# Patient Record
Sex: Male | Born: 1960 | Race: White | Hispanic: No | Marital: Single | Smoking: Never smoker
Health system: Southern US, Community
[De-identification: ages and names within clinical notes are randomized; demographics above are authoritative.]

## PROBLEM LIST (undated history)

## (undated) DIAGNOSIS — R601 Generalized edema: Secondary | ICD-10-CM

## (undated) DIAGNOSIS — N200 Calculus of kidney: Secondary | ICD-10-CM

## (undated) DIAGNOSIS — Z951 Presence of aortocoronary bypass graft: Secondary | ICD-10-CM

## (undated) DIAGNOSIS — M1990 Unspecified osteoarthritis, unspecified site: Secondary | ICD-10-CM

## (undated) DIAGNOSIS — K219 Gastro-esophageal reflux disease without esophagitis: Secondary | ICD-10-CM

## (undated) DIAGNOSIS — I2510 Atherosclerotic heart disease of native coronary artery without angina pectoris: Secondary | ICD-10-CM

## (undated) DIAGNOSIS — I509 Heart failure, unspecified: Secondary | ICD-10-CM

## (undated) DIAGNOSIS — I10 Essential (primary) hypertension: Secondary | ICD-10-CM

## (undated) DIAGNOSIS — I214 Non-ST elevation (NSTEMI) myocardial infarction: Secondary | ICD-10-CM

## (undated) DIAGNOSIS — E669 Obesity, unspecified: Secondary | ICD-10-CM

---

## 2013-04-06 DIAGNOSIS — I214 Non-ST elevation (NSTEMI) myocardial infarction: Secondary | ICD-10-CM

## 2013-04-06 DIAGNOSIS — I10 Essential (primary) hypertension: Secondary | ICD-10-CM | POA: Diagnosis present

## 2013-04-06 HISTORY — DX: Essential (primary) hypertension: I10

## 2013-04-06 HISTORY — DX: Non-ST elevation (NSTEMI) myocardial infarction: I21.4

## 2013-04-07 ENCOUNTER — Other Ambulatory Visit: Payer: Self-pay | Admitting: *Deleted

## 2013-04-07 ENCOUNTER — Inpatient Hospital Stay: Payer: Self-pay | Admitting: Internal Medicine

## 2013-04-07 ENCOUNTER — Encounter (HOSPITAL_COMMUNITY): Payer: Self-pay | Admitting: Physician Assistant

## 2013-04-07 ENCOUNTER — Inpatient Hospital Stay (HOSPITAL_COMMUNITY)
Admission: AD | Admit: 2013-04-07 | Discharge: 2013-04-13 | DRG: 235 | Disposition: A | Discharge status: Home / Self Care | Payer: Self-pay | Source: Other Acute Inpatient Hospital | Attending: Thoracic Surgery (Cardiothoracic Vascular Surgery) | Admitting: Thoracic Surgery (Cardiothoracic Vascular Surgery)

## 2013-04-07 ENCOUNTER — Inpatient Hospital Stay (HOSPITAL_COMMUNITY): Payer: Self-pay

## 2013-04-07 DIAGNOSIS — E785 Hyperlipidemia, unspecified: Secondary | ICD-10-CM | POA: Diagnosis present

## 2013-04-07 DIAGNOSIS — R945 Abnormal results of liver function studies: Secondary | ICD-10-CM

## 2013-04-07 DIAGNOSIS — Z8249 Family history of ischemic heart disease and other diseases of the circulatory system: Secondary | ICD-10-CM

## 2013-04-07 DIAGNOSIS — I2510 Atherosclerotic heart disease of native coronary artery without angina pectoris: Secondary | ICD-10-CM

## 2013-04-07 DIAGNOSIS — I4729 Other ventricular tachycardia: Secondary | ICD-10-CM | POA: Diagnosis not present

## 2013-04-07 DIAGNOSIS — I10 Essential (primary) hypertension: Secondary | ICD-10-CM | POA: Diagnosis present

## 2013-04-07 DIAGNOSIS — I472 Ventricular tachycardia, unspecified: Secondary | ICD-10-CM | POA: Diagnosis not present

## 2013-04-07 DIAGNOSIS — R7989 Other specified abnormal findings of blood chemistry: Secondary | ICD-10-CM

## 2013-04-07 DIAGNOSIS — Z6831 Body mass index (BMI) 31.0-31.9, adult: Secondary | ICD-10-CM

## 2013-04-07 DIAGNOSIS — J9819 Other pulmonary collapse: Secondary | ICD-10-CM | POA: Diagnosis not present

## 2013-04-07 DIAGNOSIS — K219 Gastro-esophageal reflux disease without esophagitis: Secondary | ICD-10-CM | POA: Diagnosis present

## 2013-04-07 DIAGNOSIS — M129 Arthropathy, unspecified: Secondary | ICD-10-CM | POA: Diagnosis present

## 2013-04-07 DIAGNOSIS — N200 Calculus of kidney: Secondary | ICD-10-CM | POA: Insufficient documentation

## 2013-04-07 DIAGNOSIS — D62 Acute posthemorrhagic anemia: Secondary | ICD-10-CM | POA: Diagnosis not present

## 2013-04-07 DIAGNOSIS — I214 Non-ST elevation (NSTEMI) myocardial infarction: Secondary | ICD-10-CM | POA: Diagnosis present

## 2013-04-07 DIAGNOSIS — Z951 Presence of aortocoronary bypass graft: Secondary | ICD-10-CM

## 2013-04-07 DIAGNOSIS — E6609 Other obesity due to excess calories: Secondary | ICD-10-CM | POA: Diagnosis present

## 2013-04-07 DIAGNOSIS — E669 Obesity, unspecified: Secondary | ICD-10-CM | POA: Diagnosis present

## 2013-04-07 HISTORY — DX: Unspecified osteoarthritis, unspecified site: M19.90

## 2013-04-07 HISTORY — DX: Calculus of kidney: N20.0

## 2013-04-07 HISTORY — DX: Obesity, unspecified: E66.9

## 2013-04-07 HISTORY — DX: Non-ST elevation (NSTEMI) myocardial infarction: I21.4

## 2013-04-07 HISTORY — DX: Gastro-esophageal reflux disease without esophagitis: K21.9

## 2013-04-07 HISTORY — DX: Atherosclerotic heart disease of native coronary artery without angina pectoris: I25.10

## 2013-04-07 HISTORY — DX: Presence of aortocoronary bypass graft: Z95.1

## 2013-04-07 HISTORY — DX: Essential (primary) hypertension: I10

## 2013-04-07 LAB — COMPREHENSIVE METABOLIC PANEL
ALBUMIN: 3.8 g/dL (ref 3.5–5.2)
ALT: 67 U/L — ABNORMAL HIGH (ref 0–53)
AST: 36 U/L (ref 15–37)
AST: 368 U/L — AB (ref 0–37)
Albumin: 4.2 g/dL (ref 3.4–5.0)
Alkaline Phosphatase: 75 U/L
Alkaline Phosphatase: 58 U/L (ref 39–117)
Anion Gap: 4 — ABNORMAL LOW (ref 7–16)
BILIRUBIN TOTAL: 0.6 mg/dL (ref 0.2–1.0)
BUN: 18 mg/dL (ref 7–18)
BUN: 10 mg/dL (ref 6–23)
CHLORIDE: 102 meq/L (ref 96–112)
CO2: 24 mEq/L (ref 19–32)
CREATININE: 1.06 mg/dL (ref 0.50–1.35)
Calcium, Total: 9.3 mg/dL (ref 8.5–10.1)
Calcium: 9.2 mg/dL (ref 8.4–10.5)
Chloride: 105 mmol/L (ref 98–107)
Co2: 28 mmol/L (ref 21–32)
Creatinine: 1.17 mg/dL (ref 0.60–1.30)
EGFR (African American): >60
EGFR (Non-African Amer.): >60
GFR calc Af Amer: >90 mL/min (ref >90)
GFR calc non Af Amer: 79 mL/min — ABNORMAL LOW (ref >90)
GLUCOSE: 133 mg/dL — AB (ref 65–99)
Glucose, Bld: 100 mg/dL — ABNORMAL HIGH (ref 70–99)
Osmolality: 278 (ref 275–301)
POTASSIUM: 3.8 mmol/L (ref 3.5–5.1)
POTASSIUM: 4.1 meq/L (ref 3.7–5.3)
SGPT (ALT): 31 U/L (ref 12–78)
Sodium: 137 mmol/L (ref 136–145)
Sodium: 140 mEq/L (ref 137–147)
Total Bilirubin: 0.7 mg/dL (ref 0.3–1.2)
Total Protein: 8 g/dL (ref 6.4–8.2)
Total Protein: 6.7 g/dL (ref 6.0–8.3)

## 2013-04-07 LAB — CK-MB
CK-MB: 12.9 ng/mL — ABNORMAL HIGH (ref 0.5–3.6)
CK-MB: 378 ng/mL — ABNORMAL HIGH (ref 0.5–3.6)
CK-MB: 521.4 ng/mL — ABNORMAL HIGH (ref 0.5–3.6)

## 2013-04-07 LAB — URINALYSIS, ROUTINE W REFLEX MICROSCOPIC
BILIRUBIN URINE: NEGATIVE
GLUCOSE, UA: NEGATIVE mg/dL
HGB URINE DIPSTICK: NEGATIVE
Ketones, ur: NEGATIVE mg/dL
Leukocytes, UA: NEGATIVE
Nitrite: NEGATIVE
PROTEIN: NEGATIVE mg/dL
Specific Gravity, Urine: 1.036 — ABNORMAL HIGH (ref 1.005–1.030)
UROBILINOGEN UA: 0.2 mg/dL (ref 0.0–1.0)
pH: 5.5 (ref 5.0–8.0)

## 2013-04-07 LAB — CBC WITH DIFFERENTIAL/PLATELET
BASOS PCT: 0.6 %
Basophil #: 0.1 10*3/uL (ref 0.0–0.1)
Eosinophil #: 0 10*3/uL (ref 0.0–0.7)
Eosinophil %: 0.4 %
HCT: 47.1 % (ref 40.0–52.0)
HGB: 15.9 g/dL (ref 13.0–18.0)
Lymphocyte #: 1.6 10*3/uL (ref 1.0–3.6)
Lymphocyte %: 14.3 %
MCH: 28.4 pg (ref 26.0–34.0)
MCHC: 33.7 g/dL (ref 32.0–36.0)
MCV: 84 fL (ref 80–100)
MONO ABS: 0.6 x10 3/mm (ref 0.2–1.0)
MONOS PCT: 5.8 %
Neutrophil #: 8.7 10*3/uL — ABNORMAL HIGH (ref 1.4–6.5)
Neutrophil %: 78.9 %
Platelet: 228 10*3/uL (ref 150–440)
RBC: 5.59 10*6/uL (ref 4.40–5.90)
RDW: 13.7 % (ref 11.5–14.5)
WBC: 11 10*3/uL — ABNORMAL HIGH (ref 3.8–10.6)

## 2013-04-07 LAB — CBC
HCT: 50.7 % (ref 40.0–52.0)
HEMATOCRIT: 43.2 % (ref 39.0–52.0)
HGB: 17.2 g/dL (ref 13.0–18.0)
Hemoglobin: 14.9 g/dL (ref 13.0–17.0)
MCH: 28.8 pg (ref 26.0–34.0)
MCH: 29.2 pg (ref 26.0–34.0)
MCHC: 33.9 g/dL (ref 32.0–36.0)
MCHC: 34.5 g/dL (ref 30.0–36.0)
MCV: 85 fL (ref 80–100)
MCV: 84.7 fL (ref 78.0–100.0)
PLATELETS: 190 10*3/uL (ref 150–400)
Platelet: 251 10*3/uL (ref 150–440)
RBC: 5.97 10*6/uL — ABNORMAL HIGH (ref 4.40–5.90)
RBC: 5.1 MIL/uL (ref 4.22–5.81)
RDW: 13.7 % (ref 11.5–14.5)
RDW: 13.5 % (ref 11.5–15.5)
WBC: 9.1 10*3/uL (ref 3.8–10.6)
WBC: 9.4 10*3/uL (ref 4.0–10.5)

## 2013-04-07 LAB — APTT
APTT: 81 s — AB (ref 24–37)
Activated PTT: 75.9 secs — ABNORMAL HIGH (ref 23.6–35.9)

## 2013-04-07 LAB — PROTIME-INR
INR: 1.07 (ref 0.00–1.49)
PROTHROMBIN TIME: 13.7 s (ref 11.6–15.2)

## 2013-04-07 LAB — TROPONIN I: TROPONIN-I: 1.1 ng/mL — AB

## 2013-04-07 LAB — HEPARIN LEVEL (UNFRACTIONATED): Heparin Unfractionated: 0.36 IU/mL (ref 0.30–0.70)

## 2013-04-07 LAB — GLUCOSE, CAPILLARY: Glucose-Capillary: 128 mg/dL — ABNORMAL HIGH (ref 70–99)

## 2013-04-07 MED ORDER — LABETALOL HCL 5 MG/ML IV SOLN
10.0000 mg | INTRAVENOUS | Status: DC | PRN
  Filled 2013-04-07: qty 4

## 2013-04-07 MED ORDER — SODIUM CHLORIDE 0.9 % IV SOLN: 250.0000 mL | INTRAVENOUS | Status: DC | PRN

## 2013-04-07 MED ORDER — ASPIRIN EC 325 MG PO TBEC
325.0000 mg | DELAYED_RELEASE_TABLET | Freq: Every day | ORAL | Status: DC
Start: 2013-04-08 — End: 2013-04-09
  Administered 2013-04-08: 325 mg via ORAL
  Filled 2013-04-07 (×2): qty 1

## 2013-04-07 MED ORDER — ACETAMINOPHEN 325 MG PO TABS
650.0000 mg | ORAL_TABLET | ORAL | Status: DC | PRN
  Administered 2013-04-08: 650 mg via ORAL
  Filled 2013-04-07: qty 2

## 2013-04-07 MED ORDER — ONDANSETRON HCL 4 MG/2ML IJ SOLN: 4.0000 mg | Freq: Four times a day (QID) | INTRAMUSCULAR | Status: DC | PRN

## 2013-04-07 MED ORDER — SODIUM CHLORIDE 0.9 % IJ SOLN: 3.0000 mL | INTRAMUSCULAR | Status: DC | PRN

## 2013-04-07 MED ORDER — NITROGLYCERIN 0.4 MG SL SUBL: 0.4000 mg | SUBLINGUAL_TABLET | SUBLINGUAL | Status: DC | PRN

## 2013-04-07 MED ORDER — ASPIRIN 300 MG RE SUPP
300.0000 mg | RECTAL | Status: DC
  Filled 2013-04-07: qty 1

## 2013-04-07 MED ORDER — ASPIRIN 81 MG PO CHEW: 324.0000 mg | CHEWABLE_TABLET | ORAL | Status: DC

## 2013-04-07 MED ORDER — METOPROLOL TARTRATE 25 MG PO TABS
25.0000 mg | ORAL_TABLET | Freq: Two times a day (BID) | ORAL | Status: DC
  Administered 2013-04-07 – 2013-04-08 (×3): 25 mg via ORAL
  Filled 2013-04-07 (×5): qty 1

## 2013-04-07 MED ORDER — INSULIN ASPART 100 UNIT/ML ~~LOC~~ SOLN: 0.0000 [IU] | Freq: Three times a day (TID) | SUBCUTANEOUS | Status: DC

## 2013-04-07 MED ORDER — ALPRAZOLAM 0.25 MG PO TABS
0.2500 mg | ORAL_TABLET | Freq: Two times a day (BID) | ORAL | Status: DC | PRN
  Administered 2013-04-07: 0.25 mg via ORAL
  Filled 2013-04-07: qty 1

## 2013-04-07 MED ORDER — SODIUM CHLORIDE 0.9 % IJ SOLN
3.0000 mL | Freq: Two times a day (BID) | INTRAMUSCULAR | Status: DC
  Administered 2013-04-07 – 2013-04-08 (×2): 3 mL via INTRAVENOUS

## 2013-04-07 MED ORDER — HEPARIN (PORCINE) IN NACL 100-0.45 UNIT/ML-% IJ SOLN
1550.0000 [IU]/h | INTRAMUSCULAR | Status: DC
  Administered 2013-04-07 – 2013-04-08 (×2): 1550 [IU]/h via INTRAVENOUS
  Filled 2013-04-07 (×2): qty 250

## 2013-04-07 MED ORDER — SIMVASTATIN 20 MG PO TABS
20.0000 mg | ORAL_TABLET | Freq: Every day | ORAL | Status: DC
  Administered 2013-04-07: 20 mg via ORAL
  Filled 2013-04-07 (×2): qty 1

## 2013-04-07 NOTE — Progress Notes (Signed)
ANTICOAGULATION CONSULT NOTE - Initial Consult  Pharmacy Consult for heparin Indication: chest pain/ACS  No Known Allergies  Patient Measurements: Height: 6\' 1"  (185.4 cm) Weight: 235 lb 10.8 oz (106.9 kg) IBW/kg (Calculated) : 79.9 Heparin Dosing Weight: 102 kg  Vital Signs: Temp: 98.7 F (37.1 C) (01/14 1530) Temp src: Oral (01/14 1530) BP: 148/94 mmHg (01/14 1530) Pulse Rate: 83 (01/14 1530)  Labs: No results found for this basename: HGB, HCT, PLT, APTT, LABPROT, INR, HEPARINUNFRC, CREATININE, CKTOTAL, CKMB, TROPONINI,  in the last 72 hours  CrCl is unknown because no creatinine reading has been taken.   Medical History: Past Medical History  Diagnosis Date  . Acid reflux   . Kidney stone   . NSTEMI (non-ST elevated myocardial infarction)   . Arthritis     SHOULDERS  . Coronary artery disease 04/07/2013    cath by Dr. Lady Gary at [facility]  . Hypertension   . Non-STEMI (non-ST elevated myocardial infarction) 04/06/2013  . Essential hypertension, benign 04/06/2013  . Obesity, unspecified 04/07/2013    Medications:  Prescriptions prior to admission  Medication Sig Dispense Refill  . Aspirin-Salicylamide-Caffeine (BC HEADACHE POWDER PO) Take 1 each by mouth as needed.        Assessment: Pt with NSTEMI - transferred from [facility].  Heparin was started there and was continued upon transfer.  Heparin currently running at 15.6 ml/hr.  Pertinent labs from [facility]: Hgb 17.2, plts 251 trop: 1.1. INR pending. No bleeding noted. No anticoagulants prior to admission.  Goal of Therapy:  Heparin level 0.3-0.7 units/ml Monitor platelets by anticoagulation protocol: Yes   Plan:  Adjust heparin to 1550 units/hr  Will draw HL now and adjust as needed Daily HL Daily CBC  Agapito Games, PharmD, BCPS Clinical Pharmacist 04/07/2013 6:25 PM

## 2013-04-07 NOTE — H&P (Addendum)
[address]       [place] [address]             [phone]        Auri Mehrer [facility] Medical Record #[id number] Date of Birth: Aug 03, 1960  Referring: Dr. Mariel Kansky Primary Care: Provider Not In System  Chief Complaint:   Chest Pain    History of Present Illness:     53 year old caucasian male from [place] with a past medical history of hypertension, acid reflux and obesity. He has not been seen by a physician since he was 18. He presented to the ER with chest discomfort for the past 2 weeks associated with shortness of breath. The patient thought that it was just his acid reflux. He noticed the pain after eating initially, but he developed a prolonged episode of chest pain yesterday evening which persisted more than on hour. He denies any associated nausea, diaphoresis or shortness of breath.  The patient took a handful of Rolaids tablets and one BC powder before going to the ER. In the ER the patients BP was 223/130, pulse 64. He was given labetalol 20mg  IV. Initial EKG was fine but subsequent EKGs revealed ST depression in the V leads. At one point the patients EKG showed nonsustained wide complex tachycardia. Troponins were positive. He was diagnosed with a NSTEMI and the patient was given a heparin bolus and started on heparin gtt. After starting a nitro drip and a heparin drip the patients BP began to trend down. He reports no further episodes of chest pain since admission.  He underwent diagnostic cardiac catheterization earlier today by Dr. Lady Gary which revealed severe 3-vessel CAD with preserved left ventricular systolic function.  He was transferred to [facility] for possible elective surgical revascularization.  On arrival at [facility] he denies any chest pain, SOB, headache, blurry vision, and double vision. No family members at bedside.  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 The patient has been out of work recently. Prior to the development of his acute illness the patient reports no physical limitations. He denies any previous history of exertional chest discomfort or shortness of breath. He reports no history of PND, orthopnea, lower extremity edema, palpitations, dizzy spells, nor syncope.  Current Activity/ Functional Status: Patient is independent with mobility/ambulation, transfers, ADL's, IADL's.   Zubrod Score: At the time of surgery this patient's most appropriate activity status/level should be described as: []  Normal activity, no symptoms [x]  Symptoms, fully ambulatory []  Symptoms, in bed less than or equal to 50% of the time []  Symptoms, in bed greater than 50% of the time but less than 100% []  Bedridden []  Moribund  Past Medical History  Diagnosis Date  . Acid reflux   . Kidney stone   . Coronary artery disease   . NSTEMI (non-ST elevated myocardial infarction)     No past surgical history on file.  History  Smoking status  . Never Smoker   Smokeless tobacco  . Not on file    History  Alcohol Use: Not on file    History   Social History  . Marital Status: Unknown    Spouse Name: N/A    Number of Children: N/A  . Years of Education: N/A   Occupational History  . house remodeler     Social History Main Topics  . Smoking status: Never Smoker   .  Smokeless tobacco: Not on file  . Alcohol Use: Not on file  . Drug Use: Not on file  . Sexual Activity: Not on file   Other Topics Concern  . Not on file   Social History Narrative  . No narrative on file    Not on File  No current facility-administered medications for this encounter.    No prescriptions prior to admission    Family History  Problem Relation Age of Onset  . Cancer Mother   . Coronary artery disease Father   . Hypertension Father   . Heart attack Paternal Uncle      Review of Systems:     Cardiac Review of Systems: Y or  N  Chest Pain [  Y  ]  Resting SOB [ N  ] Exertional SOB  [ Y  ]  Orthopnea [  ]   Pedal Edema [   ]    Palpitations [ N ] Syncope  [N  ]   Presyncope [   ]  General Review of Systems: [Y] = yes [  ]=no Constitional: recent weight change [  ]; anorexia [ N ]; fatigue [ N ]; nausea [  ]; night sweats [ N ]; fever [ N ]; or chills [  ]                                                               Dental: poor dentition[  ]; Last Dentist visit:   Eye : blurred vision [ N ]; diplopia [ N  ]; vision changes Klaus.Mock[N  ];  Amaurosis fugax[ N ]; Resp: cough [  ];  wheezing[  ];  hemoptysis[  ]; shortness of breath[ Y ]; paroxysmal nocturnal dyspnea[  ]; dyspnea on exertion[  ]; or orthopnea[  ];  GI:  gallstones[  ], vomiting[  ];  dysphagia[  ]; melena[  ];  hematochezia [  ]; heartburn[ Y ];   Hx of  Colonoscopy[  ]; GU: kidney stones [  ]; hematuria[  ];   dysuria [  ];  nocturia[  ];  history of     obstruction [  ]; urinary frequency [  ]             Skin: rash, swelling[  ];, hair loss[  ];  peripheral edema[  ];  or itching[  ]; Musculosketetal: myalgias[  ];  joint swelling[  ];  joint erythema[  ];  joint pain[  ];  back pain[  ];  Heme/Lymph: bruising[  ];  bleeding[  ];  anemia[  ];  Neuro: TIA[  ];  headaches[  ];  stroke[  ];  vertigo[  ];  seizures[  ];   paresthesias[  ];  difficulty walking[  ];  Psych:depression[  ]; anxiety[  ];  Endocrine: diabetes[ N ];  thyroid dysfunction[ N ];  Immunizations: Flu [  ]; Pneumococcal[  ];  Other:  Physical Exam: BP 148/94  Pulse 83  Temp(Src) 98.7 F (37.1 C) (Oral)  Resp 18  Ht 6\' 1"  (1.854 m)  Wt 106.9 kg (235 lb 10.8 oz)  BMI 31.10 kg/m2  SpO2 98%  General appearance: alert, cooperative and no distress Neurologic: intact Heart: regular rate and rhythm-some skipped beats, S1, S2 normal,  no murmur, click, rub or gallop Lungs: clear to auscultation bilaterally Abdomen: soft, non-tender; bowel sounds normal; no masses,  no  organomegaly Extremities: extremities normal, atraumatic, no cyanosis or edema   Diagnostic Studies & Laboratory data:     Recent Radiology Findings:   No results found.    Recent Lab Findings: No results found for this basename: WBC,  HGB,  HCT,  PLT,  GLUCOSE,  CHOL,  TRIG,  HDL,  LDLDIRECT,  LDLCALC,  ALT,  AST,  NA,  K,  CL,  CREATININE,  BUN,  CO2,  TSH,  INR,  GLUF,  HGBA1C      Assessment / Plan:    1.S/p NSTEMI-put on heparin drip, beta blocker, statin, and ecasa. Has multivessel CAD and will require CABG. Dr. Cornelius Moras to review cardiac catheterization films and further evaluate. 2. Await lab studies, 2D echo, and carotid duplex US  Doree Fudge PA-C  04/07/2013 5:06 PM     I have seen and examined the patient and agree with the assessment and plan as outlined.    Patient is a 52 year old male with history of hypertension, GE reflux disease, obesity and a strong family history of coronary artery disease who presents with recent onset symptoms of angina pectoris culminating in a prolonged episode of chest pain yesterday evening which prompted hospital admission at [facility]. The patient was very hypertensive at the time of admission and has ruled in by cardiac enzymes for non-ST segment elevation myocardial infarction. He has remained free of any chest pain or shortness of breath since admission on intravenous nitroglycerin and heparin. Diagnostic cardiac catheterization performed earlier today demonstrates severe three-vessel coronary artery disease with preserved left ventricular systolic function.  We tentatively plan to proceed with urgent coronary artery bypass grafting on Friday, 04/09/2013. We will continue intravenous heparin, aspirin, beta blocker, and statin therapy. We will check baseline fasting lipid panel and monitor capillary blood glucose values to rule out the possibility of hyperlipidemia and diabetes mellitus.  Will consider adding an ACE inhibitor for further blood  pressure control as needed.  OWEN,CLARENCE H 04/07/2013 5:40 PM

## 2013-04-07 NOTE — Progress Notes (Signed)
Patient's Troponin came back as a critical lab at >20.00.  Notified Dr. Tyrone Sage of the result.  Will continue to monitor.  Arva Chafe

## 2013-04-08 ENCOUNTER — Inpatient Hospital Stay (HOSPITAL_COMMUNITY): Payer: Self-pay

## 2013-04-08 DIAGNOSIS — I10 Essential (primary) hypertension: Secondary | ICD-10-CM

## 2013-04-08 DIAGNOSIS — I517 Cardiomegaly: Secondary | ICD-10-CM

## 2013-04-08 DIAGNOSIS — I2510 Atherosclerotic heart disease of native coronary artery without angina pectoris: Principal | ICD-10-CM

## 2013-04-08 DIAGNOSIS — R7989 Other specified abnormal findings of blood chemistry: Secondary | ICD-10-CM

## 2013-04-08 DIAGNOSIS — E669 Obesity, unspecified: Secondary | ICD-10-CM

## 2013-04-08 DIAGNOSIS — I214 Non-ST elevation (NSTEMI) myocardial infarction: Secondary | ICD-10-CM

## 2013-04-08 LAB — CBC
HCT: 40.5 % (ref 39.0–52.0)
Hemoglobin: 13.8 g/dL (ref 13.0–17.0)
MCH: 29.7 pg (ref 26.0–34.0)
MCHC: 34.1 g/dL (ref 30.0–36.0)
MCV: 87.1 fL (ref 78.0–100.0)
Platelets: 160 10*3/uL (ref 150–400)
RBC: 4.65 MIL/uL (ref 4.22–5.81)
RDW: 13.9 % (ref 11.5–15.5)
WBC: 7.8 10*3/uL (ref 4.0–10.5)

## 2013-04-08 LAB — BLOOD GAS, ARTERIAL
ACID-BASE EXCESS: 0.1 mmol/L (ref 0.0–2.0)
Bicarbonate: 24.2 mEq/L — ABNORMAL HIGH (ref 20.0–24.0)
DRAWN BY: 246101
FIO2: 0.21 %
O2 Saturation: 96.2 %
PO2 ART: 82.2 mmHg (ref 80.0–100.0)
Patient temperature: 98.6
TCO2: 25.4 mmol/L (ref 0–100)
pCO2 arterial: 39.1 mmHg (ref 35.0–45.0)
pH, Arterial: 7.409 (ref 7.350–7.450)

## 2013-04-08 LAB — PULMONARY FUNCTION TEST
DL/VA % pred: 119 %
DL/VA: 5.63 ml/min/mmHg/L
DLCO UNC: 35.69 ml/min/mmHg
DLCO cor % pred: 108 %
DLCO cor: 36.54 ml/min/mmHg
DLCO unc % pred: 105 %
FEF 25-75 Post: 5.32 L/sec
FEF 25-75 Pre: 4.38 L/sec
FEF2575-%Change-Post: 21 %
FEF2575-%Pred-Post: 155 %
FEF2575-%Pred-Pre: 127 %
FEV1-%CHANGE-POST: 5 %
FEV1-%PRED-POST: 97 %
FEV1-%PRED-PRE: 92 %
FEV1-POST: 3.87 L
FEV1-Pre: 3.66 L
FEV1FVC-%Change-Post: -2 %
FEV1FVC-%Pred-Pre: 110 %
FEV6-%Change-Post: 4 %
FEV6-%Pred-Post: 90 %
FEV6-%Pred-Pre: 86 %
FEV6-PRE: 4.28 L
FEV6-Post: 4.46 L
FEV6FVC-%PRED-PRE: 104 %
FEV6FVC-%Pred-Post: 104 %
FVC-%Change-Post: 8 %
FVC-%Pred-Post: 90 %
FVC-%Pred-Pre: 83 %
FVC-Post: 4.66 L
FVC-Pre: 4.31 L
POST FEV1/FVC RATIO: 83 %
POST FEV6/FVC RATIO: 100 %
PRE FEV6/FVC RATIO: 100 %
Pre FEV1/FVC ratio: 85 %
RV % PRED: 54 %
RV: 1.17 L
TLC % PRED: 81 %
TLC: 5.86 L

## 2013-04-08 LAB — GLUCOSE, CAPILLARY
GLUCOSE-CAPILLARY: 93 mg/dL (ref 70–99)
GLUCOSE-CAPILLARY: 99 mg/dL (ref 70–99)
GLUCOSE-CAPILLARY: 81 mg/dL (ref 70–99)
Glucose-Capillary: 102 mg/dL — ABNORMAL HIGH (ref 70–99)
Glucose-Capillary: 91 mg/dL (ref 70–99)

## 2013-04-08 LAB — TYPE AND SCREEN
ABO/RH(D): O NEG
ANTIBODY SCREEN: NEGATIVE

## 2013-04-08 LAB — LIPID PANEL
Cholesterol: 171 mg/dL (ref 0–200)
HDL: 31 mg/dL — ABNORMAL LOW (ref >39)
LDL Cholesterol: 116 mg/dL — ABNORMAL HIGH (ref 0–99)
Total CHOL/HDL Ratio: 5.5 RATIO
Triglycerides: 122 mg/dL (ref <150)
VLDL: 24 mg/dL (ref 0–40)

## 2013-04-08 LAB — HEMOGLOBIN A1C
Hgb A1c MFr Bld: 5.7 % — ABNORMAL HIGH (ref <5.7)
Mean Plasma Glucose: 117 mg/dL — ABNORMAL HIGH (ref <117)

## 2013-04-08 LAB — TROPONIN I: Troponin I: >20 ng/mL (ref <0.30)

## 2013-04-08 LAB — HEPARIN LEVEL (UNFRACTIONATED): Heparin Unfractionated: 0.54 IU/mL (ref 0.30–0.70)

## 2013-04-08 LAB — SURGICAL PCR SCREEN
MRSA, PCR: NEGATIVE
Staphylococcus aureus: POSITIVE — AB

## 2013-04-08 LAB — ABO/RH: ABO/RH(D): O NEG

## 2013-04-08 MED ORDER — ALBUTEROL SULFATE (2.5 MG/3ML) 0.083% IN NEBU
2.5000 mg | INHALATION_SOLUTION | Freq: Once | RESPIRATORY_TRACT | Status: AC
  Administered 2013-04-08: 2.5 mg via RESPIRATORY_TRACT

## 2013-04-08 MED ORDER — CHLORHEXIDINE GLUCONATE 4 % EX LIQD
60.0000 mL | Freq: Once | CUTANEOUS | Status: AC
  Administered 2013-04-08: 4 via TOPICAL
  Filled 2013-04-08: qty 60

## 2013-04-08 MED ORDER — METOPROLOL TARTRATE 12.5 MG HALF TABLET
12.5000 mg | ORAL_TABLET | Freq: Once | ORAL | Status: AC
  Administered 2013-04-09: 12.5 mg via ORAL
  Filled 2013-04-08: qty 1

## 2013-04-08 MED ORDER — CHLORHEXIDINE GLUCONATE 4 % EX LIQD
60.0000 mL | Freq: Once | CUTANEOUS | Status: AC
  Administered 2013-04-09: 4 via TOPICAL
  Filled 2013-04-08 (×2): qty 60

## 2013-04-08 MED ORDER — MAGNESIUM SULFATE 50 % IJ SOLN
40.0000 meq | INTRAMUSCULAR | Status: DC
  Filled 2013-04-08: qty 10

## 2013-04-08 MED ORDER — PHENYLEPHRINE HCL 10 MG/ML IJ SOLN
30.0000 ug/min | INTRAVENOUS | Status: AC
  Administered 2013-04-09: 25 ug/min via INTRAVENOUS
  Filled 2013-04-08: qty 2

## 2013-04-08 MED ORDER — MUPIROCIN 2 % EX OINT
1.0000 "application " | TOPICAL_OINTMENT | Freq: Two times a day (BID) | CUTANEOUS | Status: DC
  Administered 2013-04-08: 1 via NASAL
  Filled 2013-04-08: qty 22

## 2013-04-08 MED ORDER — TEMAZEPAM 15 MG PO CAPS
15.0000 mg | ORAL_CAPSULE | Freq: Once | ORAL | Status: AC | PRN
  Administered 2013-04-08: 15 mg via ORAL
  Filled 2013-04-08: qty 1

## 2013-04-08 MED ORDER — DEXTROSE 5 % IV SOLN
1.5000 g | INTRAVENOUS | Status: AC
  Administered 2013-04-09: .75 g via INTRAVENOUS
  Administered 2013-04-09: 1.5 g via INTRAVENOUS
  Filled 2013-04-08: qty 1.5

## 2013-04-08 MED ORDER — DEXMEDETOMIDINE HCL IN NACL 400 MCG/100ML IV SOLN
0.1000 ug/kg/h | INTRAVENOUS | Status: AC
  Administered 2013-04-09: 0.2 ug/kg/h via INTRAVENOUS
  Filled 2013-04-08: qty 100

## 2013-04-08 MED ORDER — VANCOMYCIN HCL 1000 MG IV SOLR
INTRAVENOUS | Status: AC
  Administered 2013-04-09: 09:00:00
  Filled 2013-04-08: qty 1000

## 2013-04-08 MED ORDER — EPINEPHRINE HCL 1 MG/ML IJ SOLN
0.5000 ug/min | INTRAVENOUS | Status: DC
  Filled 2013-04-08: qty 4

## 2013-04-08 MED ORDER — PLASMA-LYTE 148 IV SOLN
INTRAVENOUS | Status: AC
  Administered 2013-04-09: 09:00:00
  Filled 2013-04-08: qty 2.5

## 2013-04-08 MED ORDER — LISINOPRIL 10 MG PO TABS
10.0000 mg | ORAL_TABLET | Freq: Every day | ORAL | Status: DC
  Administered 2013-04-08: 10 mg via ORAL
  Filled 2013-04-08 (×2): qty 1

## 2013-04-08 MED ORDER — SODIUM CHLORIDE 0.9 % IV SOLN
INTRAVENOUS | Status: AC
  Administered 2013-04-09: .5 [IU]/h via INTRAVENOUS
  Filled 2013-04-08: qty 1

## 2013-04-08 MED ORDER — SODIUM CHLORIDE 0.9 % IV SOLN
INTRAVENOUS | Status: DC
  Filled 2013-04-08: qty 40

## 2013-04-08 MED ORDER — POTASSIUM CHLORIDE 2 MEQ/ML IV SOLN
80.0000 meq | INTRAVENOUS | Status: DC
  Filled 2013-04-08: qty 40

## 2013-04-08 MED ORDER — DEXTROSE 5 % IV SOLN
750.0000 mg | INTRAVENOUS | Status: DC
  Filled 2013-04-08 (×2): qty 750

## 2013-04-08 MED ORDER — ATORVASTATIN CALCIUM 80 MG PO TABS
80.0000 mg | ORAL_TABLET | Freq: Every day | ORAL | Status: DC
  Administered 2013-04-08: 80 mg via ORAL
  Filled 2013-04-08 (×2): qty 1

## 2013-04-08 MED ORDER — VANCOMYCIN HCL 10 G IV SOLR
1250.0000 mg | INTRAVENOUS | Status: AC
  Administered 2013-04-09: 1250 mg via INTRAVENOUS
  Filled 2013-04-08: qty 1250

## 2013-04-08 MED ORDER — NITROGLYCERIN IN D5W 200-5 MCG/ML-% IV SOLN
2.0000 ug/min | INTRAVENOUS | Status: DC
Start: 2013-04-09 — End: 2013-04-09
  Filled 2013-04-08: qty 250

## 2013-04-08 MED ORDER — SODIUM CHLORIDE 0.9 % IV SOLN
INTRAVENOUS | Status: DC
  Filled 2013-04-08: qty 30

## 2013-04-08 MED ORDER — BISACODYL 5 MG PO TBEC
5.0000 mg | DELAYED_RELEASE_TABLET | Freq: Once | ORAL | Status: AC
  Administered 2013-04-08: 5 mg via ORAL
  Filled 2013-04-08: qty 1

## 2013-04-08 MED ORDER — DOPAMINE-DEXTROSE 3.2-5 MG/ML-% IV SOLN
2.0000 ug/kg/min | INTRAVENOUS | Status: DC
  Filled 2013-04-08: qty 250

## 2013-04-08 MED ORDER — CHLORHEXIDINE GLUCONATE CLOTH 2 % EX PADS
6.0000 | MEDICATED_PAD | Freq: Every day | CUTANEOUS | Status: DC
  Administered 2013-04-08: 6 via TOPICAL

## 2013-04-08 MED FILL — Heparin Sodium (Porcine) 100 Unt/ML in Sodium Chloride 0.45%: INTRAMUSCULAR | Qty: 250 | Status: AC

## 2013-04-08 NOTE — Progress Notes (Signed)
UR Completed.  Berlynn Warsame Jane [phone] 04/08/2013  

## 2013-04-08 NOTE — Consult Note (Signed)
Admit date: 04/07/2013 Referring Physician  Dr. Cornelius Moras Primary Physician Provider Not In System Primary Cardiologist  Dr. Mariel Kansky Reason for Consultation  NSTEMI (Troponin >91)   HPI: 53 year old male patient of Dr. Lady Gary transferred here from [facility] for bypass surgery. Has non-ST elevation myocardial infarction with troponin greater than 20.  He has a history of hypertension, GERD, obesity and strong family history of CAD with recent symptoms of new onset angina with chest pain occurring on 04/06/13 prompting hospital admission at [facility]. He was quite hypertensive, troponins elevated, cardiac catheterization revealed severe three-vessel disease with normal EF. Case was discussed with Dr. Cornelius Moras, he was transferred. Tentatively, plan is for bypass surgery on Friday 04/09/13.  Currently, chest pain-free without any significant shortness of breath. The pressure is under much better control. Troponins remained significantly elevated on all 3 readings of greater than 20.  Prior EKGs were reviewed and when EKG demonstrates sinus rhythm with wide complex QRS, delta wave formation. Subsequent EKG shows normal QRS duration. Telemetry here at approximately 5 AM demonstrates 8 beats of nonsustained ventricular tachycardia. Asymptomatic.    PMH:   Past Medical History  Diagnosis Date  . Acid reflux   . Kidney stone   . NSTEMI (non-ST elevated myocardial infarction)   . Arthritis     SHOULDERS  . Coronary artery disease 04/07/2013    cath by Dr. Lady Gary at [facility]  . Hypertension   . Non-STEMI (non-ST elevated myocardial infarction) 04/06/2013  . Essential hypertension, benign 04/06/2013  . Obesity, unspecified 04/07/2013    PSH:  History reviewed. No pertinent past surgical history. Allergies:  Review of patient's allergies indicates no known allergies. Prior to Admit Meds:   Prior to Admission medications   Medication Sig Start Date End Date Taking? Authorizing Provider    Aspirin-Salicylamide-Caffeine (BC HEADACHE POWDER PO) Take 1 each by mouth as needed.   Yes Historical Provider, MD   Current medications scheduled: . aspirin EC  325 mg Oral Daily  . insulin aspart  0-15 Units Subcutaneous TID WC  . lisinopril  10 mg Oral Daily  . metoprolol tartrate  25 mg Oral BID  . simvastatin  20 mg Oral q1800  . sodium chloride  3 mL Intravenous Q12H   Fam HX:    Family History  Problem Relation Age of Onset  . Cancer Mother   . Coronary artery disease Father   . Hypertension Father   . Heart attack Paternal Uncle    Social HX:    History   Social History  . Marital Status: Unknown    Spouse Name: N/A    Number of Children: N/A  . 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     Social History Main Topics  . Smoking status: Never Smoker   . Smokeless tobacco: Former Neurosurgeon     Comment: iI  DIPPED TOBACCO AT ONE TOME"  . Alcohol Use: No  . Drug Use: No  . Sexual Activity: Not on file   Other Topics Concern  . 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 All 11 ROS were addressed and are negative except what is stated in the HPI   Physical Exam: Blood pressure 136/78, pulse 70, temperature 98.3  F (36.8 C), temperature source Oral, resp. rate 20, height 6\' 1"  (1.854 m), weight 235 lb 10.8 oz (106.9 kg), SpO2 96.00%.   General: Well developed, well nourished, in no acute distress Head: Eyes PERRLA, No xanthomas.   Normal cephalic and atramatic Skin slightly ruddy appearance Lungs:   Clear bilaterally to auscultation and percussion. Normal respiratory effort. No wheezes, no rales. Heart:   HRRR S1 S2 Pulses are 2+ & equal. No murmur, rubs, gallops.  No carotid bruit. No JVD.  No abdominal bruits.  Abdomen: Bowel sounds are positive, abdomen  soft and non-tender without masses. No hepatosplenomegaly. Obese Msk:  Back normal. Normal strength and tone for age. Extremities:  No clubbing, cyanosis or edema.  DP +1 Neuro: Alert and oriented X 3, non-focal, MAE x 4 GU: Deferred Rectal: Deferred Psych:  Good affect, responds appropriately, somewhat somber mood      Labs: Lab Results  Component Value Date   WBC 7.8 04/08/2013   HGB 13.8 04/08/2013   HCT 40.5 04/08/2013   MCV 87.1 04/08/2013   PLT 160 04/08/2013     Recent Labs Lab 04/07/13 1852  NA 140  K 4.1  CL 102  CO2 24  BUN 10  CREATININE 1.06  CALCIUM 9.2  PROT 6.7  BILITOT 0.7  ALKPHOS 58  ALT 67*  AST 368*  GLUCOSE 100*    Recent Labs  04/07/13 1852 04/08/13 0053 04/08/13 0558  TROPONINI >20.00* >20.00* >20.00*   Lab Results  Component Value Date   CHOL 171 04/08/2013   HDL 31* 04/08/2013   LDLCALC 116* 04/08/2013   TRIG 122 04/08/2013   No results found for this basename: DDIMER   hemoglobin A1c 5.7.  ALT 67, AST 368  Radiology:  X-ray Chest Pa And Lateral  04/07/2013   CLINICAL DATA:  Coronary artery disease  EXAM: CHEST  2 VIEW  COMPARISON:  April 07, 2013  FINDINGS: The lungs are clear. Heart size and pulmonary vascularity are normal. No adenopathy. No bone lesions.  IMPRESSION: No abnormality noted.   Electronically Signed   By: Bretta Bang M.D.   On: 04/07/2013 21:21   Personally viewed.  EKG:   04/08/13 at 6:30 AM-sinus rhythm, possibly 1 mm of ST segment elevation in 1, aVL with associated T wave inversion in 1, aVL, inferior leads 2, aVF as well as lateral leads V5, V6. There is 2 mm of J-point elevation in lead V2. Personally viewed.   Cardiac catheterization 04/07/13-Dr. Fath - 70% mid to distal RCA stenosis, 50% proximal LAD, 70% first diagonal, 99% obtuse marginal 2 stenosis, large caliber vessel. Normal EF.   ASSESSMENT/PLAN:   53 year old male with severe three-vessel coronary artery disease awaiting bypass surgery with highly  elevated troponin of greater than 20 in the setting of non-ST elevation myocardial infarction, cardiac catheterization on 04/07/13 by Dr. Lady Gary.  1. NSTEMI - highly elevated troponin. Currently chest pain-free. EKG demonstrates ischemic changes. Recent catheterization performed. Continue with IV heparinization, aspirin, metoprolol, statin (I will change to atorvastatin 80, high-dose in the setting of severe CAD, MI). Currently chest pain-free. With highly elevated troponin, there may be a degree of myopericarditis in the setting of NSTEMI (however, no current chest pain, no fevers). Echocardiogram currently pending. Previous EF normal.   Catheterization revealed 70% mid to distal RCA stenosis, 50% proximal LAD, 70% first diagonal, 99% obtuse marginal 2 stenosis, large caliber vessel.  It is possible that 99% obtuse marginal stenosis, categorized a fairly large caliber vessel, may  be transiently occluding resulting in highly elevated troponin.  2. Obesity encourage weight loss  3. Hyperlipidemia-changing statin therapy to atorvastatin 80.  4. Hypertension-currently well controlled, beta blocker, ACE inhibitor.  5. Elevated liver enzymes-this may be secondary to current MI. Consider fatty liver disease with obesity. Continue to monitor especially in the setting of statin. ALT is not 3 times the upper limit of normal.  We will follow along.  Once patient is discharged, he should followup with Dr. Lady GaryFath.  Joshua [name], Joshua Mraz, MD  04/08/2013  10:12 AM

## 2013-04-08 NOTE — Progress Notes (Addendum)
       [address]       [name] [address]             [phone]            Procedure(s) (LRB): CORONARY ARTERY BYPASS GRAFTING (CABG) (N/A) INTRAOPERATIVE TRANSESOPHAGEAL ECHOCARDIOGRAM (N/A)  Subjective: Comfortable, no chest pain or SOB.    Objective: Vital signs in last 24 hours: Patient Vitals for the past 24 hrs:  BP Temp Temp src Pulse Resp SpO2 Height Weight  04/08/13 0614 136/78 mmHg 98.3 F (36.8 C) Oral 70 20 96 % - -  04/07/13 2143 155/95 mmHg 98.9 F (37.2 C) Oral 92 18 96 % - -  04/07/13 1530 148/94 mmHg 98.7 F (37.1 C) Oral 83 18 98 % 6\' 1"  (1.854 m) 235 lb 10.8 oz (106.9 kg)   Current Weight  04/07/13 235 lb 10.8 oz (106.9 kg)     Intake/Output from previous day: 01/14 0701 - 01/15 0700 In: 554.3 [P.O.:240; I.V.:314.3] Out: 500 [Urine:500]    PHYSICAL EXAM:  Heart: RRR Lungs: Clear Abdomen: Soft, NT/ND, +BS Extremities: Warm, no edema    Lab Results: CBC: Recent Labs  04/07/13 1852 04/08/13 0558  WBC 9.4 7.8  HGB 14.9 13.8  HCT 43.2 40.5  PLT 190 160   BMET:  Recent Labs  04/07/13 1852  NA 140  K 4.1  CL 102  CO2 24  GLUCOSE 100*  BUN 10  CREATININE 1.06  CALCIUM 9.2    PT/INR:  Recent Labs  04/07/13 1852  LABPROT 13.7  INR 1.07   CXR: EXAM:  CHEST 2 VIEW  COMPARISON: April 07, 2013  FINDINGS:  The lungs are clear. Heart size and pulmonary vascularity are  normal. No adenopathy. No bone lesions.  IMPRESSION:  No abnormality noted.  A1C=5.7  U/A negative  Troponin I >20  APTT 81  Lipid Panel     Component Value Date/Time   CHOL 171 04/08/2013 0558   TRIG 122 04/08/2013 0558   HDL 31* 04/08/2013 0558   CHOLHDL 5.5 04/08/2013 0558   VLDL 24 04/08/2013 0558   LDLCALC 116* 04/08/2013 0558      Assessment/Plan: S/P Procedure(s) (LRB): CORONARY ARTERY BYPASS GRAFTING (CABG) (N/A) INTRAOPERATIVE TRANSESOPHAGEAL ECHOCARDIOGRAM (N/A) NSTEMI, CAD- stable at present, no CP or SOB.   For CABG in am by Dr. Cornelius Moras. Awaiting Dopplers, echo, etc.   LOS: 1 day    COLLINS,GINA H 04/08/2013  I have seen and examined the patient and agree with the assessment and plan as outlined.  Will start ACE-I and ask Cardiology to see him due to very high Troponin levels.  Check ECHO  OWEN,CLARENCE H 04/08/2013

## 2013-04-08 NOTE — Progress Notes (Signed)
Pre-op Cardiac Surgery  Carotid Findings:  Bilateral:  1-39% ICA stenosis.  Vertebral artery flow is antegrade.      Upper Extremity Right Left  Brachial Pressures 147 146  Radial Waveforms Tri Tri  Ulnar Waveforms Tri Tri  Palmar Arch (Allen's Test) Obliterates with radial compression, Normal with ulnar compression Obliterates with radial compression, Normal with ulnar compression   Palpable pedal pulses.     Farrel Demark, RDMS, RVT 04/08/2013

## 2013-04-08 NOTE — Progress Notes (Signed)
ANTICOAGULATION CONSULT NOTE  Pharmacy Consult for heparin Indication: chest pain/ACS  No Known Allergies  Patient Measurements: Height: 6\' 1"  (185.4 cm) Weight: 235 lb 10.8 oz (106.9 kg) IBW/kg (Calculated) : 79.9 Heparin Dosing Weight: 102 kg  Vital Signs: Temp: 98.3 F (36.8 C) (01/15 0614) Temp src: Oral (01/15 0614) BP: 136/78 mmHg (01/15 0614) Pulse Rate: 70 (01/15 0614)  Labs:  Recent Labs  04/07/13 1852 04/08/13 0053 04/08/13 0558  HGB 14.9  --  13.8  HCT 43.2  --  40.5  PLT 190  --  160  APTT 81*  --   --   LABPROT 13.7  --   --   INR 1.07  --   --   HEPARINUNFRC 0.36  --  0.54  CREATININE 1.06  --   --   TROPONINI >20.00* >20.00* >20.00*    Estimated Creatinine Clearance: 104.6 ml/min (by C-G formula based on Cr of 1.06).   Assessment: 52 YOM withNSTEMI transferred from [facility] yesterday evening. Had undergone cath earlier in the day which revealed severe 3 vessel disease- planning for CABG on 1/16. Heparin was started at [facility] and was continued upon transfer. Heparin level therapeutic x2 with most recent level 0.52units/mL. Hgb and Plts remain wnl, no bleeding noted.  Goal of Therapy:  Heparin level 0.3-0.7 units/ml Monitor platelets by anticoagulation protocol: Yes   Plan:  1. Continue hepatin at 1550 units/hr 2. Daily heparin level and CBC 3. Follow for any changes to CABG plans 4. Follow for need for long term anticoagulation 5. Follow for s/s bleeding  Mckynlie Vanderslice D. Sri Clegg, PharmD, BCPS Clinical Pharmacist Pager: [phone] 04/08/2013 8:40 AM

## 2013-04-08 NOTE — Progress Notes (Signed)
Nursing note  Patient with Staff positive  + surgical swab, Gabrille Kilbride, Randall An RN

## 2013-04-08 NOTE — Progress Notes (Signed)
Echocardiogram completed.

## 2013-04-08 NOTE — Progress Notes (Signed)
CSW received consult that patient is needing assistance with medications. CSW will pass this along to the Case Manager who will help assist patient with medications. CSW signing off. Please re consult if social work needs arise.  Maree Krabbe, MSW, Theresia Majors [phone]

## 2013-04-08 NOTE — Care Management Note (Unsigned)
    Page 1 of 1   04/08/2013     3:31:07 PM   CARE MANAGEMENT NOTE 04/08/2013  Patient:  Joshua [name], Joshua [name]   Account Number:  [id number]  Date Initiated:  04/08/2013  Documentation initiated by:  Samara Stankowski  Subjective/Objective Assessment:   PT ADM WITH NSTEMI/3 VESSEL DISEASE ON 1/14.  PTA, PT INDEPENDENT, LIVES ALONE.     Action/Plan:   WILL FOLLOW FOR DISCHARGE NEEDS AS PT PROGRESSES.  MAY NEED ST-SNF DEPENDING ON HOME SUPPORT.  SCHEDULED FOR CABG ON 04/09/13.   Anticipated DC Date:  04/15/2013   Anticipated DC Plan:        DC Planning Services  CM consult      Choice offered to / List presented to:             Status of service:  In process, will continue to follow Medicare Important Message given?   (If response is "NO", the following Medicare IM given date fields will be blank) Date Medicare IM given:   Date Additional Medicare IM given:    Discharge Disposition:    Per UR Regulation:  Reviewed for med. necessity/level of care/duration of stay  If discussed at Long Length of Stay Meetings, dates discussed:    Comments:

## 2013-04-08 NOTE — Progress Notes (Signed)
[id number] Cardiac Rehab Completed pre-op education with pt. He voices understanding. Pt is very tearful and anxious. Gave pt emotional support and let him voice his fears. We discussed sternal precautions, walking post-op and use of IS. Pt has heart surgery booklet and pt care guide. Pt states that he can stay with his father after surgery for 24/7 care and that will be his plan since he lives alone. We will follow postop as ordered. Beatrix Fetters, RN 04/08/2013 2:30 PM

## 2013-04-08 NOTE — Progress Notes (Signed)
Nursing note Patient expressing anxiety about surgery in the am, education done with patient on OHS patient given booklet and patient care guide and IS. Will continue to monitor patient. Bryann Gentz, Randall An RN

## 2013-04-08 NOTE — Progress Notes (Signed)
TCTS BRIEF PROGRESS NOTE   I have reviewed the indications, risks, and potential benefits of coronary artery bypass grafting with the patient again this afternoon.  Alternative treatment strategies have been discussed.  The patient understands and accepts all potential associated risks of surgery including but not limited to risk of death, stroke or other neurologic complication, myocardial infarction, congestive heart failure, respiratory failure, renal failure, bleeding requiring blood transfusion and/or reexploration, aortic dissection or other major vascular complication, arrhythmia, heart block or bradycardia requiring permanent pacemaker, pneumonia, pleural effusion, wound infection, pulmonary embolus or other thromboembolic complication, chronic pain or other delayed complications related to median sternotomy, or the late recurrence of symptomatic ischemic heart disease and/or congestive heart failure.  The importance of long term risk modification have been emphasized.  All questions answered.  For OR in AM.   [name],Joshua H 04/08/2013 6:07 PM

## 2013-04-08 NOTE — Anesthesia Preprocedure Evaluation (Addendum)
Anesthesia Evaluation  Patient identified by MRN, date of birth, ID band Patient awake    Reviewed: Allergy & Precautions, H&P , NPO status , Patient's Chart, lab work & pertinent test results, reviewed documented beta blocker date and time   History of Anesthesia Complications Negative for: history of anesthetic complications  Airway Mallampati: III TM Distance: >3 FB Neck ROM: Full  Mouth opening: Limited Mouth Opening  Dental  (+) Dental Advisory Given,    Pulmonary neg pulmonary ROS,  breath sounds clear to auscultation  Pulmonary exam normal       Cardiovascular Exercise Tolerance: Good hypertension, Pt. on medications + CAD (severe 3v ASCADz with preserved LVF) and + Past MI Rhythm:Regular Rate:Normal  ECHO 04-08-13  - Left ventricle: The cavity size was normal. There was   moderate concentric hypertrophy. Systolic function was   normal. The estimated ejection fraction was in the range   of 55% to 60%. There is moderate hypokinesis of the   inferoseptal myocardium. Doppler parameters are consistent   with abnormal left ventricular relaxation (grade 1   diastolic dysfunction).    Neuro/Psych Anxiety negative neurological ROS     GI/Hepatic GERD-  Medicated and Controlled,Elevated LFTs with acute MI   Endo/Other  negative endocrine ROS  Renal/GU Renal diseasenegative Renal ROS     Musculoskeletal   Abdominal Normal abdominal exam  (+) + obese,   Peds  Hematology negative hematology ROS (+)   Anesthesia Other Findings Metoprolol 12.5 mgs PO @ 5am 04-09-13  Reproductive/Obstetrics                      Anesthesia Physical Anesthesia Plan  ASA: III  Anesthesia Plan: General   Post-op Pain Management:    Induction: Intravenous  Airway Management Planned: Oral ETT  Additional Equipment: Arterial line, CVP, PA Cath, 3D TEE and Ultrasound Guidance Line Placement  Intra-op Plan:    Post-operative Plan: Post-operative intubation/ventilation  Informed Consent: I have reviewed the patients History and Physical, chart, labs and discussed the procedure including the risks, benefits and alternatives for the proposed anesthesia with the patient or authorized representative who has indicated his/her understanding and acceptance.   Dental advisory given  Plan Discussed with: CRNA and Surgeon  Anesthesia Plan Comments: (Plan routine monitors, A line, PA cath, GETA with post op ventilation )       Anesthesia Quick Evaluation

## 2013-04-09 ENCOUNTER — Inpatient Hospital Stay (HOSPITAL_COMMUNITY): Payer: Self-pay | Admitting: Anesthesiology

## 2013-04-09 ENCOUNTER — Encounter (HOSPITAL_COMMUNITY): Payer: MEDICAID | Admitting: Anesthesiology

## 2013-04-09 ENCOUNTER — Inpatient Hospital Stay: Admit: 2013-04-09 | Payer: Self-pay | Admitting: Thoracic Surgery (Cardiothoracic Vascular Surgery)

## 2013-04-09 ENCOUNTER — Encounter (HOSPITAL_COMMUNITY): Payer: Self-pay | Admitting: Anesthesiology

## 2013-04-09 ENCOUNTER — Inpatient Hospital Stay (HOSPITAL_COMMUNITY): Payer: Self-pay

## 2013-04-09 ENCOUNTER — Inpatient Hospital Stay (HOSPITAL_COMMUNITY): Payer: MEDICAID

## 2013-04-09 ENCOUNTER — Encounter (HOSPITAL_COMMUNITY)
Admission: AD | Disposition: A | Discharge status: Home / Self Care | Payer: Self-pay | Source: Other Acute Inpatient Hospital | Attending: Thoracic Surgery (Cardiothoracic Vascular Surgery)

## 2013-04-09 DIAGNOSIS — I2510 Atherosclerotic heart disease of native coronary artery without angina pectoris: Secondary | ICD-10-CM

## 2013-04-09 DIAGNOSIS — Z951 Presence of aortocoronary bypass graft: Secondary | ICD-10-CM

## 2013-04-09 HISTORY — DX: Presence of aortocoronary bypass graft: Z95.1

## 2013-04-09 HISTORY — PX: INTRAOPERATIVE TRANSESOPHAGEAL ECHOCARDIOGRAM: SHX5062

## 2013-04-09 HISTORY — PX: CORONARY ARTERY BYPASS GRAFT: SHX141

## 2013-04-09 LAB — CBC
HCT: 41.4 % (ref 39.0–52.0)
HCT: 34.2 % — ABNORMAL LOW (ref 39.0–52.0)
HEMATOCRIT: 35.2 % — AB (ref 39.0–52.0)
HEMOGLOBIN: 11.3 g/dL — AB (ref 13.0–17.0)
Hemoglobin: 13.9 g/dL (ref 13.0–17.0)
Hemoglobin: 12 g/dL — ABNORMAL LOW (ref 13.0–17.0)
MCH: 29.3 pg (ref 26.0–34.0)
MCH: 29.1 pg (ref 26.0–34.0)
MCH: 28.8 pg (ref 26.0–34.0)
MCHC: 33.6 g/dL (ref 30.0–36.0)
MCHC: 34.1 g/dL (ref 30.0–36.0)
MCHC: 33 g/dL (ref 30.0–36.0)
MCV: 87.2 fL (ref 78.0–100.0)
MCV: 85.4 fL (ref 78.0–100.0)
MCV: 87 fL (ref 78.0–100.0)
PLATELETS: 132 10*3/uL — AB (ref 150–400)
Platelets: 179 10*3/uL (ref 150–400)
Platelets: 137 10*3/uL — ABNORMAL LOW (ref 150–400)
RBC: 4.75 MIL/uL (ref 4.22–5.81)
RBC: 4.12 MIL/uL — ABNORMAL LOW (ref 4.22–5.81)
RBC: 3.93 MIL/uL — AB (ref 4.22–5.81)
RDW: 13.8 % (ref 11.5–15.5)
RDW: 13.6 % (ref 11.5–15.5)
RDW: 13.7 % (ref 11.5–15.5)
WBC: 7.5 10*3/uL (ref 4.0–10.5)
WBC: 10.5 10*3/uL (ref 4.0–10.5)
WBC: 8.5 10*3/uL (ref 4.0–10.5)

## 2013-04-09 LAB — POCT I-STAT 3, VENOUS BLOOD GAS (G3P V)
ACID-BASE DEFICIT: 2 mmol/L (ref 0.0–2.0)
BICARBONATE: 24.1 meq/L — AB (ref 20.0–24.0)
O2 SAT: 79 %
TCO2: 25 mmol/L (ref 0–100)
pCO2, Ven: 44.4 mmHg — ABNORMAL LOW (ref 45.0–50.0)
pH, Ven: 7.343 — ABNORMAL HIGH (ref 7.250–7.300)
pO2, Ven: 46 mmHg — ABNORMAL HIGH (ref 30.0–45.0)

## 2013-04-09 LAB — POCT I-STAT 4, (NA,K, GLUC, HGB,HCT)
GLUCOSE: 115 mg/dL — AB (ref 70–99)
GLUCOSE: 140 mg/dL — AB (ref 70–99)
Glucose, Bld: 95 mg/dL (ref 70–99)
Glucose, Bld: 102 mg/dL — ABNORMAL HIGH (ref 70–99)
Glucose, Bld: 143 mg/dL — ABNORMAL HIGH (ref 70–99)
Glucose, Bld: 159 mg/dL — ABNORMAL HIGH (ref 70–99)
HCT: 39 % (ref 39.0–52.0)
HCT: 30 % — ABNORMAL LOW (ref 39.0–52.0)
HCT: 36 % — ABNORMAL LOW (ref 39.0–52.0)
HCT: 29 % — ABNORMAL LOW (ref 39.0–52.0)
HCT: 31 % — ABNORMAL LOW (ref 39.0–52.0)
HCT: 30 % — ABNORMAL LOW (ref 39.0–52.0)
HEMOGLOBIN: 10.5 g/dL — AB (ref 13.0–17.0)
Hemoglobin: 13.3 g/dL (ref 13.0–17.0)
Hemoglobin: 10.2 g/dL — ABNORMAL LOW (ref 13.0–17.0)
Hemoglobin: 12.2 g/dL — ABNORMAL LOW (ref 13.0–17.0)
Hemoglobin: 9.9 g/dL — ABNORMAL LOW (ref 13.0–17.0)
Hemoglobin: 10.2 g/dL — ABNORMAL LOW (ref 13.0–17.0)
POTASSIUM: 4.2 meq/L (ref 3.7–5.3)
POTASSIUM: 6.2 meq/L — AB (ref 3.7–5.3)
Potassium: 4 mEq/L (ref 3.7–5.3)
Potassium: 4.2 mEq/L (ref 3.7–5.3)
Potassium: 6 mEq/L — ABNORMAL HIGH (ref 3.7–5.3)
Potassium: 5.4 mEq/L — ABNORMAL HIGH (ref 3.7–5.3)
SODIUM: 142 meq/L (ref 137–147)
SODIUM: 136 meq/L — AB (ref 137–147)
Sodium: 138 mEq/L (ref 137–147)
Sodium: 139 mEq/L (ref 137–147)
Sodium: 134 mEq/L — ABNORMAL LOW (ref 137–147)
Sodium: 135 mEq/L — ABNORMAL LOW (ref 137–147)

## 2013-04-09 LAB — APTT: aPTT: 27 seconds (ref 24–37)

## 2013-04-09 LAB — CREATININE, SERUM
Creatinine, Ser: 1.04 mg/dL (ref 0.50–1.35)
GFR calc Af Amer: >90 mL/min (ref >90)
GFR, EST NON AFRICAN AMERICAN: 81 mL/min — AB (ref >90)

## 2013-04-09 LAB — POCT I-STAT 3, ART BLOOD GAS (G3+)
Acid-Base Excess: 1 mmol/L (ref 0.0–2.0)
Acid-base deficit: 2 mmol/L (ref 0.0–2.0)
BICARBONATE: 25.4 meq/L — AB (ref 20.0–24.0)
BICARBONATE: 24.6 meq/L — AB (ref 20.0–24.0)
Bicarbonate: 27.6 mEq/L — ABNORMAL HIGH (ref 20.0–24.0)
Bicarbonate: 24.1 mEq/L — ABNORMAL HIGH (ref 20.0–24.0)
Bicarbonate: 25 mEq/L — ABNORMAL HIGH (ref 20.0–24.0)
O2 SAT: 100 %
O2 Saturation: 100 %
O2 Saturation: 100 %
O2 Saturation: 92 %
O2 Saturation: 97 %
PCO2 ART: 40.1 mmHg (ref 35.0–45.0)
PCO2 ART: 40.8 mmHg (ref 35.0–45.0)
PH ART: 7.353 (ref 7.350–7.450)
PH ART: 7.396 (ref 7.350–7.450)
PO2 ART: 405 mmHg — AB (ref 80.0–100.0)
PO2 ART: 272 mmHg — AB (ref 80.0–100.0)
PO2 ART: 89 mmHg (ref 80.0–100.0)
Patient temperature: 35
Patient temperature: 37.2
TCO2: 29 mmol/L (ref 0–100)
TCO2: 27 mmol/L (ref 0–100)
TCO2: 26 mmol/L (ref 0–100)
TCO2: 25 mmol/L (ref 0–100)
TCO2: 26 mmol/L (ref 0–100)
pCO2 arterial: 57.7 mmHg (ref 35.0–45.0)
pCO2 arterial: 40.8 mmHg (ref 35.0–45.0)
pCO2 arterial: 42.4 mmHg (ref 35.0–45.0)
pH, Arterial: 7.288 — ABNORMAL LOW (ref 7.350–7.450)
pH, Arterial: 7.41 (ref 7.350–7.450)
pH, Arterial: 7.389 (ref 7.350–7.450)
pO2, Arterial: 326 mmHg — ABNORMAL HIGH (ref 80.0–100.0)
pO2, Arterial: 59 mmHg — ABNORMAL LOW (ref 80.0–100.0)

## 2013-04-09 LAB — BASIC METABOLIC PANEL
BUN: 12 mg/dL (ref 6–23)
CO2: 23 meq/L (ref 19–32)
CREATININE: 1.06 mg/dL (ref 0.50–1.35)
Calcium: 8.9 mg/dL (ref 8.4–10.5)
Chloride: 105 mEq/L (ref 96–112)
GFR calc non Af Amer: 79 mL/min — ABNORMAL LOW (ref >90)
Glucose, Bld: 85 mg/dL (ref 70–99)
Potassium: 3.9 mEq/L (ref 3.7–5.3)
Sodium: 143 mEq/L (ref 137–147)

## 2013-04-09 LAB — PLATELET COUNT: Platelets: 160 10*3/uL (ref 150–400)

## 2013-04-09 LAB — GLUCOSE, CAPILLARY: GLUCOSE-CAPILLARY: 101 mg/dL — AB (ref 70–99)

## 2013-04-09 LAB — POCT I-STAT, CHEM 8
BUN: 13 mg/dL (ref 6–23)
CHLORIDE: 107 meq/L (ref 96–112)
Calcium, Ion: 1.21 mmol/L (ref 1.12–1.23)
Creatinine, Ser: 1.1 mg/dL (ref 0.50–1.35)
Glucose, Bld: 111 mg/dL — ABNORMAL HIGH (ref 70–99)
HCT: 30 % — ABNORMAL LOW (ref 39.0–52.0)
Hemoglobin: 10.2 g/dL — ABNORMAL LOW (ref 13.0–17.0)
POTASSIUM: 4.3 meq/L (ref 3.7–5.3)
SODIUM: 139 meq/L (ref 137–147)
TCO2: 25 mmol/L (ref 0–100)

## 2013-04-09 LAB — MAGNESIUM: MAGNESIUM: 3 mg/dL — AB (ref 1.5–2.5)

## 2013-04-09 LAB — PROTIME-INR
INR: 1.36 (ref 0.00–1.49)
Prothrombin Time: 16.4 seconds — ABNORMAL HIGH (ref 11.6–15.2)

## 2013-04-09 LAB — HEMOGLOBIN AND HEMATOCRIT, BLOOD
HEMATOCRIT: 31 % — AB (ref 39.0–52.0)
HEMOGLOBIN: 10.4 g/dL — AB (ref 13.0–17.0)

## 2013-04-09 SURGERY — CORONARY ARTERY BYPASS GRAFTING (CABG)
Anesthesia: General | Site: Chest

## 2013-04-09 MED ORDER — SODIUM CHLORIDE 0.45 % IV SOLN
INTRAVENOUS | Status: DC
  Administered 2013-04-09: 15:00:00 via INTRAVENOUS

## 2013-04-09 MED ORDER — MUPIROCIN 2 % EX OINT
1.0000 "application " | TOPICAL_OINTMENT | Freq: Two times a day (BID) | CUTANEOUS | Status: DC
  Administered 2013-04-09 – 2013-04-13 (×8): 1 via NASAL
  Filled 2013-04-09: qty 22

## 2013-04-09 MED ORDER — SODIUM CHLORIDE 0.9 % IV SOLN
INTRAVENOUS | Status: DC
  Filled 2013-04-09: qty 1

## 2013-04-09 MED ORDER — MORPHINE SULFATE 2 MG/ML IJ SOLN
1.0000 mg | INTRAMUSCULAR | Status: AC | PRN
  Administered 2013-04-09 – 2013-04-10 (×4): 2 mg via INTRAVENOUS
  Filled 2013-04-09: qty 1
  Filled 2013-04-09 (×2): qty 2

## 2013-04-09 MED ORDER — MAGNESIUM SULFATE 40 MG/ML IJ SOLN
4.0000 g | Freq: Once | INTRAMUSCULAR | Status: AC
  Administered 2013-04-09: 4 g via INTRAVENOUS
  Filled 2013-04-09: qty 100

## 2013-04-09 MED ORDER — ASPIRIN EC 325 MG PO TBEC
325.0000 mg | DELAYED_RELEASE_TABLET | Freq: Every day | ORAL | Status: DC
  Administered 2013-04-10 – 2013-04-13 (×4): 325 mg via ORAL
  Filled 2013-04-09 (×4): qty 1

## 2013-04-09 MED ORDER — MILRINONE IN DEXTROSE 20 MG/100ML IV SOLN
0.1250 ug/kg/min | INTRAVENOUS | Status: DC
  Filled 2013-04-09: qty 100

## 2013-04-09 MED ORDER — DOCUSATE SODIUM 100 MG PO CAPS
200.0000 mg | ORAL_CAPSULE | Freq: Every day | ORAL | Status: DC
  Administered 2013-04-10 – 2013-04-13 (×4): 200 mg via ORAL
  Filled 2013-04-09 (×4): qty 2

## 2013-04-09 MED ORDER — NITROGLYCERIN IN D5W 200-5 MCG/ML-% IV SOLN
INTRAVENOUS | Status: DC | PRN
  Administered 2013-04-09: 15 ug/min via INTRAVENOUS

## 2013-04-09 MED ORDER — PROPOFOL 10 MG/ML IV BOLUS
INTRAVENOUS | Status: DC | PRN
  Administered 2013-04-09: 30 mg via INTRAVENOUS
  Administered 2013-04-09: 20 mg via INTRAVENOUS
  Administered 2013-04-09: 50 mg via INTRAVENOUS

## 2013-04-09 MED ORDER — CHLORHEXIDINE GLUCONATE CLOTH 2 % EX PADS
6.0000 | MEDICATED_PAD | Freq: Every day | CUTANEOUS | Status: DC
  Administered 2013-04-10 – 2013-04-11 (×2): 6 via TOPICAL

## 2013-04-09 MED ORDER — POTASSIUM CHLORIDE 10 MEQ/50ML IV SOLN: 10.0000 meq | INTRAVENOUS | Status: AC

## 2013-04-09 MED ORDER — ACETAMINOPHEN 500 MG PO TABS
1000.0000 mg | ORAL_TABLET | Freq: Four times a day (QID) | ORAL | Status: DC
  Administered 2013-04-09 – 2013-04-13 (×13): 1000 mg via ORAL
  Filled 2013-04-09 (×18): qty 2

## 2013-04-09 MED ORDER — ALBUMIN HUMAN 5 % IV SOLN
INTRAVENOUS | Status: DC | PRN
  Administered 2013-04-09: 14:00:00 via INTRAVENOUS

## 2013-04-09 MED ORDER — DEXTROSE 5 % IV SOLN
1.5000 g | Freq: Two times a day (BID) | INTRAVENOUS | Status: AC
  Administered 2013-04-09 – 2013-04-11 (×4): 1.5 g via INTRAVENOUS
  Filled 2013-04-09 (×4): qty 1.5

## 2013-04-09 MED ORDER — LACTATED RINGERS IV SOLN
INTRAVENOUS | Status: DC | PRN
Start: 2013-04-09 — End: 2013-04-09
  Administered 2013-04-09: 07:00:00 via INTRAVENOUS

## 2013-04-09 MED ORDER — LACTATED RINGERS IV SOLN
INTRAVENOUS | Status: DC | PRN
  Administered 2013-04-09: 07:00:00 via INTRAVENOUS

## 2013-04-09 MED ORDER — SODIUM CHLORIDE 0.9 % IJ SOLN
OROMUCOSAL | Status: DC | PRN
  Administered 2013-04-09 (×3): via TOPICAL

## 2013-04-09 MED ORDER — SODIUM CHLORIDE 0.9 % IJ SOLN
3.0000 mL | Freq: Two times a day (BID) | INTRAMUSCULAR | Status: DC
  Administered 2013-04-10: 10:00:00 via INTRAVENOUS
  Administered 2013-04-10: 3 mL via INTRAVENOUS

## 2013-04-09 MED ORDER — 0.9 % SODIUM CHLORIDE (POUR BTL) OPTIME
TOPICAL | Status: DC | PRN
  Administered 2013-04-09: 6000 mL

## 2013-04-09 MED ORDER — BISACODYL 10 MG RE SUPP: 10.0000 mg | Freq: Every day | RECTAL | Status: DC

## 2013-04-09 MED ORDER — LACTATED RINGERS IV SOLN: 500.0000 mL | Freq: Once | INTRAVENOUS | Status: AC | PRN

## 2013-04-09 MED ORDER — PROTAMINE SULFATE 10 MG/ML IV SOLN
INTRAVENOUS | Status: DC | PRN
  Administered 2013-04-09: 150 mg via INTRAVENOUS
  Administered 2013-04-09: 20 mg via INTRAVENOUS
  Administered 2013-04-09: 200 mg via INTRAVENOUS

## 2013-04-09 MED ORDER — SODIUM CHLORIDE 0.9 % IV SOLN
INTRAVENOUS | Status: DC | PRN
  Administered 2013-04-09: 07:00:00 via INTRAVENOUS

## 2013-04-09 MED ORDER — BISACODYL 5 MG PO TBEC
10.0000 mg | DELAYED_RELEASE_TABLET | Freq: Every day | ORAL | Status: DC
  Administered 2013-04-10 – 2013-04-13 (×4): 10 mg via ORAL
  Filled 2013-04-09 (×4): qty 2

## 2013-04-09 MED ORDER — PANTOPRAZOLE SODIUM 40 MG PO TBEC
40.0000 mg | DELAYED_RELEASE_TABLET | Freq: Every day | ORAL | Status: DC
  Administered 2013-04-11 – 2013-04-13 (×3): 40 mg via ORAL
  Filled 2013-04-09 (×3): qty 1

## 2013-04-09 MED ORDER — METOPROLOL TARTRATE 1 MG/ML IV SOLN: 2.5000 mg | INTRAVENOUS | Status: DC | PRN

## 2013-04-09 MED ORDER — VANCOMYCIN HCL IN DEXTROSE 1-5 GM/200ML-% IV SOLN
1000.0000 mg | Freq: Once | INTRAVENOUS | Status: AC
  Administered 2013-04-09: 1000 mg via INTRAVENOUS
  Filled 2013-04-09: qty 200

## 2013-04-09 MED ORDER — MILRINONE IN DEXTROSE 20 MG/100ML IV SOLN
0.0000 ug/kg/min | INTRAVENOUS | Status: DC
  Administered 2013-04-09 – 2013-04-10 (×2): 0.3 ug/kg/min via INTRAVENOUS
  Filled 2013-04-09 (×3): qty 100

## 2013-04-09 MED ORDER — MIDAZOLAM HCL 5 MG/5ML IJ SOLN
INTRAMUSCULAR | Status: DC | PRN
  Administered 2013-04-09 (×2): 3 mg via INTRAVENOUS
  Administered 2013-04-09: 2 mg via INTRAVENOUS
  Administered 2013-04-09: 1 mg via INTRAVENOUS
  Administered 2013-04-09: 3 mg via INTRAVENOUS
  Administered 2013-04-09: 2 mg via INTRAVENOUS
  Administered 2013-04-09 (×2): 1 mg via INTRAVENOUS
  Administered 2013-04-09: 4 mg via INTRAVENOUS

## 2013-04-09 MED ORDER — ONDANSETRON HCL 4 MG/2ML IJ SOLN: 4.0000 mg | Freq: Four times a day (QID) | INTRAMUSCULAR | Status: DC | PRN

## 2013-04-09 MED ORDER — LACTATED RINGERS IV SOLN: INTRAVENOUS | Status: DC

## 2013-04-09 MED ORDER — PHENYLEPHRINE HCL 10 MG/ML IJ SOLN
0.0000 ug/min | INTRAVENOUS | Status: DC
  Filled 2013-04-09: qty 2

## 2013-04-09 MED ORDER — HEPARIN SODIUM (PORCINE) 1000 UNIT/ML IJ SOLN
INTRAMUSCULAR | Status: DC | PRN
Start: 2013-04-09 — End: 2013-04-09
  Administered 2013-04-09: 3000 [IU] via INTRAVENOUS
  Administered 2013-04-09: 40000 [IU] via INTRAVENOUS

## 2013-04-09 MED ORDER — VECURONIUM BROMIDE 10 MG IV SOLR
INTRAVENOUS | Status: DC | PRN
  Administered 2013-04-09: 5 mg via INTRAVENOUS
  Administered 2013-04-09 (×2): 3 mg via INTRAVENOUS
  Administered 2013-04-09: 4 mg via INTRAVENOUS
  Administered 2013-04-09: 5 mg via INTRAVENOUS

## 2013-04-09 MED ORDER — ASPIRIN 81 MG PO CHEW: 324.0000 mg | CHEWABLE_TABLET | Freq: Every day | ORAL | Status: DC

## 2013-04-09 MED ORDER — ALBUMIN HUMAN 5 % IV SOLN
250.0000 mL | INTRAVENOUS | Status: AC | PRN
  Administered 2013-04-09: 250 mL via INTRAVENOUS

## 2013-04-09 MED ORDER — MILRINONE IN DEXTROSE 20 MG/100ML IV SOLN
INTRAVENOUS | Status: DC | PRN
  Administered 2013-04-09: 0.25 ug/kg/min via INTRAVENOUS

## 2013-04-09 MED ORDER — INSULIN REGULAR BOLUS VIA INFUSION
0.0000 [IU] | Freq: Three times a day (TID) | INTRAVENOUS | Status: DC
  Administered 2013-04-10: 2 [IU] via INTRAVENOUS
  Filled 2013-04-09: qty 10

## 2013-04-09 MED ORDER — ACETAMINOPHEN 160 MG/5ML PO SOLN: 1000.0000 mg | Freq: Four times a day (QID) | ORAL | Status: DC

## 2013-04-09 MED ORDER — FENTANYL CITRATE 0.05 MG/ML IJ SOLN
INTRAMUSCULAR | Status: DC | PRN
  Administered 2013-04-09: 250 ug via INTRAVENOUS
  Administered 2013-04-09 (×2): 50 ug via INTRAVENOUS
  Administered 2013-04-09: 750 ug via INTRAVENOUS
  Administered 2013-04-09: 50 ug via INTRAVENOUS
  Administered 2013-04-09: 250 ug via INTRAVENOUS
  Administered 2013-04-09: 100 ug via INTRAVENOUS

## 2013-04-09 MED ORDER — MORPHINE SULFATE 2 MG/ML IJ SOLN
2.0000 mg | INTRAMUSCULAR | Status: DC | PRN
  Administered 2013-04-10: 2 mg via INTRAVENOUS
  Administered 2013-04-10: 4 mg via INTRAVENOUS
  Administered 2013-04-10: 2 mg via INTRAVENOUS
  Filled 2013-04-09: qty 2
  Filled 2013-04-09: qty 1

## 2013-04-09 MED ORDER — MIDAZOLAM HCL 2 MG/2ML IJ SOLN: 2.0000 mg | INTRAMUSCULAR | Status: DC | PRN

## 2013-04-09 MED ORDER — OXYCODONE HCL 5 MG PO TABS
5.0000 mg | ORAL_TABLET | ORAL | Status: DC | PRN
  Administered 2013-04-10 – 2013-04-11 (×4): 10 mg via ORAL
  Administered 2013-04-11: 5 mg via ORAL
  Administered 2013-04-11 (×2): 10 mg via ORAL
  Administered 2013-04-12: 5 mg via ORAL
  Administered 2013-04-13 (×2): 10 mg via ORAL
  Filled 2013-04-09 (×10): qty 2

## 2013-04-09 MED ORDER — SODIUM CHLORIDE 0.9 % IV SOLN
250.0000 mL | INTRAVENOUS | Status: DC
  Administered 2013-04-10: 250 mL via INTRAVENOUS

## 2013-04-09 MED ORDER — ACETAMINOPHEN 160 MG/5ML PO SOLN
650.0000 mg | Freq: Once | ORAL | Status: AC
  Administered 2013-04-09: 650 mg

## 2013-04-09 MED ORDER — SODIUM CHLORIDE 0.9 % IJ SOLN
3.0000 mL | INTRAMUSCULAR | Status: DC | PRN
Start: 2013-04-10 — End: 2013-04-11

## 2013-04-09 MED ORDER — ACETAMINOPHEN 650 MG RE SUPP: 650.0000 mg | Freq: Once | RECTAL | Status: AC

## 2013-04-09 MED ORDER — DEXMEDETOMIDINE HCL IN NACL 200 MCG/50ML IV SOLN
0.1000 ug/kg/h | INTRAVENOUS | Status: DC
  Administered 2013-04-09: 0.4 ug/kg/h via INTRAVENOUS
  Administered 2013-04-10: 0.1 ug/kg/h via INTRAVENOUS
  Filled 2013-04-09 (×2): qty 50

## 2013-04-09 MED ORDER — METOPROLOL TARTRATE 25 MG/10 ML ORAL SUSPENSION
12.5000 mg | Freq: Two times a day (BID) | ORAL | Status: DC
  Filled 2013-04-09 (×3): qty 5

## 2013-04-09 MED ORDER — SODIUM CHLORIDE 0.9 % IV SOLN
10.0000 g | INTRAVENOUS | Status: DC | PRN
  Administered 2013-04-09: 5 g/h via INTRAVENOUS

## 2013-04-09 MED ORDER — ARTIFICIAL TEARS OP OINT
TOPICAL_OINTMENT | OPHTHALMIC | Status: DC | PRN
  Administered 2013-04-09: 1 via OPHTHALMIC

## 2013-04-09 MED ORDER — SODIUM CHLORIDE 0.9 % IV SOLN
INTRAVENOUS | Status: AC
  Administered 2013-04-09: 15:00:00 via INTRAVENOUS

## 2013-04-09 MED ORDER — FAMOTIDINE IN NACL 20-0.9 MG/50ML-% IV SOLN
20.0000 mg | Freq: Two times a day (BID) | INTRAVENOUS | Status: AC
  Administered 2013-04-09 (×2): 20 mg via INTRAVENOUS
  Filled 2013-04-09: qty 50

## 2013-04-09 MED ORDER — NITROGLYCERIN IN D5W 200-5 MCG/ML-% IV SOLN: 0.0000 ug/min | INTRAVENOUS | Status: DC

## 2013-04-09 MED ORDER — ROCURONIUM BROMIDE 100 MG/10ML IV SOLN
INTRAVENOUS | Status: DC | PRN
  Administered 2013-04-09 (×2): 50 mg via INTRAVENOUS

## 2013-04-09 MED ORDER — METOPROLOL TARTRATE 12.5 MG HALF TABLET
12.5000 mg | ORAL_TABLET | Freq: Two times a day (BID) | ORAL | Status: DC
  Administered 2013-04-10: 12.5 mg via ORAL
  Filled 2013-04-09 (×3): qty 1

## 2013-04-09 SURGICAL SUPPLY — 129 items
ADAPTER CARDIO PERF ANTE/RETRO (ADAPTER) ×3 IMPLANT
APPLIER CLIP 9.375 MED OPEN (MISCELLANEOUS)
APPLIER CLIP 9.375 SM OPEN (CLIP) ×3
ATTRACTOMAT 16X20 MAGNETIC DRP (DRAPES) ×3 IMPLANT
BAG DECANTER FOR FLEXI CONT (MISCELLANEOUS) ×3 IMPLANT
BANDAGE ELASTIC 4 VELCRO ST LF (GAUZE/BANDAGES/DRESSINGS) ×6 IMPLANT
BANDAGE ELASTIC 6 VELCRO ST LF (GAUZE/BANDAGES/DRESSINGS) ×6 IMPLANT
BANDAGE GAUZE ELAST BULKY 4 IN (GAUZE/BANDAGES/DRESSINGS) ×3 IMPLANT
BASKET HEART (ORDER IN 25'S) (MISCELLANEOUS) ×1
BASKET HEART (ORDER IN 25S) (MISCELLANEOUS) ×2 IMPLANT
BENZOIN TINCTURE PRP APPL 2/3 (GAUZE/BANDAGES/DRESSINGS) ×6 IMPLANT
BLADE STERNUM SYSTEM 6 (BLADE) ×3 IMPLANT
BLADE SURG 11 STRL SS (BLADE) ×3 IMPLANT
BLADE SURG ROTATE 9660 (MISCELLANEOUS) ×3 IMPLANT
BNDG GAUZE ELAST 4 BULKY (GAUZE/BANDAGES/DRESSINGS) ×6 IMPLANT
CANISTER SUCTION 2500CC (MISCELLANEOUS) ×3 IMPLANT
CANNULA EZ GLIDE AORTIC 21FR (CANNULA) ×3 IMPLANT
CANNULA GUNDRY RCSP 15FR (MISCELLANEOUS) ×3 IMPLANT
CANNULA VENOUS LOW PROF 34X46 (CANNULA) ×3 IMPLANT
CANNULA VESSEL W/WING W/VALVE (CANNULA) ×3 IMPLANT
CARDIAC SUCTION (MISCELLANEOUS) ×3 IMPLANT
CATH CPB KIT OWEN (MISCELLANEOUS) ×3 IMPLANT
CATH THORACIC 28FR (CATHETERS) IMPLANT
CATH THORACIC 28FR RT ANG (CATHETERS) IMPLANT
CATH THORACIC 36FR (CATHETERS) ×3 IMPLANT
CATH THORACIC 36FR RT ANG (CATHETERS) ×3 IMPLANT
CLIP APPLIE 9.375 MED OPEN (MISCELLANEOUS) IMPLANT
CLIP APPLIE 9.375 SM OPEN (CLIP) ×2 IMPLANT
CLIP FOGARTY SPRING 6M (CLIP) IMPLANT
CLIP RETRACTION 3.0MM CORONARY (MISCELLANEOUS) ×3 IMPLANT
CLIP TI MEDIUM 24 (CLIP) IMPLANT
CLIP TI WIDE RED SMALL 24 (CLIP) IMPLANT
CONN ST 1/4X3/8  BEN (MISCELLANEOUS) ×2
CONN ST 1/4X3/8 BEN (MISCELLANEOUS) ×4 IMPLANT
CONN Y 3/8X3/8X3/8  BEN (MISCELLANEOUS)
CONN Y 3/8X3/8X3/8 BEN (MISCELLANEOUS) IMPLANT
COVER SURGICAL LIGHT HANDLE (MISCELLANEOUS) ×3 IMPLANT
CRADLE DONUT ADULT HEAD (MISCELLANEOUS) ×3 IMPLANT
DERMABOND ADVANCED (GAUZE/BANDAGES/DRESSINGS) ×1
DERMABOND ADVANCED .7 DNX12 (GAUZE/BANDAGES/DRESSINGS) ×2 IMPLANT
DRAIN CHANNEL 32F RND 10.7 FF (WOUND CARE) ×9 IMPLANT
DRAPE CARDIOVASCULAR INCISE (DRAPES) ×1
DRAPE INCISE IOBAN 66X45 STRL (DRAPES) ×3 IMPLANT
DRAPE SLUSH/WARMER DISC (DRAPES) ×3 IMPLANT
DRAPE SRG 135X102X78XABS (DRAPES) ×2 IMPLANT
DRSG COVADERM 4X14 (GAUZE/BANDAGES/DRESSINGS) ×3 IMPLANT
ELECT BLADE 4.0 EZ CLEAN MEGAD (MISCELLANEOUS) ×3
ELECT REM PT RETURN 9FT ADLT (ELECTROSURGICAL) ×6
ELECTRODE BLDE 4.0 EZ CLN MEGD (MISCELLANEOUS) ×2 IMPLANT
ELECTRODE REM PT RTRN 9FT ADLT (ELECTROSURGICAL) ×4 IMPLANT
GLOVE BIO SURGEON STRL SZ 6 (GLOVE) ×6 IMPLANT
GLOVE BIO SURGEON STRL SZ 6.5 (GLOVE) ×9 IMPLANT
GLOVE BIO SURGEON STRL SZ7 (GLOVE) ×3 IMPLANT
GLOVE BIO SURGEON STRL SZ7.5 (GLOVE) IMPLANT
GLOVE BIOGEL PI IND STRL 6 (GLOVE) IMPLANT
GLOVE BIOGEL PI IND STRL 6.5 (GLOVE) ×8 IMPLANT
GLOVE BIOGEL PI IND STRL 7.0 (GLOVE) IMPLANT
GLOVE BIOGEL PI INDICATOR 6 (GLOVE)
GLOVE BIOGEL PI INDICATOR 6.5 (GLOVE) ×4
GLOVE BIOGEL PI INDICATOR 7.0 (GLOVE)
GLOVE EUDERMIC 7 POWDERFREE (GLOVE) IMPLANT
GLOVE ORTHO TXT STRL SZ7.5 (GLOVE) ×6 IMPLANT
GOWN STRL NON-REIN LRG LVL3 (GOWN DISPOSABLE) ×21 IMPLANT
HEMOSTAT POWDER SURGIFOAM 1G (HEMOSTASIS) ×9 IMPLANT
INSERT FOGARTY 61MM (MISCELLANEOUS) IMPLANT
INSERT FOGARTY XLG (MISCELLANEOUS) ×3 IMPLANT
KIT BASIN OR (CUSTOM PROCEDURE TRAY) ×3 IMPLANT
KIT ROOM TURNOVER OR (KITS) ×3 IMPLANT
KIT SUCTION CATH 14FR (SUCTIONS) ×15 IMPLANT
KIT VASOVIEW W/TROCAR VH 2000 (KITS) ×3 IMPLANT
LEAD PACING MYOCARDI (MISCELLANEOUS) ×3 IMPLANT
MARKER GRAFT CORONARY BYPASS (MISCELLANEOUS) ×9 IMPLANT
NS IRRIG 1000ML POUR BTL (IV SOLUTION) ×18 IMPLANT
PACK OPEN HEART (CUSTOM PROCEDURE TRAY) ×3 IMPLANT
PAD ARMBOARD 7.5X6 YLW CONV (MISCELLANEOUS) ×3 IMPLANT
PAD ELECT DEFIB RADIOL ZOLL (MISCELLANEOUS) ×3 IMPLANT
PENCIL BUTTON HOLSTER BLD 10FT (ELECTRODE) ×3 IMPLANT
PUNCH AORTIC ROTATE 4.0MM (MISCELLANEOUS) ×3 IMPLANT
PUNCH AORTIC ROTATE 4.5MM 8IN (MISCELLANEOUS) IMPLANT
PUNCH AORTIC ROTATE 5MM 8IN (MISCELLANEOUS) IMPLANT
SET CARDIOPLEGIA MPS 5001102 (MISCELLANEOUS) ×3 IMPLANT
SOLUTION ANTI FOG 6CC (MISCELLANEOUS) IMPLANT
SPONGE GAUZE 4X4 12PLY (GAUZE/BANDAGES/DRESSINGS) ×6 IMPLANT
SPONGE GAUZE 4X4 12PLY STER LF (GAUZE/BANDAGES/DRESSINGS) ×9 IMPLANT
SPONGE LAP 18X18 X RAY DECT (DISPOSABLE) IMPLANT
SPONGE LAP 4X18 X RAY DECT (DISPOSABLE) IMPLANT
STRIP CLOSURE SKIN 1/2X4 (GAUZE/BANDAGES/DRESSINGS) ×3 IMPLANT
SUT BONE WAX W31G (SUTURE) ×3 IMPLANT
SUT ETHIBOND X763 2 0 SH 1 (SUTURE) ×6 IMPLANT
SUT MNCRL AB 3-0 PS2 18 (SUTURE) ×6 IMPLANT
SUT MNCRL AB 4-0 PS2 18 (SUTURE) ×6 IMPLANT
SUT PDS AB 1 CTX 36 (SUTURE) ×6 IMPLANT
SUT PROLENE 2 0 SH DA (SUTURE) IMPLANT
SUT PROLENE 3 0 SH DA (SUTURE) ×9 IMPLANT
SUT PROLENE 3 0 SH1 36 (SUTURE) ×3 IMPLANT
SUT PROLENE 4 0 RB 1 (SUTURE)
SUT PROLENE 4 0 SH DA (SUTURE) ×3 IMPLANT
SUT PROLENE 4-0 RB1 .5 CRCL 36 (SUTURE) IMPLANT
SUT PROLENE 5 0 C 1 36 (SUTURE) IMPLANT
SUT PROLENE 6 0 C 1 30 (SUTURE) ×6 IMPLANT
SUT PROLENE 7.0 RB 3 (SUTURE) ×21 IMPLANT
SUT PROLENE 8 0 BV175 6 (SUTURE) ×9 IMPLANT
SUT PROLENE BLUE 7 0 (SUTURE) ×3 IMPLANT
SUT PROLENE POLY MONO (SUTURE) ×18 IMPLANT
SUT SILK  1 MH (SUTURE) ×2
SUT SILK 1 MH (SUTURE) ×4 IMPLANT
SUT STEEL 6MS V (SUTURE) IMPLANT
SUT STEEL STERNAL CCS#1 18IN (SUTURE) ×3 IMPLANT
SUT STEEL SZ 6 DBL 3X14 BALL (SUTURE) ×6 IMPLANT
SUT VIC AB 1 CTX 36 (SUTURE)
SUT VIC AB 1 CTX36XBRD ANBCTR (SUTURE) IMPLANT
SUT VIC AB 2-0 CT1 27 (SUTURE) ×2
SUT VIC AB 2-0 CT1 TAPERPNT 27 (SUTURE) ×4 IMPLANT
SUT VIC AB 2-0 CTX 27 (SUTURE) IMPLANT
SUT VIC AB 3-0 SH 27 (SUTURE)
SUT VIC AB 3-0 SH 27X BRD (SUTURE) IMPLANT
SUT VIC AB 3-0 X1 27 (SUTURE) IMPLANT
SUT VICRYL 4-0 PS2 18IN ABS (SUTURE) IMPLANT
SUTURE E-PAK OPEN HEART (SUTURE) ×3 IMPLANT
SYR 50ML SLIP (SYRINGE) ×3 IMPLANT
SYSTEM SAHARA CHEST DRAIN ATS (WOUND CARE) ×3 IMPLANT
TAPE CLOTH SURG 4X10 WHT LF (GAUZE/BANDAGES/DRESSINGS) ×3 IMPLANT
TAPE PAPER 2X10 WHT MICROPORE (GAUZE/BANDAGES/DRESSINGS) ×3 IMPLANT
TOWEL OR 17X24 6PK STRL BLUE (TOWEL DISPOSABLE) ×6 IMPLANT
TOWEL OR 17X26 10 PK STRL BLUE (TOWEL DISPOSABLE) ×6 IMPLANT
TRAY FOLEY IC TEMP SENS 14FR (CATHETERS) ×3 IMPLANT
TUBING INSUFFLATION 10FT LAP (TUBING) ×3 IMPLANT
UNDERPAD 30X30 INCONTINENT (UNDERPADS AND DIAPERS) ×3 IMPLANT
WATER STERILE IRR 1000ML POUR (IV SOLUTION) ×6 IMPLANT

## 2013-04-09 NOTE — Brief Op Note (Addendum)
04/07/2013 - 04/09/2013  12:42 PM  PATIENT:  Manual Meier  53 y.o. male  PRE-OPERATIVE DIAGNOSIS:  CAD  POST-OPERATIVE DIAGNOSIS:  CAD  PROCEDURE:  CORONARY ARTERY BYPASS GRAFTING x 4 (LIMA-LAD, SVG-OM2, SVG-OM3, SVG-PD) ENDOSCOPIC VEIN HARVEST RIGHT LEG, LEFT LOWER LEG  SURGEON:  Purcell Nails, MD  ASSISTANT: Coral Ceo, PA-C, Jari Favre, PA-S  ANESTHESIA:   Germaine Pomfret, MD  CROSSCLAMP TIME:   108'  CARDIOPULMONARY BYPASS TIME: 146'  FINDINGS:  Dilated and hypertrophied left ventricle with EF 50%  Small caliber but otherwise good quality LIMA conduit for grafting  Small caliber fair quality SVG conduit for grafting  Diffusely diseased poor quality LAD coronary artery  Satisfactory target vessels for grafting other than LAD  COMPLICATIONS: None  BASELINE WEIGHT: 105 kg  PATIENT DISPOSITION:   TO SICU IN STABLE CONDITION  Daylen Hack H 04/09/2013 1:54 PM

## 2013-04-09 NOTE — Progress Notes (Signed)
Patient cell phone and charger at nurses station has no family to pick it up. A friend name Alinda Money will pick up tonight at 7 pm

## 2013-04-09 NOTE — Progress Notes (Signed)
Belongings taken over to sicu.

## 2013-04-09 NOTE — Transfer of Care (Signed)
Immediate Anesthesia Transfer of Care Note  Patient: Joshua [name]  Procedure(s) Performed: Procedure(s) with comments: CORONARY ARTERY BYPASS GRAFTING (CABG) (N/A) - CABG x four,  using left internal mammary artery and right and left leg greater saphenous vein harvested endoscopically INTRAOPERATIVE TRANSESOPHAGEAL ECHOCARDIOGRAM (N/A)  Patient Location: PACU  Anesthesia Type:General  Level of Consciousness: unresponsive  Airway & Oxygen Therapy: Patient remains intubated per anesthesia plan and Patient placed on Ventilator (see vital sign flow sheet for setting)  Post-op Assessment: Report given to PACU RN and Post -op Vital signs reviewed and stable  Post vital signs: Reviewed and stable  Complications: No apparent anesthesia complications

## 2013-04-09 NOTE — Anesthesia Procedure Notes (Signed)
Procedure Name: Intubation Date/Time: 04/09/2013 7:53 AM Performed by: Tyrone Nine Pre-anesthesia Checklist: Patient identified, Timeout performed, Emergency Drugs available, Suction available and Patient being monitored Patient Re-evaluated:Patient Re-evaluated prior to inductionOxygen Delivery Method: Circle system utilized Preoxygenation: Pre-oxygenation with 100% oxygen Intubation Type: IV induction Ventilation: Two handed mask ventilation required and Oral airway inserted - appropriate to patient size Grade View: Grade I Tube type: Oral Tube size: 8.0 mm Number of attempts: 1 Airway Equipment and Method: Video-laryngoscopy and Rigid stylet Placement Confirmation: ETT inserted through vocal cords under direct vision,  breath sounds checked- equal and bilateral and positive ETCO2 Secured at: 24 cm Tube secured with: Tape Dental Injury: Teeth and Oropharynx as per pre-operative assessment  Difficulty Due To: Difficulty was anticipated and Difficult Airway- due to limited oral opening Comments: Thick neck/  Elective Glidescope intubation w/o difficulty

## 2013-04-09 NOTE — Progress Notes (Signed)
TCTS BRIEF SICU PROGRESS NOTE  Day of Surgery  S/P Procedure(s) (LRB): CORONARY ARTERY BYPASS GRAFTING (CABG) (N/A) INTRAOPERATIVE TRANSESOPHAGEAL ECHOCARDIOGRAM (N/A)   Extubated uneventfully Neuro grossly intact NSR w/ stable hemodynamics Minimal chest tube output UOP adequate Labs okay  Plan: Continue routine early postop  Joshua [name],Joshua [name] 04/09/2013 7:52 PM

## 2013-04-09 NOTE — OR Nursing (Signed)
13:15 - 1st call to SICU 

## 2013-04-09 NOTE — Progress Notes (Signed)
  Echocardiogram Echocardiogram Transesophageal has been performed.  Georgian Co 04/09/2013, 9:02 AM

## 2013-04-09 NOTE — Progress Notes (Addendum)
Brother dwayne andrews here and cell phone was given to him and patient off tele to go to or now

## 2013-04-09 NOTE — Preoperative (Signed)
Beta Blockers   Reason not to administer Beta Blockers:Not Applicable 

## 2013-04-09 NOTE — Procedures (Signed)
Extubation Procedure Note  Patient Details:   Name: Joshua [name] DOB: [birth year] MRN: [id number]   Airway Documentation:  Airway 8 mm (Active)  Secured at (cm) 25 cm 04/09/2013  2:35 PM  Measured From Lips 04/09/2013  2:35 PM  Secured Location Right 04/09/2013  2:35 PM  Secured By Pink Tape 04/09/2013  2:35 PM  Site Condition Dry 04/09/2013  2:35 PM    Evaluation  O2 sats: stable throughout Complications: No apparent complications Patient did tolerate procedure well. Bilateral Breath Sounds: Clear;Diminished Suctioning: Airway Yes NIF -40 / FVC .9L Charletta Cousin Evette 04/09/2013, 7:46 PM

## 2013-04-09 NOTE — Op Note (Addendum)
CARDIOTHORACIC SURGERY OPERATIVE NOTE  Date of Procedure: 04/09/2013  Preoperative Diagnosis:   Severe 3-vessel Coronary Artery Disease  S/P Acute Non-ST Segment Elevation Myocardial Infarction  Postoperative Diagnosis: Same  Procedure:    Coronary Artery Bypass Grafting x 4   Left Internal Mammary Artery to Distal Left Anterior Descending Coronary Artery  Saphenous Vein Graft to Posterior Descending Coronary Artery  Saphenous Vein Graft to Second Obtuse Marginal Branch of Left Circumflex Coronary Artery  Saphenous Vein Graft to Third Obtuse Marginal Branch of Left Circumflex Coronary Artery  Endoscopic Vein Harvest from Right Thigh and Bilateral Lower Legs  Surgeon: Salvatore Decent. Cornelius Moras, MD  Assistant: Coral Ceo, PA-C, Jari Favre, PA-S  Anesthesia: Germaine Pomfret, MD  Operative Findings:  Dilated and hypertrophied left ventricle with EF 50%   Small caliber but otherwise good quality LIMA conduit for grafting   Small caliber fair quality SVG conduit for grafting   Diffusely diseased poor quality LAD coronary artery   Satisfactory target vessels for grafting other than LAD     BRIEF CLINICAL NOTE AND INDICATIONS FOR SURGERY  53 year old caucasian male from [place] with a past medical history of hypertension, acid reflux and obesity. He has not been seen by a physician since he was 18. He presented to the ER with chest discomfort for the past 2 weeks associated with shortness of breath. The patient thought that it was just his acid reflux. He noticed the pain after eating initially, but he developed a prolonged episode of chest pain yesterday evening which persisted more than on hour. He denies any associated nausea, diaphoresis or shortness of breath. The patient took a handful of Rolaids tablets and one BC powder before going to the ER. In the ER the patients BP was 223/130, pulse 64. He was given labetalol 20mg  IV. Initial EKG was fine but subsequent EKGs revealed  ST depression in the V leads. At one point the patients EKG showed nonsustained wide complex tachycardia. Troponins were positive. He was diagnosed with a NSTEMI and the patient was given a heparin bolus and started on heparin gtt. After starting a nitro drip and a heparin drip the patients BP began to trend down. He reports no further episodes of chest pain since admission. He underwent diagnostic cardiac catheterization earlier today by Dr. Lady Gary which revealed severe 3-vessel CAD with preserved left ventricular systolic function. He was transferred to [facility] for possible elective surgical revascularization.  Serial cardiac enzymes were quite elevated, confirming the diagnosis of non-ST segment elevation myocardial infarction.  The patient has been seen and counseled at length regarding the indications, risks and potential benefits of surgery.  All questions have been answered, and the patient provides full informed consent for the operation as described.     DETAILS OF THE OPERATIVE PROCEDURE  Preparation:  The patient is brought to the operating room on the above mentioned date and central monitoring was established by the anesthesia team including placement of Swan-Ganz catheter and radial arterial line. The patient is placed in the supine position on the operating table.  Intravenous antibiotics are administered. General endotracheal anesthesia is induced uneventfully. A Foley catheter is placed.  Baseline transesophageal echocardiogram was performed.  Findings were notable for dilated left ventricle with mild LVH and mild global LV systolic dysfunction with EF 50%.    The patient's chest, abdomen, both groins, and both lower extremities are prepared and draped in a sterile manner. A time out procedure is performed.   Surgical Approach and Conduit  Harvest:  A median sternotomy incision was performed and the left internal mammary artery is dissected from the chest wall and prepared for  bypass grafting. The left internal mammary artery is notably small caliber but otherwise good quality conduit. Simultaneously, saphenous vein is obtained from the patient's right thigh and lower leg using endoscopic vein harvest technique. The saphenous vein is notably small caliber and only fair quality conduit. Portions were too small.  Subsequently, an additional segment of saphenous vein was removed from the left lower leg using endoscopic vein harvest technique.  This vein was satisfactory quality.  After removal of the saphenous vein, the small surgical incisions in the lower extremity are closed with absorbable suture. Following systemic heparinization, the left internal mammary artery was transected distally noted to have excellent flow.   Extracorporeal Cardiopulmonary Bypass and Myocardial Protection:  The pericardium is opened. The ascending aorta is mildly sclerotic in appearance. The ascending aorta and the right atrium are cannulated for cardioplegia bypass.  Adequate heparinization is verified.    A retrograde cardioplegia cannula is placed through the right atrium into the coronary sinus.  The entire pre-bypass portion of the operation was notable for stable hemodynamics.  Cardiopulmonary bypass was begun and the surface of the heart is inspected. Distal target vessels are selected for coronary artery bypass grafting. A cardioplegia cannula is placed in the ascending aorta.  A temperature probe was placed in the interventricular septum.  The patient is allowed to cool passively to [facility]32C systemic temperature.  The aortic cross clamp is applied and cold blood cardioplegia is delivered initially in an antegrade fashion through the aortic root.   Supplemental cardioplegia is given retrograde through the coronary sinus catheter.  Iced saline slush is applied for topical hypothermia.  The initial cardioplegic arrest is rapid with early diastolic arrest.  Repeat doses of cardioplegia are  administered intermittently throughout the entire cross clamp portion of the operation through the aortic root, through the coronary sinus catheter, and through subsequently placed vein grafts in order to maintain completely flat electrocardiogram and septal myocardial temperature below 15C.  Myocardial protection was felt to be excellent.  Coronary Artery Bypass Grafting:   The posterior descending branch of the right coronary artery was grafted using a reversed saphenous vein graft in an end-to-side fashion.  At the site of distal anastomosis the target vessel was good quality and measured approximately 1.5 mm in diameter.  The second obtuse marginal branch of the left circumflex coronary artery was grafted using a reversed saphenous vein graft in an end-to-side fashion.  At the site of distal anastomosis the target vessel was fair to good quality and measured approximately 1.3 mm in diameter.  The third obtuse marginal branch of the left circumflex coronary artery was grafted using a reversed saphenous vein graft in an end-to-side fashion.  At the site of distal anastomosis the target vessel was fair to good quality and measured approximately 1.5 mm in diameter.  The distal left anterior coronary artery was grafted with the left internal mammary artery in an end-to-side fashion.  This vessel was diffusely diseased throughout with no good clean areas to open the vessel.  At the site of distal anastomosis the target vessel was poor quality and measured approximately 1.5 mm in diameter.  Two of the three proximal vein graft anastomoses were placed directly to the ascending aorta prior to removal of the aortic cross clamp, including the proximal ends of the grafts to the posterior descending coronary artery and the  third obtuse marginal branch.  The septal myocardial temperature rose appropriately after reperfusion of the left internal mammary artery graft.  The aortic cross clamp was removed after a  total cross clamp time of 108 minutes.  The proximal end of the vein graft to the second obtuse marginal branch was placed to the proximal hood of the vein graft to the third obtuse marginal branch because the vein was too small to be sewn directly to the aorta.   Procedure Completion:  All proximal and distal coronary anastomoses were inspected for hemostasis and appropriate graft orientation. Epicardial pacing wires are fixed to the right ventricular outflow tract and to the right atrial appendage. The patient is rewarmed to 37C temperature. The patient is weaned and disconnected from cardiopulmonary bypass.  The patient's rhythm at separation from bypass was sinus.  The patient was weaned from cardioplegic bypass without any inotropic support. Total cardiopulmonary bypass time for the operation was 146 minutes.  Followup transesophageal echocardiogram performed after separation from bypass initially revealed some increased global LV dysfunction without any wall motion abnormalities.  Low dose milrinone infusion was added.  LV function returned to normal within 30 minutes after separation from bypass.  The aortic and venous cannula were removed uneventfully. Protamine was administered to reverse the anticoagulation. The mediastinum and pleural space were inspected for hemostasis and irrigated with saline solution. The mediastinum and the left pleural space were drained using 3 chest tubes placed through separate stab incisions inferiorly.  The soft tissues anterior to the aorta were reapproximated loosely. The sternum is closed with double strength sternal wire. The soft tissues anterior to the sternum were closed in multiple layers and the skin is closed with a running subcuticular skin closure.  The post-bypass portion of the operation was notable for stable rhythm and hemodynamics.  No blood products were administered during the operation.   Disposition:  The patient tolerated the procedure  well and is transported to the surgical intensive care in stable condition. There are no intraoperative complications. All sponge instrument and needle counts are verified correct at completion of the operation.    Salvatore Decent. Cornelius Moras MD 04/09/2013 2:01 PM

## 2013-04-10 ENCOUNTER — Encounter (HOSPITAL_COMMUNITY): Payer: Self-pay | Admitting: *Deleted

## 2013-04-10 ENCOUNTER — Inpatient Hospital Stay (HOSPITAL_COMMUNITY): Payer: Self-pay

## 2013-04-10 LAB — POCT I-STAT, CHEM 8
BUN: 16 mg/dL (ref 6–23)
CHLORIDE: 101 meq/L (ref 96–112)
Calcium, Ion: 1.29 mmol/L — ABNORMAL HIGH (ref 1.12–1.23)
Creatinine, Ser: 1.3 mg/dL (ref 0.50–1.35)
Glucose, Bld: 131 mg/dL — ABNORMAL HIGH (ref 70–99)
HEMATOCRIT: 33 % — AB (ref 39.0–52.0)
Hemoglobin: 11.2 g/dL — ABNORMAL LOW (ref 13.0–17.0)
POTASSIUM: 4 meq/L (ref 3.7–5.3)
Sodium: 139 mEq/L (ref 137–147)
TCO2: 26 mmol/L (ref 0–100)

## 2013-04-10 LAB — GLUCOSE, CAPILLARY
GLUCOSE-CAPILLARY: 100 mg/dL — AB (ref 70–99)
GLUCOSE-CAPILLARY: 103 mg/dL — AB (ref 70–99)
GLUCOSE-CAPILLARY: 105 mg/dL — AB (ref 70–99)
GLUCOSE-CAPILLARY: 105 mg/dL — AB (ref 70–99)
GLUCOSE-CAPILLARY: 114 mg/dL — AB (ref 70–99)
GLUCOSE-CAPILLARY: 102 mg/dL — AB (ref 70–99)
Glucose-Capillary: 101 mg/dL — ABNORMAL HIGH (ref 70–99)
Glucose-Capillary: 103 mg/dL — ABNORMAL HIGH (ref 70–99)
Glucose-Capillary: 112 mg/dL — ABNORMAL HIGH (ref 70–99)
Glucose-Capillary: 114 mg/dL — ABNORMAL HIGH (ref 70–99)
Glucose-Capillary: 123 mg/dL — ABNORMAL HIGH (ref 70–99)
Glucose-Capillary: 101 mg/dL — ABNORMAL HIGH (ref 70–99)
Glucose-Capillary: 103 mg/dL — ABNORMAL HIGH (ref 70–99)
Glucose-Capillary: 103 mg/dL — ABNORMAL HIGH (ref 70–99)
Glucose-Capillary: 110 mg/dL — ABNORMAL HIGH (ref 70–99)
Glucose-Capillary: 112 mg/dL — ABNORMAL HIGH (ref 70–99)
Glucose-Capillary: 113 mg/dL — ABNORMAL HIGH (ref 70–99)
Glucose-Capillary: 102 mg/dL — ABNORMAL HIGH (ref 70–99)
Glucose-Capillary: 97 mg/dL (ref 70–99)
Glucose-Capillary: 97 mg/dL (ref 70–99)
Glucose-Capillary: 113 mg/dL — ABNORMAL HIGH (ref 70–99)
Glucose-Capillary: 124 mg/dL — ABNORMAL HIGH (ref 70–99)
Glucose-Capillary: 128 mg/dL — ABNORMAL HIGH (ref 70–99)
Glucose-Capillary: 97 mg/dL (ref 70–99)
Glucose-Capillary: 98 mg/dL (ref 70–99)
Glucose-Capillary: 98 mg/dL (ref 70–99)

## 2013-04-10 LAB — POCT I-STAT 3, ART BLOOD GAS (G3+)
ACID-BASE DEFICIT: 3 mmol/L — AB (ref 0.0–2.0)
Bicarbonate: 22.8 mEq/L (ref 20.0–24.0)
O2 SAT: 95 %
TCO2: 24 mmol/L (ref 0–100)
pCO2 arterial: 43.9 mmHg (ref 35.0–45.0)
pH, Arterial: 7.327 — ABNORMAL LOW (ref 7.350–7.450)
pO2, Arterial: 83 mmHg (ref 80.0–100.0)

## 2013-04-10 LAB — CREATININE, SERUM
CREATININE: 1.21 mg/dL (ref 0.50–1.35)
GFR calc Af Amer: 78 mL/min — ABNORMAL LOW (ref >90)
GFR, EST NON AFRICAN AMERICAN: 67 mL/min — AB (ref >90)

## 2013-04-10 LAB — BASIC METABOLIC PANEL
BUN: 13 mg/dL (ref 6–23)
CHLORIDE: 106 meq/L (ref 96–112)
CO2: 22 mEq/L (ref 19–32)
Calcium: 7.8 mg/dL — ABNORMAL LOW (ref 8.4–10.5)
Creatinine, Ser: 1.03 mg/dL (ref 0.50–1.35)
GFR, EST NON AFRICAN AMERICAN: 82 mL/min — AB (ref >90)
Glucose, Bld: 109 mg/dL — ABNORMAL HIGH (ref 70–99)
POTASSIUM: 4.6 meq/L (ref 3.7–5.3)
SODIUM: 140 meq/L (ref 137–147)

## 2013-04-10 LAB — CBC
HCT: 33.3 % — ABNORMAL LOW (ref 39.0–52.0)
HCT: 31.9 % — ABNORMAL LOW (ref 39.0–52.0)
HEMOGLOBIN: 10.7 g/dL — AB (ref 13.0–17.0)
Hemoglobin: 11.1 g/dL — ABNORMAL LOW (ref 13.0–17.0)
MCH: 29.4 pg (ref 26.0–34.0)
MCH: 29.5 pg (ref 26.0–34.0)
MCHC: 33.3 g/dL (ref 30.0–36.0)
MCHC: 33.5 g/dL (ref 30.0–36.0)
MCV: 88.3 fL (ref 78.0–100.0)
MCV: 87.9 fL (ref 78.0–100.0)
PLATELETS: 156 10*3/uL (ref 150–400)
Platelets: 143 10*3/uL — ABNORMAL LOW (ref 150–400)
RBC: 3.77 MIL/uL — ABNORMAL LOW (ref 4.22–5.81)
RBC: 3.63 MIL/uL — ABNORMAL LOW (ref 4.22–5.81)
RDW: 14.2 % (ref 11.5–15.5)
RDW: 14.1 % (ref 11.5–15.5)
WBC: 8 10*3/uL (ref 4.0–10.5)
WBC: 7.9 10*3/uL (ref 4.0–10.5)

## 2013-04-10 LAB — CK TOTAL AND CKMB (NOT AT ARMC)
CK, MB: 17.4 ng/mL — AB (ref 0.3–4.0)
RELATIVE INDEX: 2.5 (ref 0.0–2.5)
Total CK: 685 U/L — ABNORMAL HIGH (ref 7–232)

## 2013-04-10 LAB — MAGNESIUM
MAGNESIUM: 2.6 mg/dL — AB (ref 1.5–2.5)
MAGNESIUM: 2.6 mg/dL — AB (ref 1.5–2.5)

## 2013-04-10 MED ORDER — FUROSEMIDE 10 MG/ML IJ SOLN
20.0000 mg | Freq: Four times a day (QID) | INTRAMUSCULAR | Status: AC
  Administered 2013-04-10 (×3): 20 mg via INTRAVENOUS
  Filled 2013-04-10 (×3): qty 2

## 2013-04-10 MED ORDER — INSULIN DETEMIR 100 UNIT/ML ~~LOC~~ SOLN
20.0000 [IU] | Freq: Once | SUBCUTANEOUS | Status: AC
  Administered 2013-04-10: 20 [IU] via SUBCUTANEOUS
  Filled 2013-04-10: qty 0.2

## 2013-04-10 MED ORDER — METOPROLOL TARTRATE 25 MG PO TABS
25.0000 mg | ORAL_TABLET | Freq: Two times a day (BID) | ORAL | Status: DC
  Administered 2013-04-10 – 2013-04-11 (×2): 25 mg via ORAL
  Filled 2013-04-10 (×3): qty 1

## 2013-04-10 MED ORDER — MIDAZOLAM HCL 2 MG/2ML IJ SOLN
INTRAMUSCULAR | Status: AC
  Filled 2013-04-10: qty 2

## 2013-04-10 MED ORDER — MORPHINE SULFATE 2 MG/ML IJ SOLN
2.0000 mg | INTRAMUSCULAR | Status: DC | PRN
  Administered 2013-04-10 – 2013-04-11 (×2): 2 mg via INTRAVENOUS
  Filled 2013-04-10 (×2): qty 1

## 2013-04-10 MED ORDER — INSULIN ASPART 100 UNIT/ML ~~LOC~~ SOLN
0.0000 [IU] | SUBCUTANEOUS | Status: DC
  Administered 2013-04-10: 2 [IU] via SUBCUTANEOUS

## 2013-04-10 MED ORDER — MIDAZOLAM HCL 2 MG/2ML IJ SOLN
2.0000 mg | Freq: Once | INTRAMUSCULAR | Status: AC
  Administered 2013-04-10: 2 mg via INTRAVENOUS

## 2013-04-10 MED ORDER — SIMVASTATIN 20 MG PO TABS
20.0000 mg | ORAL_TABLET | Freq: Every day | ORAL | Status: DC
  Administered 2013-04-10 – 2013-04-12 (×3): 20 mg via ORAL
  Filled 2013-04-10 (×4): qty 1

## 2013-04-10 MED ORDER — KETOROLAC TROMETHAMINE 30 MG/ML IJ SOLN
30.0000 mg | Freq: Four times a day (QID) | INTRAMUSCULAR | Status: DC
  Administered 2013-04-10: 30 mg via INTRAVENOUS
  Filled 2013-04-10 (×2): qty 1

## 2013-04-10 NOTE — Progress Notes (Signed)
TCTS BRIEF SICU PROGRESS NOTE  1 Day Post-Op  S/P Procedure(s) (LRB): CORONARY ARTERY BYPASS GRAFTING (CABG) (N/A) INTRAOPERATIVE TRANSESOPHAGEAL ECHOCARDIOGRAM (N/A)   Excellent day Pain improved Ambulated around SICU NSR w/ stable BP  Plan: Continue routine care  Tashala Cumbo H 04/10/2013 5:59 PM

## 2013-04-10 NOTE — Progress Notes (Signed)
[address]       [name] [address]             [phone]        CARDIOTHORACIC SURGERY PROGRESS NOTE   R1 Day Post-Op Procedure(s) (LRB): CORONARY ARTERY BYPASS GRAFTING (CABG) (N/A) INTRAOPERATIVE TRANSESOPHAGEAL ECHOCARDIOGRAM (N/A)  Subjective: Looks good.  Feels sore in chest but otherwise doing well.  Objective: Vital signs: BP Readings from Last 1 Encounters:  04/10/13 123/68   Pulse Readings from Last 1 Encounters:  04/10/13 83   Resp Readings from Last 1 Encounters:  04/10/13 30   Temp Readings from Last 1 Encounters:  04/10/13 98.8 F (37.1 C)     Hemodynamics: PAP: (22-45)/(9-25) 40/20 mmHg CO:  [4.4 L/min-6.3 L/min] 6.3 L/min CI:  [1.9 L/min/m2-2.7 L/min/m2] 2.7 L/min/m2  Physical Exam:  Rhythm:   sinus  Breath sounds: clear  Heart sounds:  RRR  Incisions:  Dressing dry, intact  Abdomen:  Soft, non-distended, non-tender  Extremities:  Warm, well-perfused    Intake/Output from previous day: 01/16 0701 - 01/17 0700 In: 8232 [P.O.:600; I.V.:6102; Blood:500; NG/GT:30; IV Piggyback:1000] Out: 2270 [Urine:1580; Emesis/NG output:100; Blood:250; Chest Tube:340] Intake/Output this shift: Total I/O In: 122.2 [I.V.:122.2] Out: 100 [Urine:70; Chest Tube:30]  Lab Results:  CBC: Recent Labs  04/09/13 2035 04/10/13 0411  WBC 8.5 8.0  HGB 11.3* 11.1*  HCT 34.2* 33.3*  PLT 132* 156    BMET:  Recent Labs  04/09/13 0340  04/09/13 2031 04/09/13 2035 04/10/13 0411  NA 143  < > 139  --  140  K 3.9  < > 4.3  --  4.6  CL 105  --  107  --  106  CO2 23  --   --   --  22  GLUCOSE 85  < > 111*  --  109*  BUN 12  --  13  --  13  CREATININE 1.06  --  1.10 1.04 1.03  CALCIUM 8.9  --   --   --  7.8*  < > = values in this interval not displayed.   CBG (last 3)   Recent Labs  04/10/13 0507 04/10/13 0622 04/10/13 0700  GLUCAP 105* 110* 114*    ABG    Component Value Date/Time   PHART 7.327* 04/10/2013 0411   PCO2ART 43.9 04/10/2013 0411   PO2ART 83.0 04/10/2013 0411   HCO3 22.8 04/10/2013 0411   TCO2 24 04/10/2013 0411   ACIDBASEDEF 3.0* 04/10/2013 0411   O2SAT 95.0 04/10/2013 0411    CXR: CLINICAL DATA:  Postop.   EXAM: PORTABLE CHEST - 1 VIEW   COMPARISON:  04/09/2013   FINDINGS: Stable position of the chest drains. No evidence for a large pneumothorax. Patchy densities in the left mid lung are most compatible with atelectasis. Overall, there are low lung volumes. Heart size is enlarged. Swan-Ganz catheter appears to be extending into the right lower pulmonary artery. Endotracheal tube and nasogastric tube have been removed.   IMPRESSION: Low lung volumes with probable subsegmental atelectasis in the left mid lung.   Chest tubes without a pneumothorax.     Electronically Signed   By: Richarda Overlie M.D.   On: 04/10/2013 08:21   Assessment/Plan: S/P Procedure(s) (LRB): CORONARY ARTERY BYPASS GRAFTING (CABG) (N/A) INTRAOPERATIVE TRANSESOPHAGEAL ECHOCARDIOGRAM (N/A)  Doing very well POD1 Expected post op acute blood loss anemia, very mild Expected post op volume excess, mild Expected post op atelectasis, moderate Hypertension Hyperlipidemia Still on insulin  drip, no h/o diabetes   Mobilize  Wean milrinone off  D/C tubes and lines  Diuresis  Increase metoprolol and add ACE-I in 1 or 2 days if renal function stable  Restart statin  One dose levemir and wean insulin drip  Add toradol for pain  OWEN,CLARENCE H 04/10/2013 9:53 AM

## 2013-04-11 ENCOUNTER — Inpatient Hospital Stay (HOSPITAL_COMMUNITY): Payer: Self-pay

## 2013-04-11 LAB — CBC
HEMATOCRIT: 31.9 % — AB (ref 39.0–52.0)
Hemoglobin: 10.9 g/dL — ABNORMAL LOW (ref 13.0–17.0)
MCH: 29.4 pg (ref 26.0–34.0)
MCHC: 34.2 g/dL (ref 30.0–36.0)
MCV: 86 fL (ref 78.0–100.0)
PLATELETS: 130 10*3/uL — AB (ref 150–400)
RBC: 3.71 MIL/uL — ABNORMAL LOW (ref 4.22–5.81)
RDW: 14 % (ref 11.5–15.5)
WBC: 7.5 10*3/uL (ref 4.0–10.5)

## 2013-04-11 LAB — GLUCOSE, CAPILLARY
GLUCOSE-CAPILLARY: 98 mg/dL (ref 70–99)
GLUCOSE-CAPILLARY: 98 mg/dL (ref 70–99)
GLUCOSE-CAPILLARY: 105 mg/dL — AB (ref 70–99)

## 2013-04-11 LAB — BASIC METABOLIC PANEL
BUN: 17 mg/dL (ref 6–23)
CALCIUM: 8.3 mg/dL — AB (ref 8.4–10.5)
CHLORIDE: 100 meq/L (ref 96–112)
CO2: 25 mEq/L (ref 19–32)
CREATININE: 1.12 mg/dL (ref 0.50–1.35)
GFR calc non Af Amer: 74 mL/min — ABNORMAL LOW (ref >90)
GFR, EST AFRICAN AMERICAN: 86 mL/min — AB (ref >90)
Glucose, Bld: 100 mg/dL — ABNORMAL HIGH (ref 70–99)
Potassium: 4.2 mEq/L (ref 3.7–5.3)
SODIUM: 138 meq/L (ref 137–147)

## 2013-04-11 MED ORDER — SODIUM CHLORIDE 0.9 % IV SOLN: 250.0000 mL | INTRAVENOUS | Status: DC | PRN

## 2013-04-11 MED ORDER — ENOXAPARIN SODIUM 40 MG/0.4ML ~~LOC~~ SOLN: 40.0000 mg | SUBCUTANEOUS | Status: DC

## 2013-04-11 MED ORDER — METOPROLOL TARTRATE 50 MG PO TABS
50.0000 mg | ORAL_TABLET | Freq: Two times a day (BID) | ORAL | Status: DC
  Administered 2013-04-11 – 2013-04-13 (×4): 50 mg via ORAL
  Filled 2013-04-11 (×5): qty 1

## 2013-04-11 MED ORDER — ALPRAZOLAM 0.25 MG PO TABS
0.2500 mg | ORAL_TABLET | Freq: Four times a day (QID) | ORAL | Status: DC | PRN
  Administered 2013-04-12: 0.25 mg via ORAL
  Filled 2013-04-11: qty 1

## 2013-04-11 MED ORDER — MOVING RIGHT ALONG BOOK
Freq: Once | Status: AC
  Administered 2013-04-11: 13:00:00
  Filled 2013-04-11: qty 1

## 2013-04-11 MED ORDER — LISINOPRIL 10 MG PO TABS
10.0000 mg | ORAL_TABLET | Freq: Every day | ORAL | Status: DC
Start: 2013-04-11 — End: 2013-04-13
  Administered 2013-04-11 – 2013-04-12 (×2): 10 mg via ORAL
  Filled 2013-04-11 (×3): qty 1

## 2013-04-11 MED ORDER — TRAMADOL HCL 50 MG PO TABS
50.0000 mg | ORAL_TABLET | ORAL | Status: DC | PRN
  Administered 2013-04-12 (×2): 100 mg via ORAL
  Filled 2013-04-11 (×2): qty 2

## 2013-04-11 MED ORDER — POTASSIUM CHLORIDE CRYS ER 20 MEQ PO TBCR
20.0000 meq | EXTENDED_RELEASE_TABLET | Freq: Two times a day (BID) | ORAL | Status: AC
  Administered 2013-04-11 – 2013-04-12 (×4): 20 meq via ORAL
  Filled 2013-04-11 (×5): qty 1

## 2013-04-11 MED ORDER — FUROSEMIDE 40 MG PO TABS
40.0000 mg | ORAL_TABLET | Freq: Two times a day (BID) | ORAL | Status: AC
  Administered 2013-04-11 – 2013-04-12 (×4): 40 mg via ORAL
  Filled 2013-04-11 (×4): qty 1

## 2013-04-11 MED ORDER — SODIUM CHLORIDE 0.9 % IJ SOLN
3.0000 mL | Freq: Two times a day (BID) | INTRAMUSCULAR | Status: DC
  Administered 2013-04-11 – 2013-04-12 (×3): 3 mL via INTRAVENOUS

## 2013-04-11 MED ORDER — SODIUM CHLORIDE 0.9 % IJ SOLN: 3.0000 mL | INTRAMUSCULAR | Status: DC | PRN

## 2013-04-11 MED ORDER — ENOXAPARIN SODIUM 40 MG/0.4ML ~~LOC~~ SOLN
40.0000 mg | SUBCUTANEOUS | Status: DC
  Administered 2013-04-12: 40 mg via SUBCUTANEOUS
  Filled 2013-04-11 (×2): qty 0.4

## 2013-04-11 NOTE — Progress Notes (Signed)
Pt transferred to 2w18 from 2s; pt oriented to room; call bell w/i reach; pt given PO pain meds; VSS; will cont. To monitor.

## 2013-04-11 NOTE — Progress Notes (Signed)
Report called to 2W and pt transferred to 2W17 with a wheelchair.  VSS upon transfer.  Pt ambulated to room from 2S with wheelchair.

## 2013-04-11 NOTE — Progress Notes (Signed)
Pt in chair.  Protocol assessment completed with score of 3.  Slight atelectasis on Xrays.  Continue with Is to improve atelectasis.

## 2013-04-11 NOTE — Progress Notes (Signed)
      [address]       [name] [address]             [phone]        CARDIOTHORACIC SURGERY PROGRESS NOTE   R2 Days Post-Op Procedure(s) (LRB): CORONARY ARTERY BYPASS GRAFTING (CABG) (N/A) INTRAOPERATIVE TRANSESOPHAGEAL ECHOCARDIOGRAM (N/A)  Subjective: Looks and feels well.  Soreness in chest improved.  Ambulating very well.  Good appetite  Objective: Vital signs: BP Readings from Last 1 Encounters:  04/11/13 158/94   Pulse Readings from Last 1 Encounters:  04/11/13 96   Resp Readings from Last 1 Encounters:  04/11/13 32   Temp Readings from Last 1 Encounters:  04/11/13 98.7 F (37.1 C) Oral    Hemodynamics: PAP: (36-44)/(14-19) 44/19 mmHg  Physical Exam:  Rhythm:   sinus  Breath sounds: clear  Heart sounds:  RRR  Incisions:  Clean and dry  Abdomen:  Soft, non-distended, non-tender  Extremities:  Warm, well-perfused    Intake/Output from previous day: 01/17 0701 - 01/18 0700 In: 2675.4 [P.O.:2040; I.V.:535.4; IV Piggyback:100] Out: 2040 [Urine:1960; Chest Tube:80] Intake/Output this shift: Total I/O In: 240 [P.O.:240] Out: -   Lab Results:  CBC: Recent Labs  04/10/13 1636 04/10/13 1642 04/11/13 0304  WBC 7.9  --  7.5  HGB 10.7* 11.2* 10.9*  HCT 31.9* 33.0* 31.9*  PLT 143*  --  130*    BMET:  Recent Labs  04/10/13 0411  04/10/13 1642 04/11/13 0304  NA 140  --  139 138  K 4.6  --  4.0 4.2  CL 106  --  101 100  CO2 22  --   --  25  GLUCOSE 109*  --  131* 100*  BUN 13  --  16 17  CREATININE 1.03  < > 1.30 1.12  CALCIUM 7.8*  --   --  8.3*  < > = values in this interval not displayed.   CBG (last 3)   Recent Labs  04/10/13 1951 04/10/13 2313 04/11/13 0327  GLUCAP 113* 97 98    ABG    Component Value Date/Time   PHART 7.327* 04/10/2013 0411   PCO2ART 43.9 04/10/2013 0411   PO2ART 83.0 04/10/2013 0411   HCO3 22.8 04/10/2013 0411   TCO2 26 04/10/2013 1642   ACIDBASEDEF 3.0* 04/10/2013 0411   O2SAT 95.0  04/10/2013 0411    CXR: CLINICAL DATA: Post cardiac surgery.  EXAM:  PORTABLE CHEST - 1 VIEW  COMPARISON: 04/10/2013  FINDINGS:  The chest tubes were removed. There is no evidence for a  pneumothorax. Swan-Ganz catheter has been removed. Right jugular  central line is still present. Improved aeration in the left lung  with persistent patchy densities. Findings in left lung are most  compatible with atelectasis. Heart size is appears to be is enlarged  and patient has median sternotomy wires.  IMPRESSION:  Removal of chest tubes without pneumothorax.  Improving aeration in left lung.  Electronically Signed  By: Richarda Overlie M.D.  On: 04/11/2013 08:48   Assessment/Plan: S/P Procedure(s) (LRB): CORONARY ARTERY BYPASS GRAFTING (CABG) (N/A) INTRAOPERATIVE TRANSESOPHAGEAL ECHOCARDIOGRAM (N/A)  Doing well POD2 Expected post op acute blood loss anemia, very mild  Expected post op volume excess, mild  Expected post op atelectasis, moderate  Hypertension  Hyperlipidemia Pre-op Acute Non-STEMI   Mobilize  Diuresis  Increase metoprolol and add ACE-I  Transfer floor Possible d/c home 1-2 days  OWEN,CLARENCE H 04/11/2013 9:45 AM

## 2013-04-12 ENCOUNTER — Inpatient Hospital Stay (HOSPITAL_COMMUNITY): Payer: Self-pay

## 2013-04-12 LAB — CBC
HEMATOCRIT: 35.1 % — AB (ref 39.0–52.0)
HEMOGLOBIN: 12 g/dL — AB (ref 13.0–17.0)
MCH: 29.2 pg (ref 26.0–34.0)
MCHC: 34.2 g/dL (ref 30.0–36.0)
MCV: 85.4 fL (ref 78.0–100.0)
Platelets: 214 10*3/uL (ref 150–400)
RBC: 4.11 MIL/uL — ABNORMAL LOW (ref 4.22–5.81)
RDW: 13.9 % (ref 11.5–15.5)
WBC: 9 10*3/uL (ref 4.0–10.5)

## 2013-04-12 LAB — BASIC METABOLIC PANEL
BUN: 19 mg/dL (ref 6–23)
CALCIUM: 8.6 mg/dL (ref 8.4–10.5)
CO2: 27 meq/L (ref 19–32)
CREATININE: 1.1 mg/dL (ref 0.50–1.35)
Chloride: 99 mEq/L (ref 96–112)
GFR calc Af Amer: 87 mL/min — ABNORMAL LOW (ref >90)
GFR calc non Af Amer: 75 mL/min — ABNORMAL LOW (ref >90)
GLUCOSE: 107 mg/dL — AB (ref 70–99)
Potassium: 4.4 mEq/L (ref 3.7–5.3)
Sodium: 138 mEq/L (ref 137–147)

## 2013-04-12 MED FILL — Magnesium Sulfate Inj 50%: INTRAMUSCULAR | Qty: 10 | Status: AC

## 2013-04-12 MED FILL — Electrolyte-R (PH 7.4) Solution: INTRAVENOUS | Qty: 4000 | Status: AC

## 2013-04-12 MED FILL — Lidocaine HCl IV Inj 20 MG/ML: INTRAVENOUS | Qty: 5 | Status: AC

## 2013-04-12 MED FILL — Sodium Bicarbonate IV Soln 8.4%: INTRAVENOUS | Qty: 50 | Status: AC

## 2013-04-12 MED FILL — Heparin Sodium (Porcine) Inj 1000 Unit/ML: INTRAMUSCULAR | Qty: 50 | Status: AC

## 2013-04-12 MED FILL — Mannitol IV Soln 20%: INTRAVENOUS | Qty: 500 | Status: AC

## 2013-04-12 MED FILL — Sodium Chloride IV Soln 0.9%: INTRAVENOUS | Qty: 2000 | Status: AC

## 2013-04-12 MED FILL — Heparin Sodium (Porcine) Inj 1000 Unit/ML: INTRAMUSCULAR | Qty: 30 | Status: AC

## 2013-04-12 MED FILL — Potassium Chloride Inj 2 mEq/ML: INTRAVENOUS | Qty: 40 | Status: AC

## 2013-04-12 NOTE — Anesthesia Postprocedure Evaluation (Addendum)
  Anesthesia Post-op Note  Patient: Joshua [name]  Procedure(s) Performed: Procedure(s) with comments: CORONARY ARTERY BYPASS GRAFTING (CABG) (N/A) - CABG x four,  using left internal mammary artery and right and left leg greater saphenous vein harvested endoscopically INTRAOPERATIVE TRANSESOPHAGEAL ECHOCARDIOGRAM (N/A)  Patient Location: Nursing Unit  Anesthesia Type:General  Level of Consciousness: awake, alert  and oriented  Airway and Oxygen Therapy: Patient Spontanous Breathing  Post-op Pain: mild  Post-op Assessment: Post-op Vital signs reviewed, Patient's Cardiovascular Status Stable, Respiratory Function Stable, Patent Airway, No signs of Nausea or vomiting, Adequate PO intake and Pain level controlled  Post-op Vital Signs: Reviewed and stable  Complications: No apparent anesthesia complications

## 2013-04-12 NOTE — Progress Notes (Signed)
Pt ambulated 400 feet,steady gait on room air O2 saturation at 96%,tolerated well.

## 2013-04-12 NOTE — Discharge Instructions (Signed)
Endoscopic Saphenous Vein Harvesting °Care After °Refer to this sheet in the next few weeks. These instructions provide you with information on caring for yourself after your procedure. Your caregiver may also give you more specific instructions. Your treatment has been planned according to current medical practices, but problems sometimes occur. Call your caregiver if you have any problems or questions after your procedure. °HOME CARE INSTRUCTIONS °Medicine °· Take whatever pain medicine your surgeon prescribes. Follow the directions carefully. Do not take over-the-counter pain medicine unless your surgeon says it is okay. Some pain medicine can cause bleeding problems for several weeks after surgery. °· Follow your surgeon's instructions about driving. You will probably not be permitted to drive after heart surgery. °· Take any medicines your surgeon prescribes. Any medicines you took before your heart surgery should be checked with your caregiver before you start taking them again. °Wound care °· Ask your surgeon how long you should keep wearing your elastic bandage or stocking. °· Check the area around your surgical cuts (incisions) whenever your bandages (dressings) are changed. Look for any redness or swelling. °· You will need to return to have the stitches (sutures) or staples taken out. Ask your surgeon when to do that. °· Ask your surgeon when you can shower or bathe. °Activity °· Try to keep your legs raised when you are sitting. °· Do any exercises your caregivers have given you. These may include deep breathing exercises, coughing, walking, or other exercises. °SEEK MEDICAL CARE IF: °· You have any questions about your medicines. °· You have more leg pain, especially if your pain medicine stops working. °· New or growing bruises develop on your leg. °· Your leg swells, feels tight, or becomes red. °· You have numbness in your leg. °SEEK IMMEDIATE MEDICAL CARE IF: °· Your pain gets much worse. °· Blood  or fluid leaks from any of the incisions. °· Your incisions become warm, swollen, or red. °· You have chest pain. °· You have trouble breathing. °· You have a fever. °· You have more pain near your leg incision. °MAKE SURE YOU: °· Understand these instructions. °· Will watch your condition. °· Will get help right away if you are not doing well or get worse. °Document Released: 11/21/2010 Document Revised: 06/03/2011 Document Reviewed: 11/21/2010 °ExitCare® Patient Information ©2014 [employer]. °Coronary Artery Bypass Grafting, Care After °Refer to this sheet in the next few weeks. These instructions provide you with information on caring for yourself after your procedure. Your health care provider may also give you more specific instructions. Your treatment has been planned according to current medical practices, but problems sometimes occur. Call your health care provider if you have any problems or questions after your procedure. °WHAT TO EXPECT AFTER THE PROCEDURE °Recovery from surgery will be different for everyone. Some people feel well after 3 or 4 weeks, while for others it takes longer. After your procedure, it is typical to have the following: °· Nausea and a lack of appetite.   °· Constipation. °· Weakness and fatigue.   °· Depression or irritability.   °· Pain or discomfort at your incision site. °HOME CARE INSTRUCTIONS °· Only take over-the-counter or prescription medicines as directed by your health care provider. Take all medicines exactly as directed. Do not stop taking medicines or start any new medicines without first checking with your health care provider.   °· Take your pulse as directed by your health care provider. °· Perform deep breathing as directed by your health care provider. If you were   given a device called an incentive spirometer, use it to practice deep breathing several times a day. Support your chest with a pillow or your arms when you take deep breaths or cough. °· Keep  incision areas clean, dry, and protected. Remove or change any bandages (dressings) only as directed by your health care provider. You may have skin adhesive strips over the incision areas. Do not take the strips off. They will fall off on their own. °· Check incision areas daily for any swelling, redness, or drainage. °· If incisions were made in your legs, do the following: °· Avoid crossing your legs.   °· Avoid sitting for long periods of time. Change positions every 30 minutes.   °· Elevate your legs when you are sitting.   °· Wear compression stockings as directed by your health care provider. These stockings help keep blood clots from forming in your legs. °· Take showers once your health care provider approves. Until then, only take sponge baths. Pat incisions dry. Do not rub incisions with a washcloth or towel. Do not take tub baths or go swimming until your health care provider approves. °· Eat foods that are high in fiber, such as raw fruits and vegetables, whole grains, beans, and nuts. Meats should be lean cut. Avoid canned, processed, and fried foods. °· Drink enough fluids to keep your urine clear or pale yellow. °· Weigh yourself every day. This helps identify if you are retaining fluid that may make your heart and lungs work harder.   °· Rest and limit activity as directed by your health care provider. You may be instructed to: °· Stop any activity at once if you have chest pain, shortness of breath, irregular heartbeats, or dizziness. Get help right away if you have any of these symptoms. °· Move around frequently for short periods or take short walks as directed by your health care provider. Increase your activities gradually. You may need physical therapy or cardiac rehabilitation to help strengthen your muscles and build your endurance. °· Avoid lifting, pushing, or pulling anything heavier than 10 lb (4.5 kg) for at least 6 weeks after surgery. °· Do not drive until your health care provider  approves.  °· Ask your health care provider when you may return to work and resume sexual activity. °· Follow up with your health care provider as directed.   °SEEK MEDICAL CARE IF: °· You have swelling, redness, increasing pain, or drainage at the site of an incision.   °· You develop a fever.   °· You have swelling in your ankles or legs.   °· You have pain in your legs.   °· You have weight gain of 2 or more pounds a day. °· You are nauseous or vomit. °· You have diarrhea.  °SEEK IMMEDIATE MEDICAL CARE IF: °· You have chest pain that goes to your jaw or arms. °· You have shortness of breath.   °· You have a fast or irregular heartbeat.   °· You notice a "clicking" in your breastbone (sternum) when you move.   °· You have numbness or weakness in your arms or legs. °· You feel dizzy or lightheaded.   °MAKE SURE YOU: °· Understand these instructions. °· Will watch your condition. °· Will get help right away if you are not doing well or get worse. °Document Released: 09/28/2004 Document Revised: 11/11/2012 Document Reviewed: 08/18/2012 °ExitCare® Patient Information ©2014 [employer]. ° °

## 2013-04-12 NOTE — Progress Notes (Addendum)
[address]       [place] [id number]             [phone]      3 Days Post-Op  Procedure(s) (LRB): CORONARY ARTERY BYPASS GRAFTING (CABG) (N/A) INTRAOPERATIVE TRANSESOPHAGEAL ECHOCARDIOGRAM (N/A) Subjective: Feels well, mild soreness  Objective  Telemetry sinus  Temp:  [97.9 F (36.6 C)-99.5 F (37.5 C)] 97.9 F (36.6 C) (01/19 0336) Pulse Rate:  [41-91] 78 (01/19 0336) Resp:  [8-40] 20 (01/19 0336) BP: (120-158)/(72-103) 124/72 mmHg (01/19 0336) SpO2:  [91 %-99 %] 97 % (01/19 0336) Weight:  [244 lb 3.2 oz (110.768 kg)] 244 lb 3.2 oz (110.768 kg) (01/19 0336)   Intake/Output Summary (Last 24 hours) at 04/12/13 0806 Last data filed at 04/11/13 1802  Gross per 24 hour  Intake    780 ml  Output    550 ml  Net    230 ml       General appearance: alert, cooperative and no distress Heart: regular rate and rhythm Lungs: min dim. in bases Abdomen: benign Extremities: + LE edema- mild Wound: incis healing well  Lab Results:  Recent Labs  04/10/13 0411 04/10/13 1636  04/11/13 0304 04/12/13 0355  NA 140  --   < > 138 138  K 4.6  --   < > 4.2 4.4  CL 106  --   < > 100 99  CO2 22  --   --  25 27  GLUCOSE 109*  --   < > 100* 107*  BUN 13  --   < > 17 19  CREATININE 1.03 1.21  < > 1.12 1.10  CALCIUM 7.8*  --   --  8.3* 8.6  MG 2.6* 2.6*  --   --   --   < > = values in this interval not displayed. No results found for this basename: AST, ALT, ALKPHOS, BILITOT, PROT, ALBUMIN,  in the last 72 hours No results found for this basename: LIPASE, AMYLASE,  in the last 72 hours  Recent Labs  04/11/13 0304 04/12/13 0355  WBC 7.5 9.0  HGB 10.9* 12.0*  HCT 31.9* 35.1*  MCV 86.0 85.4  PLT 130* 214    Recent Labs  04/10/13 1635  CKTOTAL 685*  CKMB 17.4*   No components found with this basename: POCBNP,  No results found for this basename: DDIMER,  in the last 72 hours No results found for this basename: HGBA1C,  in the last 72  hours No results found for this basename: CHOL, HDL, LDLCALC, TRIG, CHOLHDL,  in the last 72 hours No results found for this basename: TSH, T4TOTAL, FREET3, T3FREE, THYROIDAB,  in the last 72 hours No results found for this basename: VITAMINB12, FOLATE, FERRITIN, TIBC, IRON, RETICCTPCT,  in the last 72 hours  Medications: Scheduled . acetaminophen  1,000 mg Oral Q6H  . aspirin EC  325 mg Oral Daily  . bisacodyl  10 mg Oral Daily   Or  . bisacodyl  10 mg Rectal Daily  . Chlorhexidine Gluconate Cloth  6 each Topical Daily  . docusate sodium  200 mg Oral Daily  . enoxaparin (LOVENOX) injection  40 mg Subcutaneous Q24H  . furosemide  40 mg Oral BID  . lisinopril  10 mg Oral Daily  . metoprolol tartrate  50 mg Oral BID  . mupirocin ointment  1 application Nasal BID  . pantoprazole  40 mg Oral Daily  . potassium chloride  20  mEq Oral BID  . simvastatin  20 mg Oral q1800  . sodium chloride  3 mL Intravenous Q12H     Radiology/Studies:  Dg Chest 2 View  04/12/2013   CLINICAL DATA:  Non ST elevated myocardial infarction. Coronary artery disease. Hypertension. Postop from coronary bypass grafting poor  EXAM: CHEST  2 VIEW  COMPARISON:  04/11/2013  FINDINGS: Right jugular Cordis is been removed. No pneumothorax identified. Mild bibasilar and left upper lobe atelectasis shows no significant change. No evidence of pulmonary edema. Tiny bilateral pleural effusions remain.  IMPRESSION: No significant change in bilateral atelectasis and tiny bilateral pleural effusions. No pneumothorax visualized.   Electronically Signed   By: Myles Rosenthal M.D.   On: 04/12/2013 07:26   Dg Chest Port 1 View  04/11/2013   CLINICAL DATA:  Post cardiac surgery.  EXAM: PORTABLE CHEST - 1 VIEW  COMPARISON:  04/10/2013  FINDINGS: The chest tubes were removed. There is no evidence for a pneumothorax. Swan-Ganz catheter has been removed. Right jugular central line is still present. Improved aeration in the left lung with  persistent patchy densities. Findings in left lung are most compatible with atelectasis. Heart size is appears to be is enlarged and patient has median sternotomy wires.  IMPRESSION: Removal of chest tubes without pneumothorax.  Improving aeration in left lung.   Electronically Signed   By: Richarda Overlie M.D.   On: 04/11/2013 08:48    INR: Will add last result for INR, ABG once components are confirmed Will add last 4 CBG results once components are confirmed  Assessment/Plan: S/P Procedure(s) (LRB): CORONARY ARTERY BYPASS GRAFTING (CABG) (N/A) INTRAOPERATIVE TRANSESOPHAGEAL ECHOCARDIOGRAM (N/A)  1 doing well 2 cont lasix for volume overload/ small pleural effus 3 labs ok 4 May have room to increase lisinopril but will observe today 5 d/c epw's- poss home in am    LOS: 5 days    GOLD,WAYNE E 1/19/20158:06 AM  I have seen and examined the patient and agree with the assessment and plan as outlined.  Tentatively plan d/c home in am.  F/U with Dr Mariel Kansky in 2-3 weeks.  F/U with me in 3-4 weeks with CXR.  Instructions given.  OWEN,CLARENCE H 04/12/2013 8:34 AM

## 2013-04-12 NOTE — Progress Notes (Signed)
1315-1400 Cardiac Rehab Completed discharge education with pt and father. Pt voices understanding. Pt is interested in attending [facility] in [place], will send letter of interest to them. Gave pt financial form to fill out since he has no insurance. I put post-op [facility] video for them to watch. Beatrix Fetters, RN 04/12/2013 2:00 PM

## 2013-04-12 NOTE — Discharge Summary (Signed)
[address]       [place] [id number]             [phone]              Discharge Summary  Name: Joshua MeierCarl [name] DOB: 11/12/1960 53 y.o. MRN: [id number]   Admission Date: 04/07/2013 Discharge Date: 04/13/2013    Admitting Diagnosis: Non-ST elevation myocardial infarction Severe coronary artery disease   Discharge Diagnosis:  Non-ST elevation myocardial infarction Severe coronary artery disease Expected postop blood loss anemia  Past Medical History  Diagnosis Date  . Acid reflux   . Kidney stone   . NSTEMI (non-ST elevated myocardial infarction)   . Arthritis     SHOULDERS  . Coronary artery disease 04/07/2013    cath by Dr. Lady GaryFath at [facility]  . Hypertension   . Non-STEMI (non-ST elevated myocardial infarction) 04/06/2013  . Essential hypertension, benign 04/06/2013  . Obesity, unspecified 04/07/2013  . 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 male from [place] with a past medical history of hypertension, acid reflux and obesity. He has not been seen by a physician since he was 18. He presented to the ER at [facility] complaining of chest discomfort which had been present for the past 2 weeks associated with shortness of breath. The patient thought that it was just his acid reflux. He noticed the pain after eating initially, but he developed a prolonged episode of chest pain on the evening prior to admission which persisted more than one hour. He denies any associated nausea, diaphoresis or shortness of breath.  The patient took a handful of Rolaids tablets and one BC powder before going to the ER. In the ER, the patients BP was 223/130, pulse 64. He was given labetalol 20mg  IV. Initial EKG was fine but subsequent EKGs revealed ST depression in the V leads. At one point, the patient's EKG showed nonsustained wide complex tachycardia. Troponins were positive. He was diagnosed with a NSTEMI and the patient was given a heparin bolus and started on heparin gtt. After starting a nitro drip and a heparin drip, the patient's BP began to trend down. He reports no further episodes of chest pain since admission. He underwent diagnostic cardiac catheterization  by Dr. Lady GaryFath which revealed severe 3-vessel CAD with preserved left ventricular systolic function. He was transferred to [facility] under the care of Dr. Cornelius Moraswen for possible elective surgical revascularization.    Hospital Course:  The patient was admitted to [facility] on 04/07/2013.  Dr. Cornelius Moraswen saw the patient and reviewed his films and agreed with the need for CABG.   All risks, benefits and alternatives of surgery were explained in detail, and the patient agreed to proceed. The patient was taken to the operating room and underwent the above procedure.    The postoperative course has generally been uneventful.  He has remained in normal sinus rhythm and blood pressures have been stable.  Incisions are healing well.  He is ambulating without difficulty  and tolerating a regular diet.  He was started on Lasix for a mild volume overload and is diuresing well. He is medically stable on today's date for discharge home.     Recent vital signs:  Filed Vitals:   04/13/13 0748  BP: 126/78  Pulse: 73  Temp:   Resp: 18    Recent laboratory studies:  CBC: Recent Labs  04/11/13 0304 04/12/13 0355  WBC 7.5 9.0  HGB 10.9* 12.0*  HCT 31.9* 35.1*  PLT 130* 214   BMET:  Recent Labs  04/11/13 0304 04/12/13 0355  NA 138 138  K 4.2 4.4  CL 100 99  CO2 25 27    GLUCOSE 100* 107*  BUN 17 19  CREATININE 1.12 1.10  CALCIUM 8.3* 8.6    PT/INR: No results found for this basename: LABPROT, INR,  in the last 72 hours     Discharge Medications:     Medication List    STOP taking these medications       BC HEADACHE POWDER PO      TAKE these medications       aspirin 325 MG EC tablet  Take 1 tablet (325 mg total) by mouth daily.     lisinopril 20 MG tablet  Commonly known as:  PRINIVIL,ZESTRIL  Take 1 tablet (20 mg total) by mouth daily.     metoprolol 50 MG tablet  Commonly known as:  LOPRESSOR  Take 1 tablet (50 mg total) by mouth 2 (two) times daily.     oxyCODONE 5 MG immediate release tablet  Commonly known as:  Oxy IR/ROXICODONE  Take 1-2 tablets (5-10 mg total) by mouth every 3 (three) hours as needed for moderate pain.     simvastatin 20 MG tablet  Commonly known as:  ZOCOR  Take 1 tablet (20 mg total) by mouth daily at 6 PM.         Discharge Instructions:  The patient is to refrain from driving, heavy lifting or strenuous activity.  May shower daily and clean incisions with soap and water.  May resume regular diet.     Follow Up:       Future Appointments Provider Department Dept Phone   05/03/2013 3:00 PM Purcell Nails, MD [facility] [phone]      Follow-up Information   Follow up with Dalia Heading., MD. (Please contact office to arrange for appointment in 2 weeks with Dr. Lady Gary, your cardiologist)    Specialty:  Cardiology   Contact information:   [facility] [address] [place] [address] [phone]       Follow up with Purcell Nails, MD On 05/03/2013. (Have a chest x-ray at [facility] at 2:00, then see MD at 3:00)    Specialty:  Cardiothoracic Surgery   Contact information:   [address] [place] [address] [phone]       The patient has been discharged on:  1.Beta Blocker: Yes [ x ]  No [  ]  If No, reason:    2.Ace Inhibitor/ARB: Yes [x ]  No [  ]  If No, reason:    3.Statin: Yes [x  ]  No [ ]   If No, reason:    4.Ecasa: Yes [x  ]  No [ ]   If No, reason:   Sonya Pucci H 04/13/2013, 7:56 AM

## 2013-04-12 NOTE — Progress Notes (Signed)
Pt ambulating around unit for 3rd time today. Pt tolerated well.

## 2013-04-12 NOTE — Progress Notes (Signed)
Pt ambulated around unit pprox 600 feet independently

## 2013-04-13 ENCOUNTER — Encounter (HOSPITAL_COMMUNITY): Payer: Self-pay | Admitting: Thoracic Surgery (Cardiothoracic Vascular Surgery)

## 2013-04-13 LAB — POCT I-STAT 4, (NA,K, GLUC, HGB,HCT)
GLUCOSE: 124 mg/dL — AB (ref 70–99)
HCT: 36 % — ABNORMAL LOW (ref 39.0–52.0)
Hemoglobin: 12.2 g/dL — ABNORMAL LOW (ref 13.0–17.0)
Potassium: 4.3 mEq/L (ref 3.7–5.3)
Sodium: 140 mEq/L (ref 137–147)

## 2013-04-13 MED ORDER — ASPIRIN 325 MG PO TBEC: 325.0000 mg | DELAYED_RELEASE_TABLET | Freq: Every day | ORAL | Status: DC

## 2013-04-13 MED ORDER — LISINOPRIL 20 MG PO TABS: 20.0000 mg | ORAL_TABLET | Freq: Every day | ORAL | Status: DC

## 2013-04-13 MED ORDER — SIMVASTATIN 20 MG PO TABS: 20.0000 mg | ORAL_TABLET | Freq: Every day | ORAL | Status: DC

## 2013-04-13 MED ORDER — LISINOPRIL 20 MG PO TABS
20.0000 mg | ORAL_TABLET | Freq: Every day | ORAL | Status: DC
  Administered 2013-04-13: 20 mg via ORAL
  Filled 2013-04-13: qty 1

## 2013-04-13 MED ORDER — METOPROLOL TARTRATE 50 MG PO TABS: 50.0000 mg | ORAL_TABLET | Freq: Two times a day (BID) | ORAL | Status: DC

## 2013-04-13 MED ORDER — OXYCODONE HCL 5 MG PO TABS: 5.0000 mg | ORAL_TABLET | ORAL | Status: DC | PRN

## 2013-04-13 NOTE — Progress Notes (Signed)
   CARE MANAGEMENT NOTE 04/13/2013  Patient:  Joshua [name], Joshua [name]   Account Number:  [id number]  Date Initiated:  04/08/2013  Documentation initiated by:  AMERSON,JULIE  Subjective/Objective Assessment:   PT ADM WITH NSTEMI/3 VESSEL DISEASE ON 1/14.  PTA, PT INDEPENDENT, LIVES ALONE.     Action/Plan:   WILL FOLLOW FOR DISCHARGE NEEDS AS PT PROGRESSES.  MAY NEED ST-SNF DEPENDING ON HOME SUPPORT.  SCHEDULED FOR CABG ON 04/09/13.   Anticipated DC Date:  04/15/2013   Anticipated DC Plan:        DC Planning Services  CM consult  [facility]      Choice offered to / List presented to:             Status of service:  Completed, signed off Medicare Important Message given?   (If response is "NO", the following Medicare IM given date fields will be blank) Date Medicare IM given:   Date Additional Medicare IM given:    Discharge Disposition:  HOME/SELF CARE  Per UR Regulation:  Reviewed for med. necessity/level of care/duration of stay  If discussed at [facility] of Stay Meetings, dates discussed:    Comments:  04/13/13 10:45 Cm met with pt in room and gave pt [place] letter with list of participating pharmacies.  CM explained parameters of MATCH program to pt and pt verbalized understanding.  CM also gave pt handout for [facility] for future MD appts and to make an appt for medication assistance program.  CM gave pt Legal Aide of [facility] Handout with contact number for Open Enrollment.  Pt verbalized understanding this is a free service to set him up with a navigator to secure insurance. Pt responded enthusiactically to securing insurance and verbalized understanding he needs to manage his lifetime health and the importance of havig insurance.  PA in room to remind pt of followup appts with cardiology.  No other CM needs were communicated.  Mariane Masters, BSN, CM [phone].

## 2013-04-13 NOTE — Progress Notes (Signed)
Patient ambulated around the nurses station twice. Tolerated it well.

## 2013-04-13 NOTE — Progress Notes (Signed)
Sutures removed from 3 incision areas below sternal incision by Amalia Hailey, student nurse Asher Muir, RN supervised). Area cleansed and steri strips applied.  Pt tolerated well. Pt c/o small amount of drainage from right calf incision. Gauze applied and secured with tape. Pt sent home with extra gauze and tape to change as needed.  Pt discharged home per instructions. IV removed. All discharge orders/instructions/care/follow up discussed with patient and with a male friend who was at the bedside. Male friend said Mr. Scaccia would be staying with her for a few days while he recovered. Pt and friend verbalized understanding of all information discussed. Time allowed for questions/concerns. Pt and his friend stated they had none. Pt wheeled to the front main entrance by Amalia Hailey, Comptroller.  Alwyn Ren, RN

## 2013-04-13 NOTE — Progress Notes (Addendum)
       [address]       [name] [address]             [phone]          4 Days Post-Op Procedure(s) (LRB): CORONARY ARTERY BYPASS GRAFTING (CABG) (N/A) INTRAOPERATIVE TRANSESOPHAGEAL ECHOCARDIOGRAM (N/A)  Subjective: Feels well, no complaints.    Objective: Vital signs in last 24 hours: Patient Vitals for the past 24 hrs:  BP Temp Temp src Pulse Resp SpO2 Weight  04/13/13 0557 138/94 mmHg 98.5 F (36.9 C) Oral 71 18 95 % 241 lb 6.5 oz (109.5 kg)  04/12/13 2122 140/98 mmHg 99.8 F (37.7 C) Oral 84 18 96 % -  04/12/13 1324 127/74 mmHg 98.3 F (36.8 C) Oral 73 18 100 % -  04/12/13 1003 149/89 mmHg - - - - - -   Current Weight  04/13/13 241 lb 6.5 oz (109.5 kg)  BASELINE WEIGHT: 105 kg    Intake/Output from previous day: 01/19 0701 - 01/20 0700 In: 720 [P.O.:720] Out: 1101 [Urine:1100; Stool:1]    PHYSICAL EXAM:  Heart: RRR Lungs: Slightly diminished BS in bases Wound: Clean and dry Extremities: Trace LE edema    Lab Results: CBC: Recent Labs  04/11/13 0304 04/12/13 0355  WBC 7.5 9.0  HGB 10.9* 12.0*  HCT 31.9* 35.1*  PLT 130* 214   BMET:  Recent Labs  04/11/13 0304 04/12/13 0355  NA 138 138  K 4.2 4.4  CL 100 99  CO2 25 27  GLUCOSE 100* 107*  BUN 17 19  CREATININE 1.12 1.10  CALCIUM 8.3* 8.6    PT/INR: No results found for this basename: LABPROT, INR,  in the last 72 hours    Assessment/Plan: S/P Procedure(s) (LRB): CORONARY ARTERY BYPASS GRAFTING (CABG) (N/A) INTRAOPERATIVE TRANSESOPHAGEAL ECHOCARDIOGRAM (N/A) Stable, doing well.  Plan d/c home today, instructions reviewed with patient.   LOS: 6 days    COLLINS,GINA H 04/13/2013  I have seen and examined the patient and agree with the assessment and plan as outlined.  OWEN,CLARENCE H 04/13/2013 8:42 AM

## 2013-04-13 NOTE — Progress Notes (Signed)
Patient ambulated around the nurses station four times. Tolerated well.

## 2013-04-27 ENCOUNTER — Other Ambulatory Visit: Payer: Self-pay | Admitting: *Deleted

## 2013-04-27 DIAGNOSIS — I2510 Atherosclerotic heart disease of native coronary artery without angina pectoris: Secondary | ICD-10-CM

## 2013-05-03 ENCOUNTER — Ambulatory Visit
Admission: RE | Admit: 2013-05-03 | Discharge: 2013-05-03 | Disposition: A | Discharge status: Home / Self Care | Payer: Self-pay | Source: Ambulatory Visit | Attending: Thoracic Surgery (Cardiothoracic Vascular Surgery) | Admitting: Thoracic Surgery (Cardiothoracic Vascular Surgery)

## 2013-05-03 ENCOUNTER — Ambulatory Visit (INDEPENDENT_AMBULATORY_CARE_PROVIDER_SITE_OTHER): Payer: Self-pay | Admitting: Thoracic Surgery (Cardiothoracic Vascular Surgery)

## 2013-05-03 ENCOUNTER — Encounter: Payer: Self-pay | Admitting: Thoracic Surgery (Cardiothoracic Vascular Surgery)

## 2013-05-03 VITALS — BP 101/56 | HR 56 | Resp 20 | Ht 73.0 in | Wt 241.0 lb

## 2013-05-03 DIAGNOSIS — Z951 Presence of aortocoronary bypass graft: Secondary | ICD-10-CM

## 2013-05-03 DIAGNOSIS — I2510 Atherosclerotic heart disease of native coronary artery without angina pectoris: Secondary | ICD-10-CM

## 2013-05-03 NOTE — Patient Instructions (Signed)
The patient may return to driving an automobile as long as they are no longer requiring oral narcotic pain relievers during the daytime.  It would be wise to start driving only short distances during the daylight and gradually increase from there as they feel comfortable.  The patient should continue to avoid any heavy lifting or strenuous use of arms or shoulders for at least a total of three months from the time of surgery.  The patient is encouraged to enroll and participate in the outpatient cardiac rehab program beginning as soon as practical.  

## 2013-05-03 NOTE — Progress Notes (Signed)
[address]       [place] [id number]             [phone]     CARDIOTHORACIC SURGERY OFFICE NOTE  Referring Provider is Dalia HeadingFath, Kenneth A, MD PCP is Provider Not In System   HPI:  Patient returns for routine followup status post coronary artery bypass grafting x4 on 04/09/2013 for severe three-vessel coronary artery disease status post acute non-ST segment elevation myocardial infarction. His postoperative recovery was entirely uncomplicated. Since hospital discharge she has continued to recover uneventfully. He was seen in followup by Dr. Lady GaryFath last week and returns to our office for routine followup today.  He states that he has done very well since hospital discharge. He denies any significant exertional shortness of breath or chest discomfort. He does have soreness in his chest related to the surgery which has been gradually improving. He is no longer taking any sort of oral narcotic pain relievers, and over-the-counter pain relievers has been adequate for the last couple of weeks. His appetite is good. His activity level is good and he has been walking on regular basis. He has not yet started the cardiac rehabilitation program.   Current Outpatient Prescriptions  Medication Sig Dispense Refill  . aspirin EC 325 MG EC tablet Take 1 tablet (325 mg total) by mouth daily.  30 tablet  0  . lisinopril (PRINIVIL,ZESTRIL) 20 MG tablet Take 1 tablet (20 mg total) by mouth daily.  30 tablet  1  . metoprolol (LOPRESSOR) 50 MG tablet Take 1 tablet (50 mg total) by mouth 2 (two) times daily.  60 tablet  1  . oxyCODONE (OXY IR/ROXICODONE) 5 MG immediate release tablet Take 1-2 tablets (5-10 mg total) by mouth every 3 (three) hours as needed for moderate pain.  30 tablet  0  . simvastatin (ZOCOR) 20 MG tablet Take 1 tablet (20 mg total) by mouth daily at 6 PM.  30 tablet  1   No current facility-administered medications for this visit.      Physical Exam:   BP 101/56   Pulse 56  Resp 20  Ht 6\' 1"  (1.854 m)  Wt 241 lb (109.317 kg)  BMI 31.80 kg/m2  SpO2 96%  General:  Well-appearing  Chest:   Clear to auscultation  CV:   Regular rate and rhythm without murmur  Incisions:  Clean dry and healing nicely, sternum is stable  Abdomen:  Soft and nontender  Extremities:  Warm and well-perfused  Diagnostic Tests:  CHEST 2 VIEW  COMPARISON: 04/12/2013  FINDINGS:  Cardiomediastinal silhouette is stable. The patient is status post  CABG. No acute infiltrate or pleural effusion. No pulmonary edema.  Bony thorax is unremarkable.  IMPRESSION:  No active cardiopulmonary disease.  Electronically Signed  By: Natasha MeadLiviu Pop M.D.  On: 05/03/2013 14:38    Impression:  Patient is doing very well 4 weeks status post coronary artery bypass grafting for severe three-vessel coronary artery disease status post acute non-ST segment elevation myocardial infarction. The patient is making excellent progress and appears to be ready to start the outpatient cardiac rehabilitation program.  Plan:  I've encouraged patient to continue to gradually increase his physical activity as tolerated but I have reminded him to refrain from any sort of heavy lifting or strenuous use of his arms or shoulders for another 2 months at least. I think he can resume driving an automobile and I've encouraged him to go ahead and enroll in the  outpatient cardiac rehabilitation program. We discussed risk factor modification at length. He will continue to followup with Dr. Lady Gary indefinitely. We have discussed this strategy for him returning to work and all his questions been addressed we have not made any changes in his current medications. The patient will return in one year for routine followup.   Salvatore Decent. Cornelius Moras, MD 05/03/2013 3:24 PM

## 2014-04-04 ENCOUNTER — Ambulatory Visit: Payer: Self-pay | Admitting: Thoracic Surgery (Cardiothoracic Vascular Surgery)

## 2014-04-28 ENCOUNTER — Telehealth: Payer: Self-pay | Admitting: *Deleted

## 2014-04-28 NOTE — Telephone Encounter (Signed)
patient cancelled, will call back to r/s 04/28/14

## 2014-05-02 ENCOUNTER — Ambulatory Visit: Payer: Self-pay | Admitting: Thoracic Surgery (Cardiothoracic Vascular Surgery)

## 2014-07-11 ENCOUNTER — Ambulatory Visit: Payer: Self-pay | Admitting: Thoracic Surgery (Cardiothoracic Vascular Surgery)

## 2014-07-16 NOTE — Discharge Summary (Signed)
PATIENT NAME:  Joshua [name], Joshua [name] MR#:  [id number] DATE OF BIRTH:  [birth year]  DATE OF ADMISSION:  04/07/2013 DATE OF DISCHARGE:  04/07/2013   Manual Meier was admitted for chest pain with diagnosis of non-ST-elevation MI with elevated troponin. He was taken for cardiac cath by Dr. Kirke Corin and because of complicated lesions, he needed to be transferred to [facility], and before I saw the patient, he was transferred to the hospital for further intervention.   ____________________________ Hope Pigeon Elisabeth Pigeon, MD vgv:dmm D: 04/11/2013 23:47:00 ET T: 04/12/2013 11:00:42 ET JOB#: [id number]  cc: Hope Pigeon. Elisabeth Pigeon, MD, <Dictator> Altamese Dilling MD ELECTRONICALLY SIGNED 04/16/2013 18:39

## 2014-07-16 NOTE — H&P (Signed)
PATIENT NAME:  Joshua [name], Joshua [name] MR#:  [id number] DATE OF BIRTH:  08/18/1960  DATE OF ADMISSION:  04/07/2013  PRIMARY CARE PHYSICIAN: None.   REFERRING PHYSICIAN: Dr. Carollee Massed.   CHIEF COMPLAINT: Chest pain.   HISTORY OF PRESENT ILLNESS: The patient is a 54 year old Caucasian male with no significant past medical history, other than obesity. Not seen by any physician for more than 5 years. He is presenting to the ER with a chief complaint of chest discomfort for the past 1 week. The patient is reporting that he has been having intermittent episodes of chest tightness for the past 1 week associated with shortness of breath. Denies nausea or vomiting. Denies any dizziness. No loss of consciousness. No similar complaints in the past. Not seen by any doctors for more than 5 years because he was feeling good. As the chest pain was getting worse and he could not breathe last night, the patient came to the ER. He felt sick and felt like something was not right. In the ER, the patient's blood pressure was at 223/130 with pulse 64. The patient was given labetalol 20 mg IV. CT head was done which is negative for any acute bleeds. Initial EKG was fine but subsequently ST depressions were noted in the V leads. At one point, the patient's EKG showed nonsustained wide complex tachycardia. ER physician has called and notified the on-call cardiologist, Dr. Lady Gary who is aware of this patient. With the patient's blood pressure persistently being high, he was started on nitroglycerin drip. Also, the patient's troponin is positive with ST depressions. He is diagnosed with NSTEMI and the patient was given heparin bolus and started on heparin gtt. During my examination after starting heparin drip and nitro drip, the patient's blood pressure trended down, and he started feeling better. Denies any headache, blurry vision, double vision. Black spots or floaters. No other complaints. No family members at bedside.   PAST MEDICAL HISTORY:    obesity  PAST SURGICAL HISTORY: None.   ALLERGIES: No known drug allergies.   PSYCHOSOCIAL HISTORY: Lives alone. No smoking, alcohol or illicit drug use. Not on any home medications.   FAMILY HISTORY: Dad had history of heart attack at age 26.   REVIEW OF SYSTEMS: CONSTITUTIONAL: Denies fever or fatigue. EYES:  Denies blurry vision or double vision. ENT:  Denies epistaxis, discharge.  RESPIRATION: Denies cough, COPD.  CARDIOVASCULAR: Complaining of chest pain, tightness, shortness of breath.  GASTROINTESTINAL: Denies nausea, vomiting, diarrhea.  GENITOURINARY: No dysuria or hematuria.  ENDOCRINE: Denies polyuria, nocturia, thyroid problems.  HEMATOLOGIC AND LYMPHATIC: No anemia, easy bruising, bleeding.  INTEGUMENTARY: No acne, rash, lesions.  MUSCULOSKELETAL: No joint pain in the neck, back and shoulder.  NEUROLOGIC: No vertigo, ataxia.  PSYCHIATRIC: No ADD, OCD.   PHYSICAL EXAMINATION: VITAL SIGNS: Pulse 64, blood pressure initially 223/130, respirations 18, pulse ox of 95%. Subsequently after nitro drip, the patient's blood pressure trended down to 180/106 with pulse 90, respirations 18 to a pulse oximetry 94%.  GENERAL APPEARANCE: Not in acute distress. Obese.  HEENT: Normocephalic, atraumatic. Pupils are equally reacting to light and accommodation. No scleral icterus. No conjunctival injection. No sinus tenderness. No postnasal drip.  NECK: Supple. No JVD. No thyromegaly.  LUNGS: Clear to auscultation bilaterally. No accessory muscle use. No anterior chest wall tenderness on palpation. No peripheral edema.  CARDIAC: S1, S2 normal. Regular rate and rhythm with intermittent skipped beats. No murmurs.  GASTROINTESTINAL: Soft, obese. Bowel sounds are positive in all 4 quadrants. Nontender, nondistended. No  hepatosplenomegaly. No masses felt.  NEUROLOGIC: Awake, alert, oriented x 3. Motor and sensory grossly intact. Reflexes are 2+. Cranial nerves II through XII are intact.   EXTREMITIES: No edema. No cyanosis. No clubbing.  SKIN: Warm to touch. Normal turgor. No rashes. No lesions.  MUSCULOSKELETAL: No joint effusion, tenderness, erythema.  PSYCHIATRIC: Normal mood and affect.   LABORATORY AND IMAGING STUDIES: LFTs are normal. CPK 12.9, troponin 1.10. WBC 9.1, hemoglobin 17.2, hematocrit 50.7, platelets 251. Chem-8: Glucose is elevated at 133, anion gap is at 4. CT of the head without contrast: No acute intracranial process. Normal noncontrast CT of the head. Portable chest x-ray has revealed no active disease. A 12-lead EKG: Normal sinus rhythm but ST depression were noted in leads V1 through V4. The first EKG was done at 1:34 on 01/14. Repeat EKG at 4:47 on 01/14 has revealed wide complex tachycardia, WPW pattern which has subsequently resolved.   ASSESSMENT AND PLAN: A 54 year old Caucasian male, not seen by any physicians for the past 5 years, or more than that, is coming with chest pain and shortness of breath. He will be admitted with the following assessment and plan.  1.  Non-ST segment elevation myocardial infarction. We will admit him to Critical Care Unit. We will put him on acute coronary syndrome protocol with oxygen, nitroglycerin, aspirin, beta blocker and statin. Heparin gtt is initiated. Cardiology consult is placed to Dr. Lady Gary and cycle cardiac biomarkers.  2.  Malignant hypertension. The patient was not seen by any doctors in the past 5 years or so. We will put him on nitroglycerin drip. The patient will be on beta blocker.  3.  Obesity. Will check a fasting lipid panel and the patient will be on statin.  4.  We will provide gastrointestinal and deep vein thrombosis prophylaxis. He is a FULL CODE.   Diagnosis and plan of care was discussed in detail with the patient. He is aware of the plan. The patient needs to establish primary care follow-up at the time of discharge.   TOTAL CRITICAL CARE TIME SPENT: 60 minutes.      ____________________________ Ramonita Lab, MD ag:dp D: 04/07/2013 07:38:06 ET T: 04/07/2013 08:00:22 ET JOB#: [id number]  cc: Ramonita Lab, MD, <Dictator> Ramonita Lab MD ELECTRONICALLY SIGNED 04/18/2013 7:13

## 2014-07-16 NOTE — Consult Note (Signed)
   Present Illness 54 yo male with no medical history but who has no medical care who presented to the er with 3-4 days of intermitant chest pain which worsened at time of presentation with 10/10 midsternal chest pain with radiation to his arm.  EKG showed st depression in inferior leads. Was hypertensive at time of admission. Blood pressure improved with pain relief, labetolol, ntg. He has ruled in for an mi with troponin of greater than 40. Risk factors are hypertension. Unclear if he has hyperlipidemia. Denies tobacco abuse and diabetes. Strong family history for mi. EKG in er showed brief nonsustained accelerated idioventricular rhythm. Currently pain free and hemodynamically stable.   Physical Exam:  GEN well developed, well nourished, no acute distress   HEENT PERRL, moist oral mucosa   NECK supple  No masses   RESP normal resp effort  clear BS   CARD Regular rate and rhythm  No murmur   ABD denies tenderness  normal BS  no Abdominal Bruits   LYMPH negative neck, negative axillae   EXTR negative cyanosis/clubbing, negative edema   SKIN normal to palpation   NEURO cranial nerves intact, motor/sensory function intact   PSYCH A+O to time, place, person   Review of Systems:  Subjective/Chief Complaint chest pain with radiation to left arm   General: No Complaints   Skin: No Complaints   ENT: No Complaints   Eyes: No Complaints   Neck: No Complaints   Respiratory: No Complaints   Cardiovascular: Chest pain or discomfort   Gastrointestinal: No Complaints   Genitourinary: No Complaints   Vascular: No Complaints   Musculoskeletal: No Complaints   Neurologic: No Complaints   Hematologic: No Complaints   Endocrine: No Complaints   Psychiatric: No Complaints   Review of Systems: All other systems were reviewed and found to be negative   Medications/Allergies Reviewed Medications/Allergies reviewed   EKG:  EKG NSR    No Known Allergies:    Impression 54  yo male with no known medical history who presented to the er with midsternal chest pain occurring at rest. Had stuttering symptoms over the past week. EKG suggested inferior ischemia. Ruled in for nstemi with troponin of greater than 40. Hemodynamcially stable at present. Risk and benefits of left cardiac cath expalined to patient.   Plan 1. Continue with heparin, nitrates, asa, beta blocker and statin. 2. Echo to evaluate lv function 3. Cardiac cath to evaluate anatomy to guide further therapy. 4. Will add aggrestat to his regimen.   Electronic Signatures: Dalia Heading (MD)  (Signed 14-Jan-15 08:49)  Authored: General Aspect/Present Illness, History and Physical Exam, Review of System, Home Medications, EKG , Allergies, Impression/Plan   Last Updated: 14-Jan-15 08:49 by Dalia Heading (MD)

## 2015-02-17 IMAGING — CR DG CHEST 1V PORT
1 series · 1 of 1 positions shown · non-contrast
Comparison: None.

CLINICAL DATA: Chest pain

EXAM:
PORTABLE CHEST - 1 VIEW

[ap]
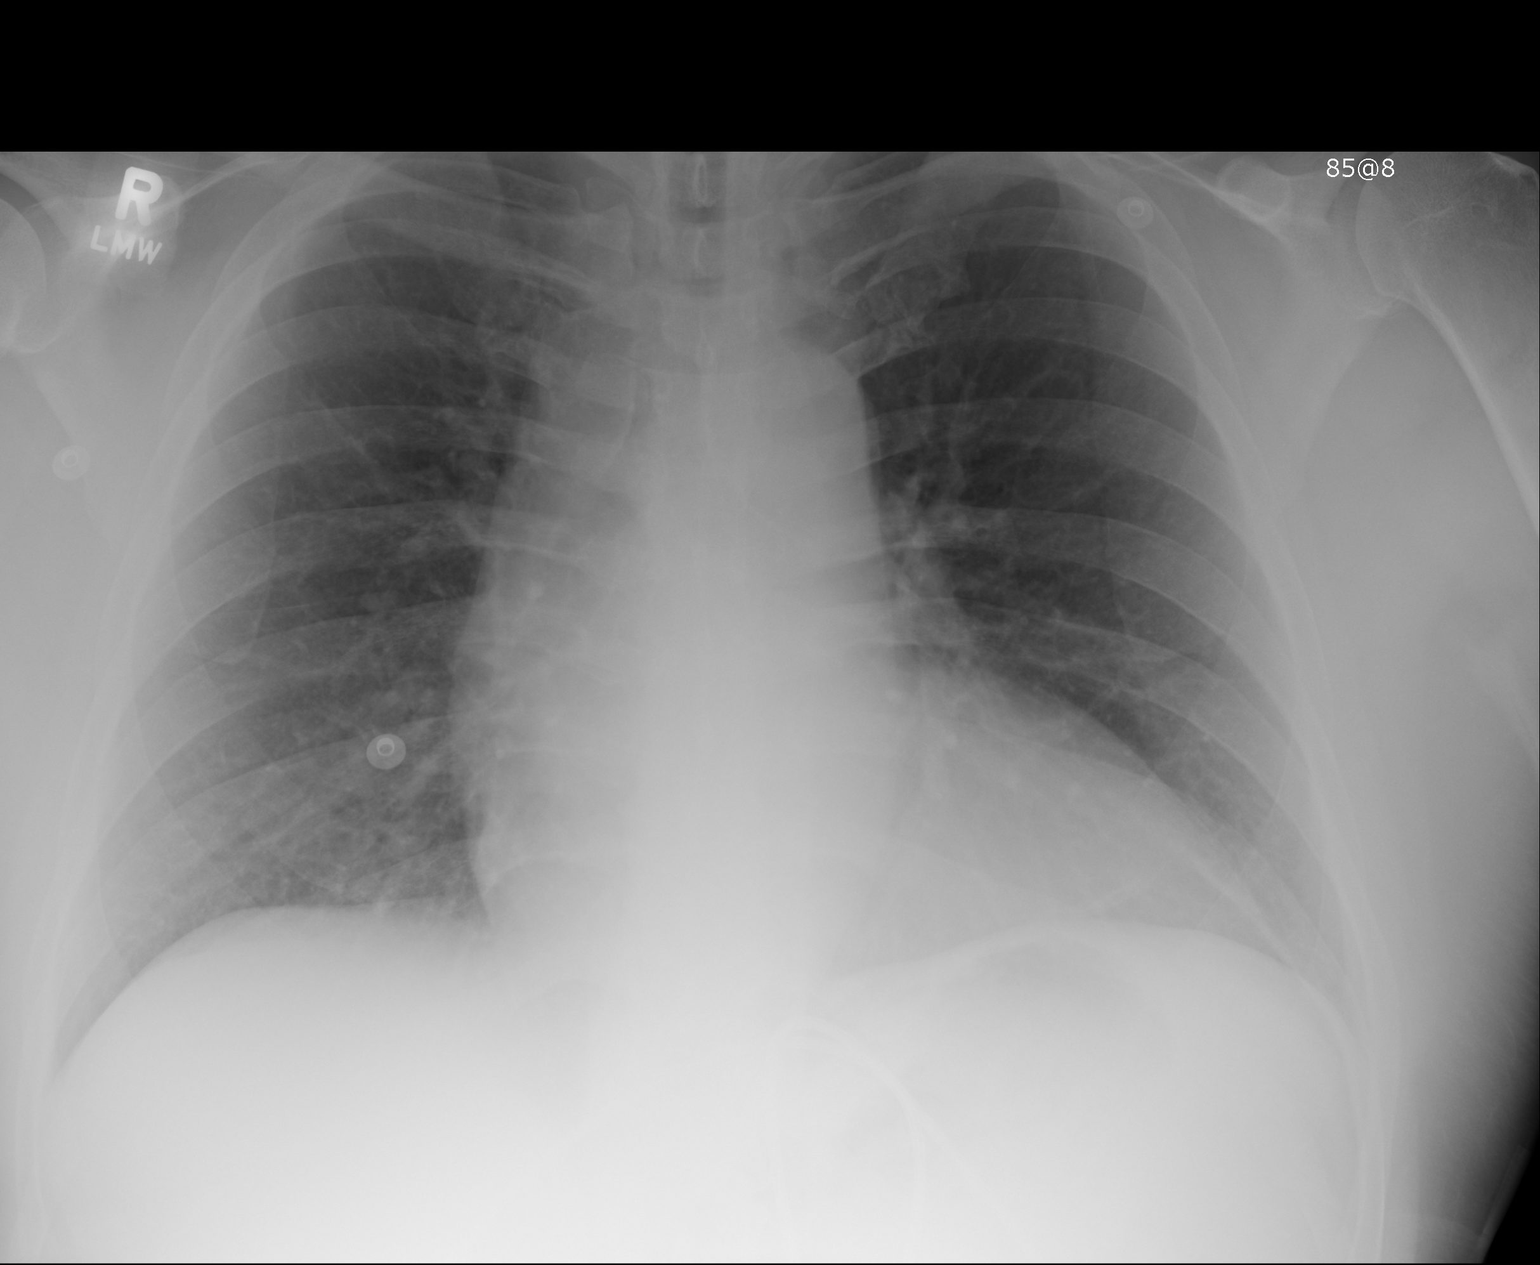

[1 of 1 positions shown; findings below may reference images not displayed]

FINDINGS: Appears to be mild cardiomegaly, although heart size and mediastinal
contours are accentuated by technique. No consolidation or edema. No
effusion or pneumothorax.
IMPRESSION: No active disease.

## 2015-02-21 IMAGING — CR DG CHEST 1V PORT
1 series · 1 of 1 positions shown · non-contrast
Comparison: 04/10/2013

CLINICAL DATA: Post cardiac surgery.

EXAM:
PORTABLE CHEST - 1 VIEW

[AP]
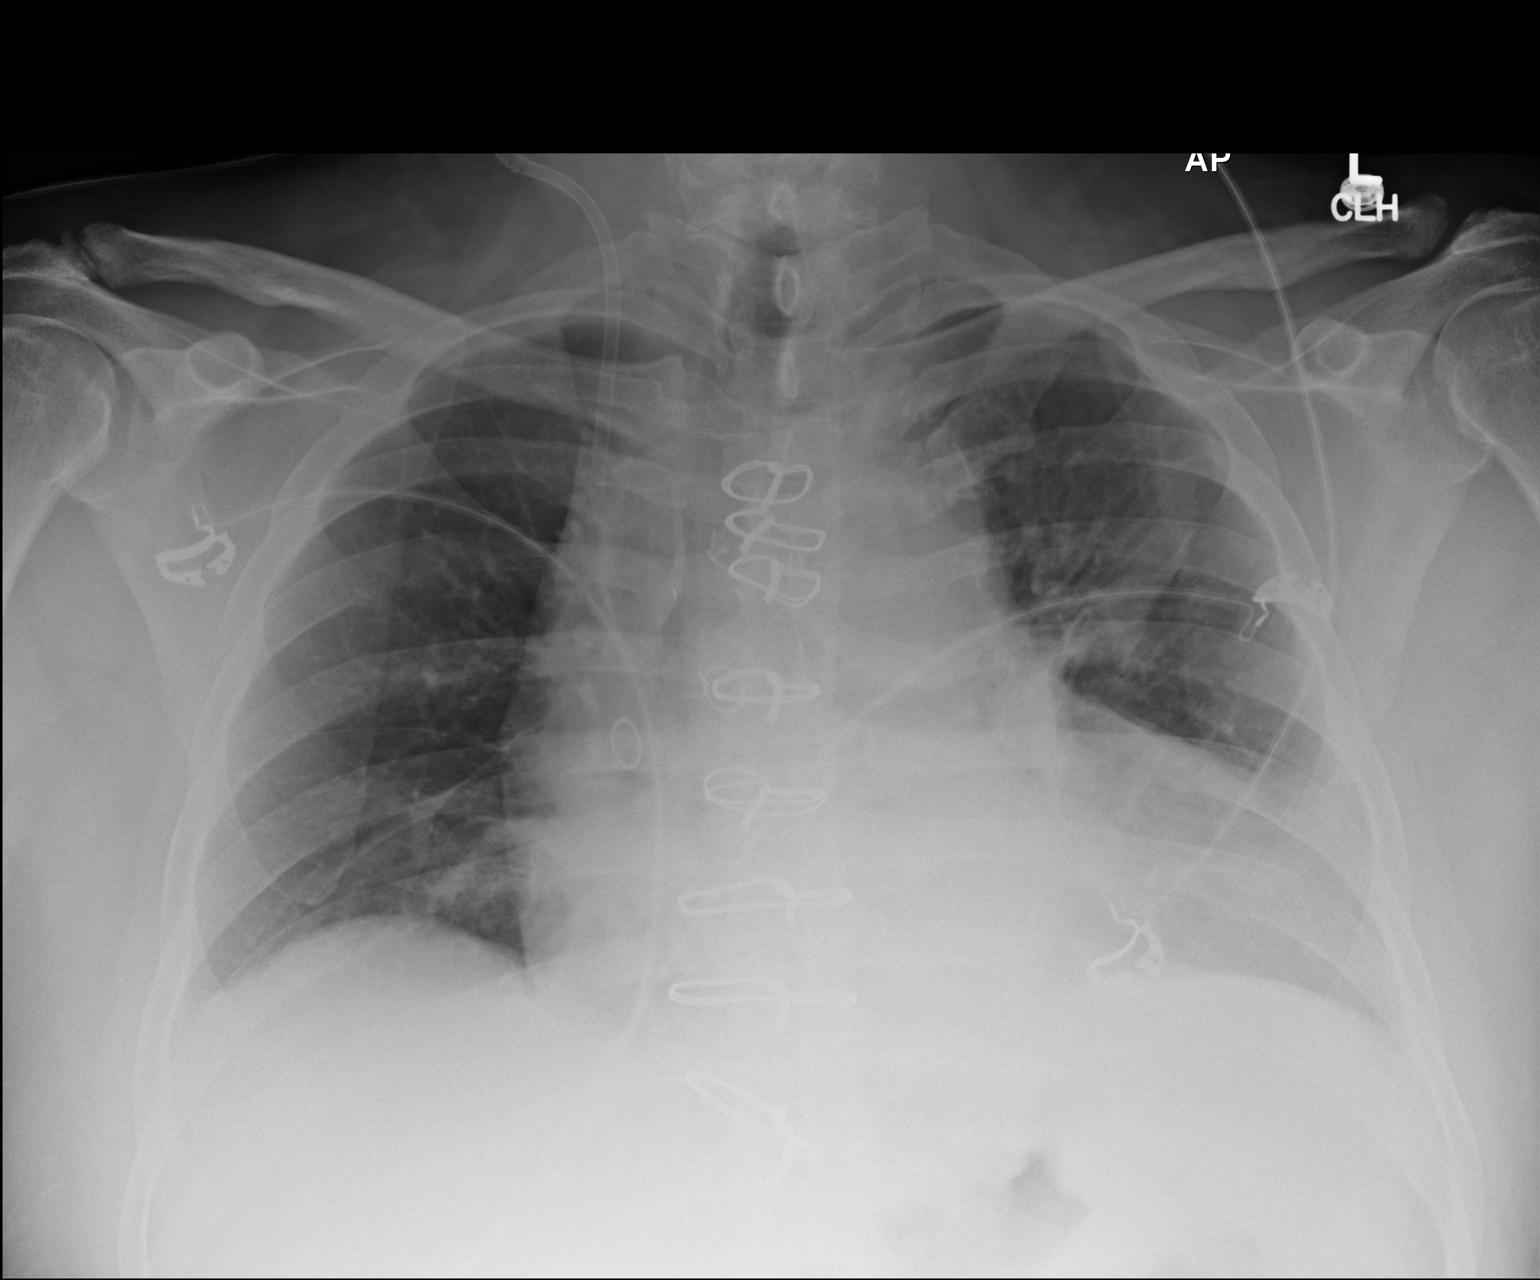

[1 of 1 positions shown; findings below may reference images not displayed]

FINDINGS: The chest tubes were removed. There is no evidence for a
pneumothorax. Swan-Ganz catheter has been removed. Right jugular
central line is still present. Improved aeration in the left lung
with persistent patchy densities. Findings in left lung are most
compatible with atelectasis. Heart size is appears to be is enlarged
and patient has median sternotomy wires.
IMPRESSION: Removal of chest tubes without pneumothorax.

Improving aeration in left lung.

## 2015-02-22 IMAGING — CR DG CHEST 2V
2 series · 2 of 2 positions shown · non-contrast
Comparison: 04/11/2013

CLINICAL DATA: Non ST elevated myocardial infarction. Coronary
artery disease. Hypertension. Postop from coronary bypass grafting
poor

EXAM:
CHEST  2 VIEW

[w chest pa]
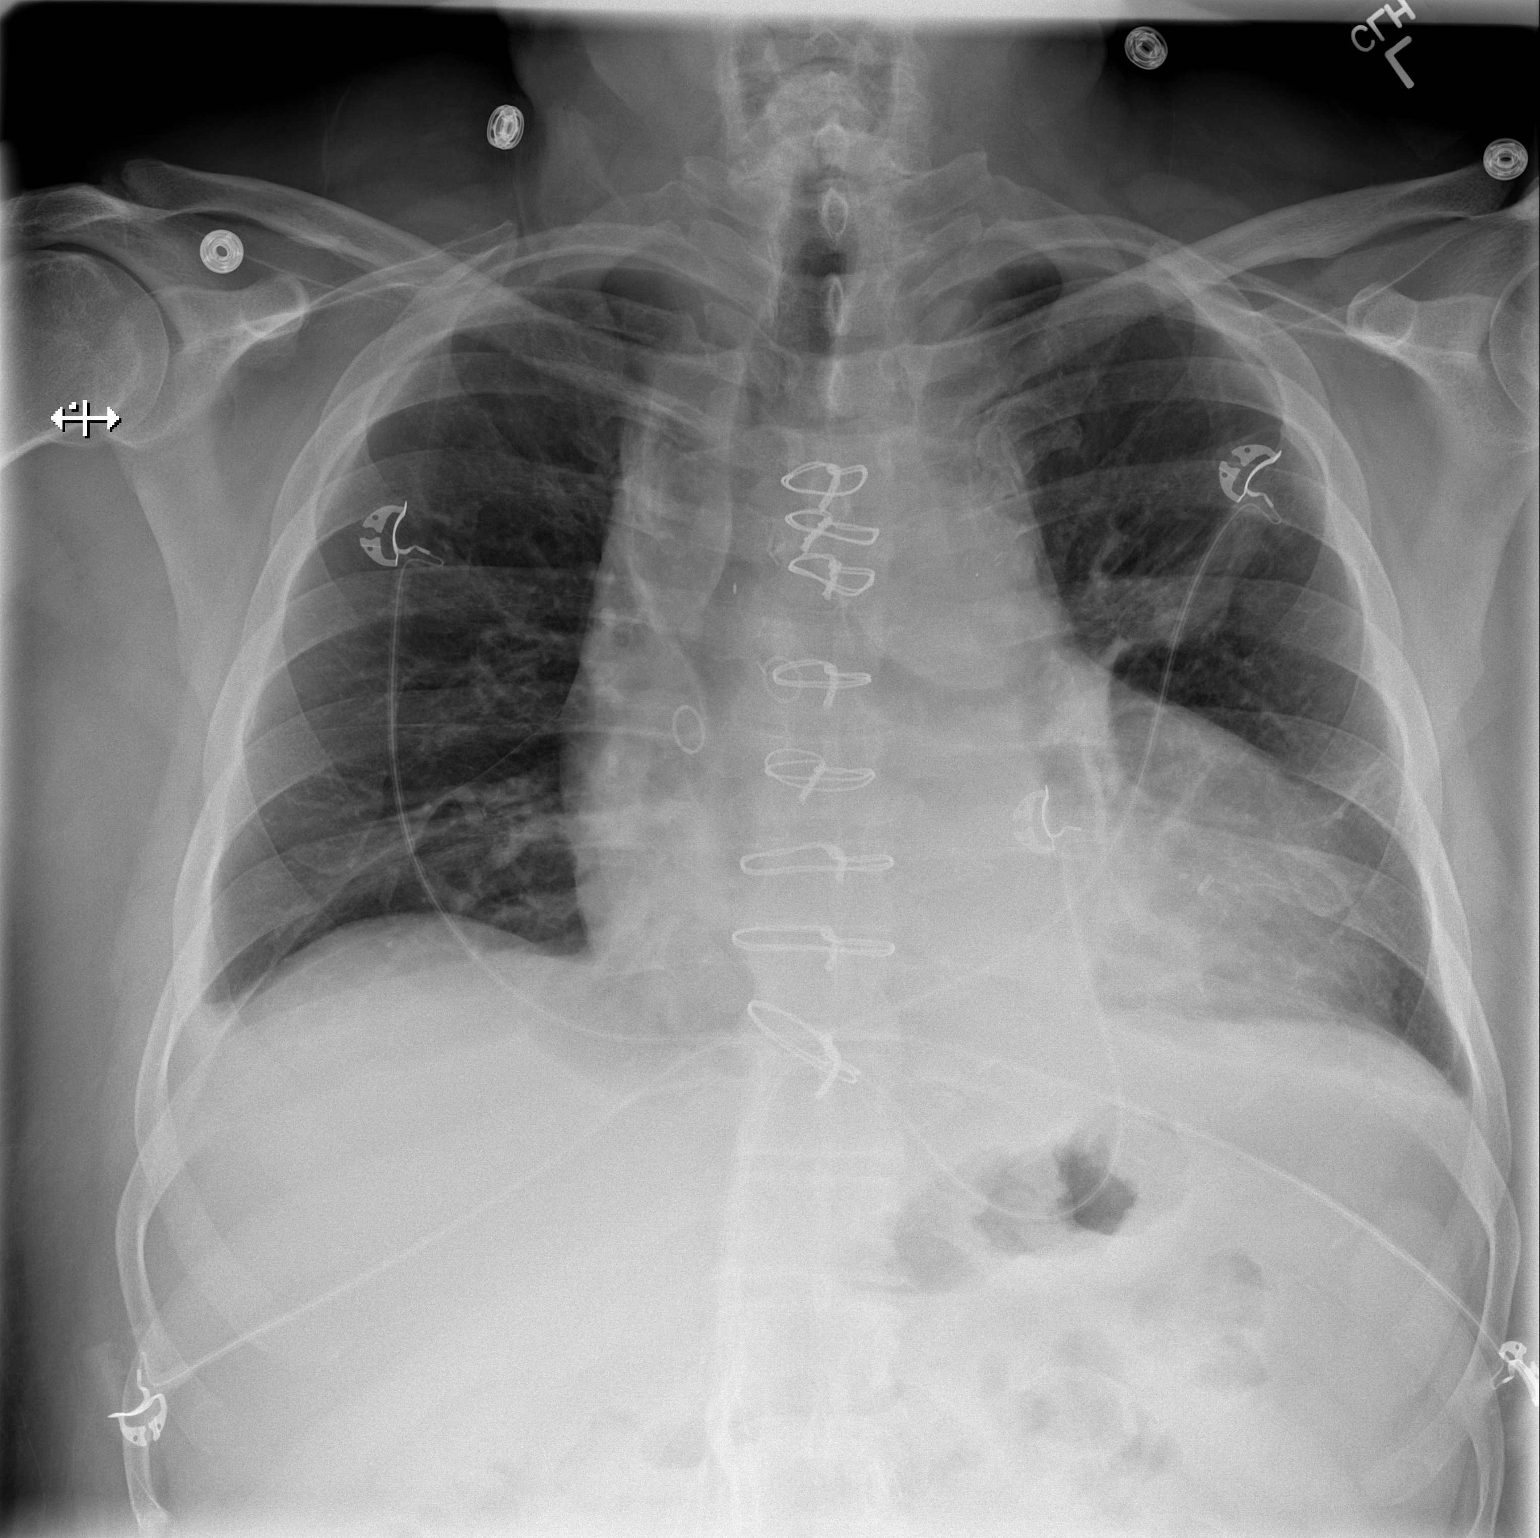

[w chest lat]
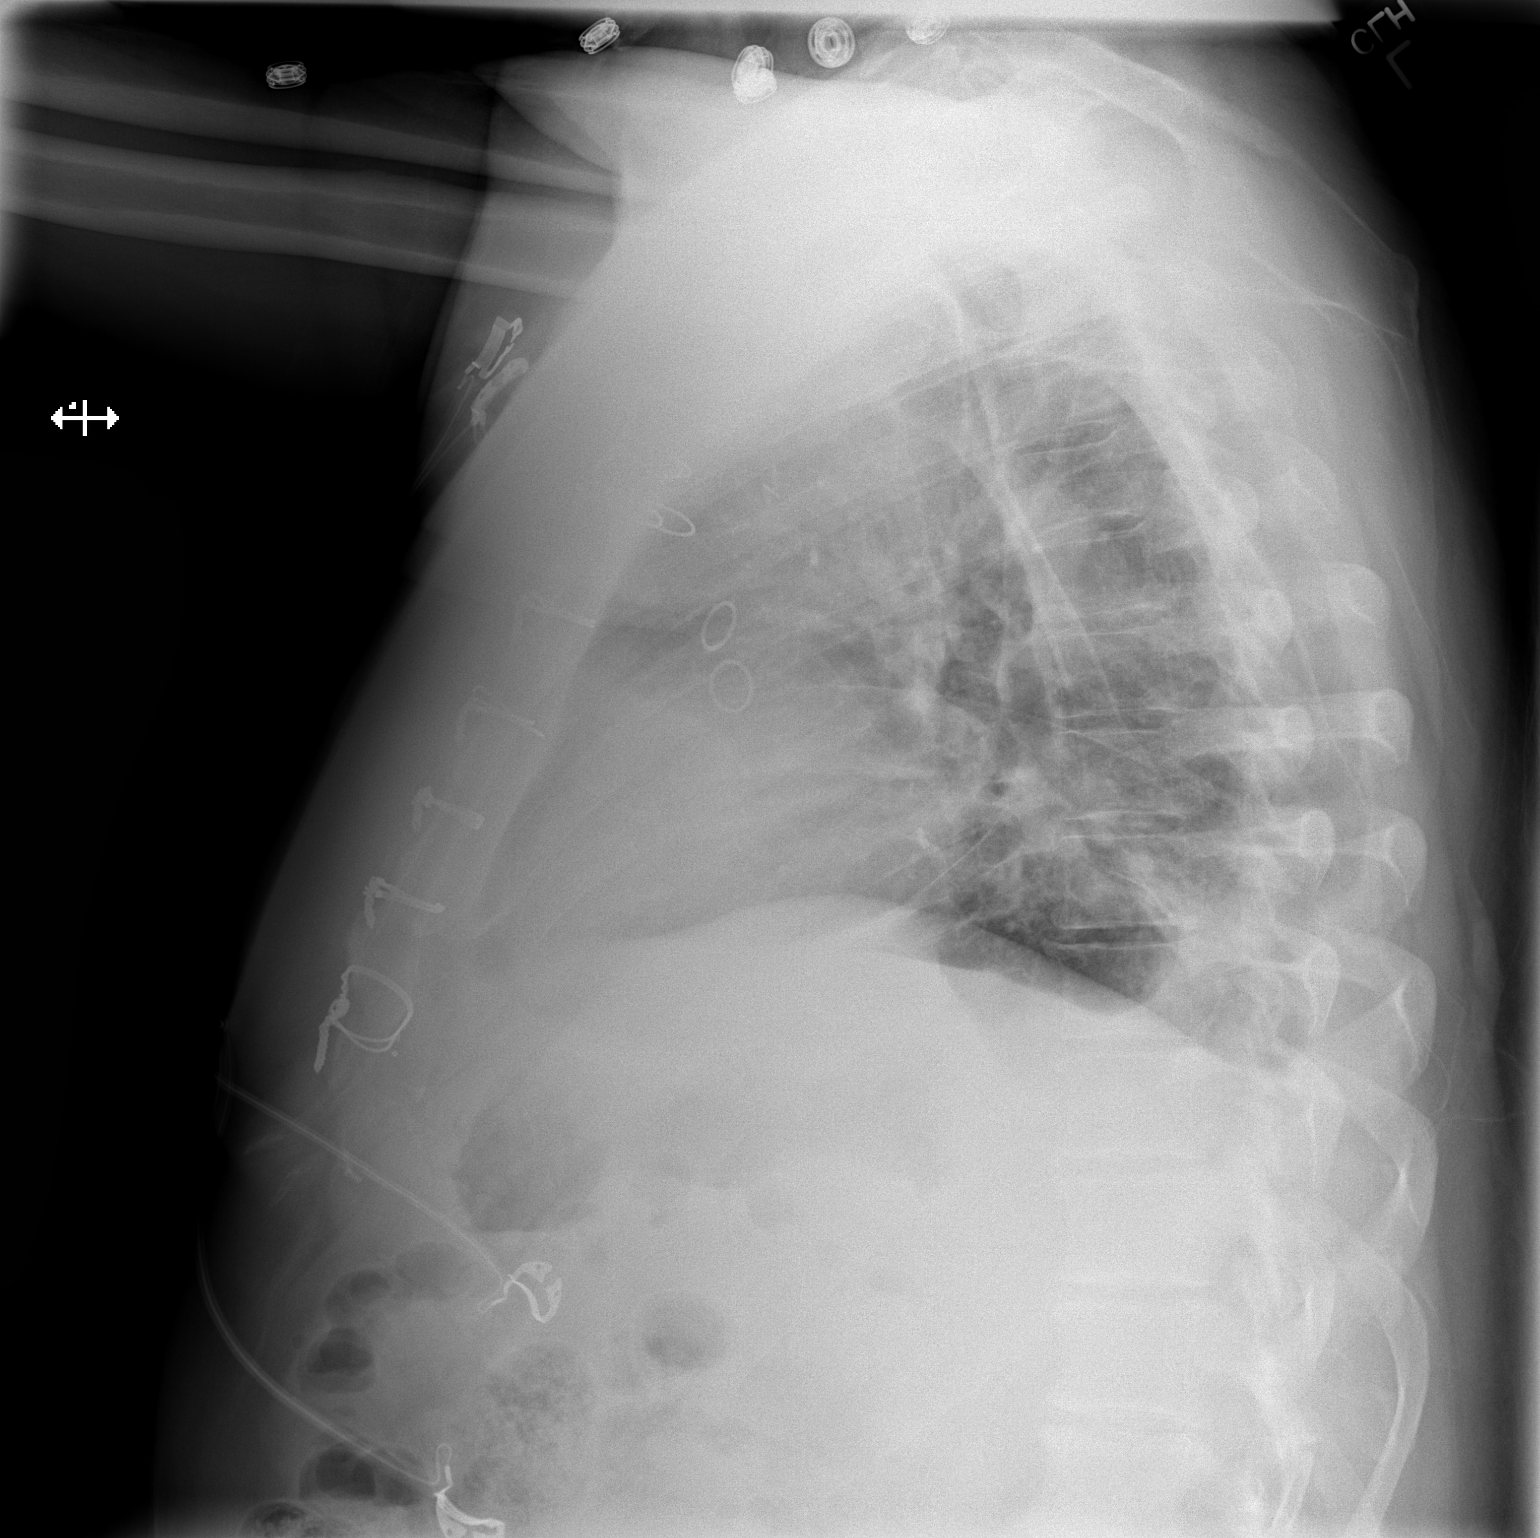

[2 of 2 positions shown; findings below may reference images not displayed]

FINDINGS: Right jugular Cordis is been removed. No pneumothorax identified.
Mild bibasilar and left upper lobe atelectasis shows no significant
change. No evidence of pulmonary edema. Tiny bilateral pleural
effusions remain.
IMPRESSION: No significant change in bilateral atelectasis and tiny bilateral
pleural effusions. No pneumothorax visualized.

## 2016-07-01 ENCOUNTER — Other Ambulatory Visit: Payer: Self-pay | Admitting: Physician Assistant

## 2016-07-01 ENCOUNTER — Ambulatory Visit: Admission: RE | Admit: 2016-07-01 | Payer: BC Managed Care – PPO | Source: Ambulatory Visit | Admitting: *Deleted

## 2016-07-01 DIAGNOSIS — R1084 Generalized abdominal pain: Secondary | ICD-10-CM

## 2016-07-02 ENCOUNTER — Ambulatory Visit
Admission: RE | Admit: 2016-07-02 | Discharge: 2016-07-02 | Disposition: A | Discharge status: Home / Self Care | Payer: BC Managed Care – PPO | Source: Ambulatory Visit | Attending: Physician Assistant | Admitting: Physician Assistant

## 2016-07-02 DIAGNOSIS — I517 Cardiomegaly: Secondary | ICD-10-CM | POA: Diagnosis not present

## 2016-07-02 DIAGNOSIS — R1084 Generalized abdominal pain: Secondary | ICD-10-CM

## 2016-07-02 DIAGNOSIS — I878 Other specified disorders of veins: Secondary | ICD-10-CM | POA: Diagnosis not present

## 2016-07-11 ENCOUNTER — Ambulatory Visit
Admission: RE | Admit: 2016-07-11 | Discharge: 2016-07-11 | Disposition: A | Discharge status: Home / Self Care | Payer: BC Managed Care – PPO | Source: Ambulatory Visit | Attending: Gastroenterology | Admitting: Gastroenterology

## 2016-07-11 ENCOUNTER — Other Ambulatory Visit: Payer: Self-pay | Admitting: Gastroenterology

## 2016-07-11 DIAGNOSIS — R109 Unspecified abdominal pain: Secondary | ICD-10-CM

## 2016-07-11 DIAGNOSIS — R112 Nausea with vomiting, unspecified: Secondary | ICD-10-CM

## 2016-07-11 MED ORDER — IOPAMIDOL (ISOVUE-300) INJECTION 61%
100.0000 mL | Freq: Once | INTRAVENOUS | Status: AC | PRN
  Administered 2016-07-11: 100 mL via INTRAVENOUS

## 2016-07-12 ENCOUNTER — Telehealth: Payer: Self-pay | Admitting: General Practice

## 2016-07-18 ENCOUNTER — Encounter: Payer: Self-pay | Admitting: Pulmonary Disease

## 2016-07-18 ENCOUNTER — Telehealth: Payer: Self-pay | Admitting: Pulmonary Disease

## 2016-07-18 ENCOUNTER — Ambulatory Visit (INDEPENDENT_AMBULATORY_CARE_PROVIDER_SITE_OTHER): Payer: BC Managed Care – PPO | Admitting: Pulmonary Disease

## 2016-07-18 VITALS — BP 138/84 | HR 98 | Ht 72.0 in | Wt 289.6 lb

## 2016-07-18 DIAGNOSIS — I5022 Chronic systolic (congestive) heart failure: Secondary | ICD-10-CM

## 2016-07-18 MED ORDER — FUROSEMIDE 20 MG PO TABS: 20.0000 mg | ORAL_TABLET | Freq: Every day | ORAL | 1 refills | Status: DC

## 2016-07-18 MED ORDER — ASPIRIN 81 MG PO TABS: 81.0000 mg | ORAL_TABLET | Freq: Every day | ORAL | 5 refills | Status: DC

## 2016-07-18 NOTE — Progress Notes (Signed)
   Subjective:    Patient ID: Joshua [name], male    DOB: 01-03-1961, 56 y.o.   MRN: [id number]  HPI    Review of Systems  Constitutional: Negative for fever and unexpected weight change.  HENT: Negative for congestion, dental problem, ear pain, nosebleeds, postnasal drip, rhinorrhea, sinus pressure, sneezing, sore throat and trouble swallowing.   Eyes: Negative for redness and itching.  Respiratory: Positive for shortness of breath. Negative for cough, chest tightness and wheezing.        Fluid on lungs  Cardiovascular: Positive for leg swelling. Negative for palpitations.  Gastrointestinal: Positive for abdominal pain. Negative for nausea and vomiting.       Abd bloating/swelling  Genitourinary: Negative for dysuria.  Musculoskeletal: Negative for joint swelling.  Skin: Negative for rash.  Neurological: Negative for headaches.  Hematological: Does not bruise/bleed easily.  Psychiatric/Behavioral: Negative for dysphoric mood. The patient is not nervous/anxious.        Objective:   Physical Exam        Assessment & Plan:

## 2016-07-18 NOTE — Progress Notes (Signed)
Past surgical history He  has a past surgical history that includes Coronary artery bypass graft (N/A, 04/09/2013) and Intraoprative transesophageal echocardiogram (N/A, 04/09/2013).  Family history His family history includes Cancer in his mother; Coronary artery disease in his father; Heart attack in his paternal uncle; Hypertension in his father.  Social history He  reports that he has never smoked. He has never used smokeless tobacco. He reports that he does not drink alcohol or use drugs.  No Known Allergies   Review of Systems  Constitutional: Negative for fever and unexpected weight change.  HENT: Negative for congestion, dental problem, ear pain, nosebleeds, postnasal drip, rhinorrhea, sinus pressure, sneezing, sore throat and trouble swallowing.   Eyes: Negative for redness and itching.  Respiratory: Positive for shortness of breath. Negative for cough, chest tightness and wheezing.        Fluid on lungs  Cardiovascular: Positive for leg swelling. Negative for palpitations.  Gastrointestinal: Positive for abdominal pain. Negative for nausea and vomiting.       Abd bloating/swelling  Genitourinary: Negative for dysuria.  Musculoskeletal: Negative for joint swelling.  Skin: Negative for rash.  Neurological: Negative for headaches.  Hematological: Does not bruise/bleed easily.  Psychiatric/Behavioral: Negative for dysphoric mood. The patient is not nervous/anxious.    No current outpatient prescriptions on file prior to visit.   No current facility-administered medications on file prior to visit.     Chief Complaint  Patient presents with  . PULMONARY CONSULT    Referred by Dr Levora Angel for fluid on lungs. Leg and abdominal swelling.     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  Vital signs BP 138/84 (BP Location: Right Arm, Cuff Size: Large)   Pulse 98   Ht 6' (1.829 m)   Wt 289 lb 9.6 oz (131.4 kg)   SpO2 96%   BMI 39.28 kg/m   History of present illness Joshua [name] is a 56 y.o. male with dyspnea.  He has history of CAD and had CABG in 2015 with Dr. Barry Dienes.  He was noted to have systolic CHF with EF 45%.  He didn't have insurance and was not able to afford his medications.  He was lost to follow up.  He started developed shortness of breath.  This was associated with abdominal bloating, and leg swelling.  He denies chest pain, palpitations, or cough.  He has to sleep on his side.  He got a new job and was able to get insurance.  He has been missing lots of work due to his health problems, and is worried he might lose his job.    He denies smoking, or alcohol use.  He denies sputum, wheeze, fever, or hemoptysis.  He gets occasional diarrhea.  He was seen by GI.  He had CT abd/pelvis which showed fluid overload.  He is not aware of having DM or thyroid disease.  He doesn't think he has an issue with his breathing while asleep, such as sleep apnea.   Physical exam  General - No distress Eyes - pupils reactive ENT - No sinus  tenderness, no oral exudate, no LAN, no thyromegaly, TM clear, pupils equal/reactive Cardiac - s1s2 regular, no murmur, pulses symmetric Chest - decreased BS at bases, no wheeze Back - No focal tenderness Abd - Soft, non-tender, no organomegaly, + bowel sounds Ext - 1+ pitting edema Neuro - Normal strength, cranial nerves intact Skin - No rashes Psych - Normal mood, and behavior   CMP Latest Ref Rng & Units  04/12/2013 04/11/2013 04/10/2013  Glucose 70 - 99 mg/dL 696(E) 952(W) 413(K)  BUN 6 - 23 mg/dL 19 17 16   Creatinine 0.50 - 1.35 mg/dL 4.40 1.02 7.25  Sodium 137 - 147 mEq/L 138 138 139  Potassium 3.7 - 5.3 mEq/L 4.4 4.2 4.0  Chloride 96 - 112 mEq/L 99 100 101  CO2 19 - 32 mEq/L 27 25 -  Calcium 8.4 - 10.5 mg/dL 8.6 3.6(U) -  Total Protein 6.0 - 8.3 g/dL - - -  Total Bilirubin 0.3 - 1.2 mg/dL - - -  Alkaline Phos 39 - 117 U/L - - -  AST 0 - 37 U/L - - -  ALT 0 - 53 U/L - - -     CBC Latest Ref Rng & Units 04/12/2013 04/11/2013 04/10/2013  WBC 4.0 - 10.5 K/uL 9.0 7.5 -  Hemoglobin 13.0 - 17.0 g/dL 12.0(L) 10.9(L) 11.2(L)  Hematocrit 39.0 - 52.0 % 35.1(L) 31.9(L) 33.0(L)  Platelets 150 - 400 K/uL 214 130(L) -     ABG    Component Value Date/Time   PHART 7.327 (L) 04/10/2013 0411   PCO2ART 43.9 04/10/2013 0411   PO2ART 83.0 04/10/2013 0411   HCO3 22.8 04/10/2013 0411   TCO2 26 04/10/2013 1642   ACIDBASEDEF 3.0 (H) 04/10/2013 0411   O2SAT 95.0 04/10/2013 0411    Ct Abdomen Pelvis W Contrast  Result Date: 07/11/2016 CLINICAL DATA:  56 year old male with history of nausea and vomiting with generalize abdominal pain and bloating for the past 3 days. Evaluate for potential colitis or acute appendicitis. EXAM: CT ABDOMEN AND PELVIS WITH CONTRAST TECHNIQUE: Multidetector CT imaging of the abdomen and pelvis was performed using the standard protocol following bolus administration of intravenous contrast. CONTRAST:  ISOVUE-300 IOPAMIDOL (ISOVUE-300) INJECTION 61% COMPARISON:  No priors. FINDINGS: Lower chest: Cardiomegaly. Moderate to large right and small left pleural effusions lying dependently. Median sternotomy wires. Hepatobiliary: No suspicious cystic or solid hepatic lesions. No intra or extrahepatic biliary ductal dilatation. Gallbladder is normal in appearance. Pancreas: No pancreatic mass. No pancreatic ductal dilatation. No pancreatic or peripancreatic fluid or inflammatory  changes. Spleen: Unremarkable. Adrenals/Urinary Tract: Bilateral adrenal glands and bilateral kidneys are normal in appearance. There is no hydroureteronephrosis. Urinary bladder is normal in appearance. Stomach/Bowel: The appearance of the stomach is normal. There is no pathologic dilatation of small bowel or colon. Normal appendix. Vascular/Lymphatic: Aortic atherosclerosis, without evidence of aneurysm or dissection in the abdominal or pelvic vasculature. No lymphadenopathy noted in the abdomen or pelvis. Reproductive: Prostate gland and seminal vesicles are unremarkable in appearance. Other: Small volume of ascites.  No pneumoperitoneum. Musculoskeletal: There are no aggressive appearing lytic or blastic lesions noted in the visualized portions of the skeleton. IMPRESSION: 1. Small volume of ascites. Diffuse body wall edema. Moderate to large right and small left pleural effusions. Findings may suggest a state of anasarca. 2. Normal appendix. 3. No definite imaging findings to suggest colitis at this time. 4. Aortic atherosclerosis. 5. Additional incidental findings, as above. Electronically Signed   By: Trudie Reed M.D.   On:  07/11/2016 12:15    Discussion He has hx of CAD and systolic CHF.  He had b/l pleural effusions and peripheral edema associated with dyspnea.  I think he needs further cardiology assessment first before pursuing any additional pulmonary evaluation.   Assessment/plan  Systolic CHF with hx of CAD s/p CABG. - will arrange for referral to cardiology - will have him start aspirin and lasix 20 mg daily - he will need f/u BMET next week - further interventions to be determined after he has evaluation by cardiology   Patient Instructions  Furosemide (fluid pill) 20 mg daily Aspirin 81 mg daily Will need lab test next week to check kidney function Will arrange for appointment with cardiology - needs to be scheduled within 1 week  Follow up in 2 months    Coralyn Helling,  MD  Pulmonary/Critical Care/Sleep Pager:  [phone] 07/18/2016, 1:05 PM

## 2016-07-18 NOTE — Telephone Encounter (Signed)
Spoke with pt and he stated that Dr. Craige Cotta wanted pt to have his most recent labs from Dr. Ardeen Garland office faxed to [place]. Contacted their office and requested the most recent blood work to be faxed. Was told main fax machine is working provided that number. Will forward to [place] to be on the look out

## 2016-07-18 NOTE — Patient Instructions (Signed)
Furosemide (fluid pill) 20 mg daily Aspirin 81 mg daily Will need lab test next week to check kidney function Will arrange for appointment with cardiology - needs to be scheduled within 1 week  Follow up in 2 months

## 2016-07-19 ENCOUNTER — Encounter: Payer: Self-pay | Admitting: Cardiology

## 2016-07-21 ENCOUNTER — Encounter (HOSPITAL_COMMUNITY): Payer: Self-pay

## 2016-07-21 ENCOUNTER — Emergency Department (HOSPITAL_COMMUNITY): Payer: BC Managed Care – PPO

## 2016-07-21 ENCOUNTER — Inpatient Hospital Stay (HOSPITAL_COMMUNITY)
Admission: EM | Admit: 2016-07-21 | Discharge: 2016-07-27 | DRG: 286 | Disposition: A | Discharge status: Home / Self Care | Payer: BC Managed Care – PPO | Attending: Internal Medicine | Admitting: Internal Medicine

## 2016-07-21 ENCOUNTER — Inpatient Hospital Stay (HOSPITAL_COMMUNITY): Payer: BC Managed Care – PPO

## 2016-07-21 DIAGNOSIS — I2510 Atherosclerotic heart disease of native coronary artery without angina pectoris: Secondary | ICD-10-CM | POA: Diagnosis present

## 2016-07-21 DIAGNOSIS — I509 Heart failure, unspecified: Secondary | ICD-10-CM

## 2016-07-21 DIAGNOSIS — I10 Essential (primary) hypertension: Secondary | ICD-10-CM | POA: Diagnosis present

## 2016-07-21 DIAGNOSIS — K219 Gastro-esophageal reflux disease without esophagitis: Secondary | ICD-10-CM | POA: Diagnosis present

## 2016-07-21 DIAGNOSIS — Z6836 Body mass index (BMI) 36.0-36.9, adult: Secondary | ICD-10-CM

## 2016-07-21 DIAGNOSIS — I110 Hypertensive heart disease with heart failure: Secondary | ICD-10-CM | POA: Diagnosis present

## 2016-07-21 DIAGNOSIS — Z79899 Other long term (current) drug therapy: Secondary | ICD-10-CM

## 2016-07-21 DIAGNOSIS — N179 Acute kidney failure, unspecified: Secondary | ICD-10-CM | POA: Diagnosis present

## 2016-07-21 DIAGNOSIS — K7581 Nonalcoholic steatohepatitis (NASH): Secondary | ICD-10-CM | POA: Diagnosis present

## 2016-07-21 DIAGNOSIS — M19012 Primary osteoarthritis, left shoulder: Secondary | ICD-10-CM | POA: Diagnosis present

## 2016-07-21 DIAGNOSIS — M549 Dorsalgia, unspecified: Secondary | ICD-10-CM | POA: Diagnosis present

## 2016-07-21 DIAGNOSIS — Z951 Presence of aortocoronary bypass graft: Secondary | ICD-10-CM

## 2016-07-21 DIAGNOSIS — I493 Ventricular premature depolarization: Secondary | ICD-10-CM | POA: Diagnosis present

## 2016-07-21 DIAGNOSIS — Z9114 Patient's other noncompliance with medication regimen: Secondary | ICD-10-CM | POA: Diagnosis not present

## 2016-07-21 DIAGNOSIS — R0600 Dyspnea, unspecified: Secondary | ICD-10-CM | POA: Diagnosis not present

## 2016-07-21 DIAGNOSIS — Z87891 Personal history of nicotine dependence: Secondary | ICD-10-CM

## 2016-07-21 DIAGNOSIS — Z9119 Patient's noncompliance with other medical treatment and regimen: Secondary | ICD-10-CM

## 2016-07-21 DIAGNOSIS — E876 Hypokalemia: Secondary | ICD-10-CM | POA: Diagnosis present

## 2016-07-21 DIAGNOSIS — I5023 Acute on chronic systolic (congestive) heart failure: Secondary | ICD-10-CM | POA: Diagnosis not present

## 2016-07-21 DIAGNOSIS — R601 Generalized edema: Secondary | ICD-10-CM | POA: Insufficient documentation

## 2016-07-21 DIAGNOSIS — I161 Hypertensive emergency: Secondary | ICD-10-CM | POA: Diagnosis present

## 2016-07-21 DIAGNOSIS — E6609 Other obesity due to excess calories: Secondary | ICD-10-CM | POA: Diagnosis present

## 2016-07-21 DIAGNOSIS — I5021 Acute systolic (congestive) heart failure: Secondary | ICD-10-CM | POA: Diagnosis not present

## 2016-07-21 DIAGNOSIS — J9601 Acute respiratory failure with hypoxia: Secondary | ICD-10-CM | POA: Diagnosis present

## 2016-07-21 DIAGNOSIS — I252 Old myocardial infarction: Secondary | ICD-10-CM

## 2016-07-21 DIAGNOSIS — G8929 Other chronic pain: Secondary | ICD-10-CM | POA: Diagnosis present

## 2016-07-21 DIAGNOSIS — I5043 Acute on chronic combined systolic (congestive) and diastolic (congestive) heart failure: Secondary | ICD-10-CM | POA: Diagnosis present

## 2016-07-21 DIAGNOSIS — J9811 Atelectasis: Secondary | ICD-10-CM | POA: Diagnosis present

## 2016-07-21 DIAGNOSIS — E669 Obesity, unspecified: Secondary | ICD-10-CM | POA: Diagnosis present

## 2016-07-21 DIAGNOSIS — R7303 Prediabetes: Secondary | ICD-10-CM | POA: Diagnosis present

## 2016-07-21 DIAGNOSIS — G4733 Obstructive sleep apnea (adult) (pediatric): Secondary | ICD-10-CM | POA: Diagnosis present

## 2016-07-21 DIAGNOSIS — M19011 Primary osteoarthritis, right shoulder: Secondary | ICD-10-CM | POA: Diagnosis present

## 2016-07-21 DIAGNOSIS — J9600 Acute respiratory failure, unspecified whether with hypoxia or hypercapnia: Secondary | ICD-10-CM | POA: Diagnosis present

## 2016-07-21 DIAGNOSIS — Z7982 Long term (current) use of aspirin: Secondary | ICD-10-CM | POA: Diagnosis not present

## 2016-07-21 DIAGNOSIS — D509 Iron deficiency anemia, unspecified: Secondary | ICD-10-CM | POA: Diagnosis present

## 2016-07-21 DIAGNOSIS — Z8249 Family history of ischemic heart disease and other diseases of the circulatory system: Secondary | ICD-10-CM

## 2016-07-21 DIAGNOSIS — Z87442 Personal history of urinary calculi: Secondary | ICD-10-CM

## 2016-07-21 DIAGNOSIS — R7302 Impaired glucose tolerance (oral): Secondary | ICD-10-CM | POA: Diagnosis present

## 2016-07-21 DIAGNOSIS — Z713 Dietary counseling and surveillance: Secondary | ICD-10-CM

## 2016-07-21 DIAGNOSIS — I428 Other cardiomyopathies: Secondary | ICD-10-CM | POA: Diagnosis present

## 2016-07-21 HISTORY — DX: Heart failure, unspecified: I50.9

## 2016-07-21 HISTORY — DX: Generalized edema: R60.1

## 2016-07-21 LAB — CBC
HCT: 39.3 % (ref 39.0–52.0)
Hemoglobin: 11.7 g/dL — ABNORMAL LOW (ref 13.0–17.0)
MCH: 21.8 pg — AB (ref 26.0–34.0)
MCHC: 29.8 g/dL — ABNORMAL LOW (ref 30.0–36.0)
MCV: 73.3 fL — AB (ref 78.0–100.0)
PLATELETS: 317 10*3/uL (ref 150–400)
RBC: 5.36 MIL/uL (ref 4.22–5.81)
RDW: 18 % — ABNORMAL HIGH (ref 11.5–15.5)
WBC: 9.4 10*3/uL (ref 4.0–10.5)

## 2016-07-21 LAB — HIV ANTIBODY (ROUTINE TESTING W REFLEX): HIV SCREEN 4TH GENERATION: NONREACTIVE

## 2016-07-21 LAB — HEPATIC FUNCTION PANEL
ALBUMIN: 3.1 g/dL — AB (ref 3.5–5.0)
ALT: 19 U/L (ref 17–63)
AST: 26 U/L (ref 15–41)
Alkaline Phosphatase: 90 U/L (ref 38–126)
Bilirubin, Direct: 0.4 mg/dL (ref 0.1–0.5)
Indirect Bilirubin: 1.3 mg/dL — ABNORMAL HIGH (ref 0.3–0.9)
TOTAL PROTEIN: 5.7 g/dL — AB (ref 6.5–8.1)
Total Bilirubin: 1.7 mg/dL — ABNORMAL HIGH (ref 0.3–1.2)

## 2016-07-21 LAB — I-STAT TROPONIN, ED: TROPONIN I, POC: 0.02 ng/mL (ref 0.00–0.08)

## 2016-07-21 LAB — BASIC METABOLIC PANEL
Anion gap: 13 (ref 5–15)
BUN: 13 mg/dL (ref 6–20)
CALCIUM: 8.7 mg/dL — AB (ref 8.9–10.3)
CHLORIDE: 100 mmol/L — AB (ref 101–111)
CO2: 23 mmol/L (ref 22–32)
CREATININE: 1.47 mg/dL — AB (ref 0.61–1.24)
GFR calc Af Amer: 60 mL/min — ABNORMAL LOW (ref >60)
GFR calc non Af Amer: 52 mL/min — ABNORMAL LOW (ref >60)
Glucose, Bld: 95 mg/dL (ref 65–99)
Potassium: 4 mmol/L (ref 3.5–5.1)
Sodium: 136 mmol/L (ref 135–145)

## 2016-07-21 LAB — LIPID PANEL
CHOL/HDL RATIO: 5.9 ratio
CHOLESTEROL: 118 mg/dL (ref 0–200)
HDL: 20 mg/dL — ABNORMAL LOW (ref >40)
LDL CALC: 81 mg/dL (ref 0–99)
Triglycerides: 83 mg/dL (ref <150)
VLDL: 17 mg/dL (ref 0–40)

## 2016-07-21 LAB — ECHOCARDIOGRAM COMPLETE
Height: 72 in
WEIGHTICAEL: 4426.84 [oz_av]

## 2016-07-21 LAB — BRAIN NATRIURETIC PEPTIDE: B NATRIURETIC PEPTIDE 5: 30.6 pg/mL (ref 0.0–100.0)

## 2016-07-21 LAB — TROPONIN I
TROPONIN I: 0.04 ng/mL — AB (ref <0.03)
Troponin I: 0.03 ng/mL (ref <0.03)
Troponin I: 0.03 ng/mL (ref <0.03)

## 2016-07-21 LAB — MAGNESIUM: Magnesium: 1.5 mg/dL — ABNORMAL LOW (ref 1.7–2.4)

## 2016-07-21 MED ORDER — SODIUM CHLORIDE 0.9% FLUSH: 3.0000 mL | INTRAVENOUS | Status: DC | PRN

## 2016-07-21 MED ORDER — SODIUM CHLORIDE 0.9% FLUSH
3.0000 mL | Freq: Two times a day (BID) | INTRAVENOUS | Status: DC
  Administered 2016-07-21 – 2016-07-27 (×12): 3 mL via INTRAVENOUS

## 2016-07-21 MED ORDER — HYDRALAZINE HCL 20 MG/ML IJ SOLN: 10.0000 mg | INTRAMUSCULAR | Status: DC | PRN

## 2016-07-21 MED ORDER — ONDANSETRON HCL 4 MG/2ML IJ SOLN: 4.0000 mg | Freq: Four times a day (QID) | INTRAMUSCULAR | Status: DC | PRN

## 2016-07-21 MED ORDER — FUROSEMIDE 10 MG/ML IJ SOLN
20.0000 mg | Freq: Four times a day (QID) | INTRAMUSCULAR | Status: DC
  Administered 2016-07-21 – 2016-07-22 (×4): 20 mg via INTRAVENOUS
  Filled 2016-07-21 (×4): qty 2

## 2016-07-21 MED ORDER — LISINOPRIL 10 MG PO TABS
10.0000 mg | ORAL_TABLET | Freq: Every day | ORAL | Status: DC
  Administered 2016-07-22 – 2016-07-23 (×2): 10 mg via ORAL
  Filled 2016-07-21 (×2): qty 1

## 2016-07-21 MED ORDER — ASPIRIN EC 81 MG PO TBEC
81.0000 mg | DELAYED_RELEASE_TABLET | Freq: Every day | ORAL | Status: DC
  Administered 2016-07-21 – 2016-07-27 (×7): 81 mg via ORAL
  Filled 2016-07-21 (×8): qty 1

## 2016-07-21 MED ORDER — ENOXAPARIN SODIUM 40 MG/0.4ML ~~LOC~~ SOLN
40.0000 mg | SUBCUTANEOUS | Status: DC
  Administered 2016-07-21 – 2016-07-22 (×2): 40 mg via SUBCUTANEOUS
  Filled 2016-07-21 (×2): qty 0.4

## 2016-07-21 MED ORDER — SODIUM CHLORIDE 0.9 % IV SOLN: 250.0000 mL | INTRAVENOUS | Status: DC | PRN

## 2016-07-21 MED ORDER — FUROSEMIDE 10 MG/ML IJ SOLN
40.0000 mg | Freq: Once | INTRAMUSCULAR | Status: AC
  Administered 2016-07-21: 40 mg via INTRAVENOUS
  Filled 2016-07-21: qty 4

## 2016-07-21 MED ORDER — SIMVASTATIN 20 MG PO TABS
20.0000 mg | ORAL_TABLET | Freq: Every day | ORAL | Status: DC
  Administered 2016-07-21 – 2016-07-22 (×2): 20 mg via ORAL
  Filled 2016-07-21 (×2): qty 1

## 2016-07-21 MED ORDER — LISINOPRIL 20 MG PO TABS
20.0000 mg | ORAL_TABLET | Freq: Once | ORAL | Status: AC
  Administered 2016-07-21: 20 mg via ORAL
  Filled 2016-07-21: qty 1

## 2016-07-21 MED ORDER — ACETAMINOPHEN 325 MG PO TABS
650.0000 mg | ORAL_TABLET | ORAL | Status: DC | PRN
  Administered 2016-07-24: 650 mg via ORAL
  Filled 2016-07-21: qty 2

## 2016-07-21 MED ORDER — NITROGLYCERIN 2 % TD OINT
1.0000 [in_us] | TOPICAL_OINTMENT | Freq: Once | TRANSDERMAL | Status: AC
  Administered 2016-07-21: 1 [in_us] via TOPICAL
  Filled 2016-07-21: qty 1

## 2016-07-21 MED ORDER — NITROGLYCERIN 2 % TD OINT
1.0000 [in_us] | TOPICAL_OINTMENT | Freq: Once | TRANSDERMAL | Status: AC
  Administered 2016-07-21: 1 [in_us] via TOPICAL
  Filled 2016-07-21: qty 30

## 2016-07-21 NOTE — ED Notes (Signed)
Ambulated patient in the hallway.  Patient did not complain of any dizziness associated with drop of Oxygen level during ambulation. Sp02 dropped to 84% during ambulation. When patient sat down Sp02 would rise to 94%.

## 2016-07-21 NOTE — H&P (Signed)
History and Physical    Joshua [name] XYB:[id number] DOB: 08/19/1960 DOA: 07/21/2016  PCP: No PCP Per Patient Patient coming from: home  Chief Complaint: edema and sob  HPI: Joshua [name] is a very pleasant 56 y.o. male with medical history significant for systolic CHF, CAD status post CABG 2015, hypertension, NSTEMI 2015 [facility] emergency Department chief complaint of persistent worsening lower extremity edema and shortness of breath. Initial evaluation reveals acute respiratory failure likely related to hypertensive emergency, acute on chronic systolic heart failure, acute kidney injury.  Information is obtained from the patient. He states that in 2015 he had "a heart attack and heart surgery". He states he has been noncompliant with follow-up and medications due to no insurance. He states over the last several months he's experienced intermittent lower extremity edema. Over the last 2 weeks lower extremity edema has been persistent and worsening. Last week he developed abdominal pain and went to see his PCP who referred him to pulmonology. Pulmonology started him on aspirin and Lasix 3 days ago. He states compliance without relief. He denies chest pain palpitation cough headache dizziness syncope or near-syncope. He reports having had a CT of his abdomen recently due to abdominal pain. He denies dysuria hematuria frequency or urgency. He denies diarrhea constipation melena or bright red blood per rectum. He reports he has an appointment see cardiologist 07/23/2016.  ED Course: In emergency department he is quite hypertensive mildly tachycardic and hypoxic with ambulation with an oxygen saturation level of 88%. He is provided with 20 mg of Lasix intravenously 10 mg of lisinopril 1 inch of Nitropaste. He has been urinating since to Lasix and blood pressure is slightly improved  Review of Systems: As per HPI otherwise 10 point review of systems negative.   Ambulatory Status: He ambulates independently is  independent with ADLs no recent falls  Past Medical History:  Diagnosis Date  . Acid reflux   . Anasarca   . Arthritis    SHOULDERS  . CHF (congestive heart failure) (HCC)   . Coronary artery disease 04/07/2013   cath by [facility]. Lady Gary [facility]  . Essential hypertension, benign 04/06/2013  . Hypertension   . Kidney stone   . Non-STEMI (non-ST elevated myocardial infarction) (HCC) 04/06/2013  . NSTEMI (non-ST elevated myocardial infarction) (HCC)   . Obesity, unspecified 04/07/2013  . 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   LIMA to LAD, SVG to OM2, SVG to OM3, SVG to PDA, EVH via right thigh and bilateral lower legs    Past Surgical History:  Procedure Laterality Date  . CORONARY ARTERY BYPASS GRAFT N/A 04/09/2013   Procedure: CORONARY ARTERY BYPASS GRAFTING (CABG);  Surgeon: Purcell Nails, MD;  Location: [facility] OR;  Service: Open Heart Surgery;  Laterality: N/A;  CABG x four,  using left internal mammary artery and right and left leg greater saphenous vein harvested endoscopically  . INTRAOPERATIVE TRANSESOPHAGEAL ECHOCARDIOGRAM N/A 04/09/2013   Procedure: INTRAOPERATIVE TRANSESOPHAGEAL ECHOCARDIOGRAM;  Surgeon: Purcell Nails, MD;  Location: [facility] OR;  Service: Open Heart Surgery;  Laterality: N/A;    Social History   Social History  . Marital status: Single    Spouse name: N/A  . Number of children: N/A  . Years of education: N/A   Occupational History  . house remodeler      currently out of work  .  [employer]   Social History Main Topics  . Smoking status: Never Smoker  . Smokeless tobacco: Never Used  Comment: iI  DIPPED TOBACCO AT ONE TOME"  . Alcohol use No  . Drug use: No  . Sexual activity: Not on file   Other Topics Concern  . 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 Cancer Mother   . Coronary artery disease Father    . Hypertension Father   . Heart attack Paternal Uncle     Prior to Admission medications   Medication Sig Start Date End Date Taking? Authorizing Provider  aspirin 81 MG tablet Take 1 tablet (81 mg total) by mouth daily. 07/18/16  Yes Coralyn Helling, MD  furosemide (LASIX) 20 MG tablet Take 1 tablet (20 mg total) by mouth daily. 07/18/16  Yes Coralyn Helling, MD    Physical Exam: Vitals:   07/21/16 0415 07/21/16 0600 07/21/16 0638 07/21/16 0715  BP: (!) 142/99 (!) 133/95 (!) 160/110   Pulse:  87  93  Resp: (!) 28 20  (!) 26  Temp:      TempSrc:      SpO2:  94%  97%  Weight:         General:  Appears calm and comfortable, in no acute distress Eyes:  PERRL, EOMI, normal lids, iris ENT:  grossly normal hearing, lips & tongue, mucous membranes of his mouth are moist and pink Neck:  no LAD, masses or thyromegaly Cardiovascular:  RRR, no m/r/g. 2+ LE edema up to thighs Respiratory:  Normal respiratory effort. Rest sounds slightly distant with fair air movement. Very fine crackles bilateral bases otherwise no wheezes no rhonchi Abdomen:  Obese quite distended somewhat firm positive bowel sounds throughout mild tenderness throughout with palpation Skin:  no rash or induration seen on limited exam Musculoskeletal:  grossly normal tone BUE/BLE, good ROM, no bony abnormality Psychiatric:  grossly normal mood and affect, speech fluent and appropriate, AOx3 Neurologic:  CN 2-12 grossly intact, moves all extremities in coordinated fashion, sensation intact. Speech clear facial symmetry  Labs on Admission: I have personally reviewed following labs and imaging studies  CBC:  Recent Labs Lab 07/21/16 0020  WBC 9.4  HGB 11.7*  HCT 39.3  MCV 73.3*  PLT 317   Basic Metabolic Panel:  Recent Labs Lab 07/21/16 0020  NA 136  K 4.0  CL 100*  CO2 23  GLUCOSE 95  BUN 13  CREATININE 1.47*  CALCIUM 8.7*   GFR: Estimated Creatinine Clearance: 78.3 mL/min (A) (by C-G formula based on SCr of  1.47 mg/dL (H)). Liver Function Tests: No results for input(s): AST, ALT, ALKPHOS, BILITOT, PROT, ALBUMIN in the last 168 hours. No results for input(s): LIPASE, AMYLASE in the last 168 hours. No results for input(s): AMMONIA in the last 168 hours. Coagulation Profile: No results for input(s): INR, PROTIME in the last 168 hours. Cardiac Enzymes: No results for input(s): CKTOTAL, CKMB, CKMBINDEX, TROPONINI in the last 168 hours. BNP (last 3 results) No results for input(s): PROBNP in the last 8760 hours. HbA1C: No results for input(s): HGBA1C in the last 72 hours. CBG: No results for input(s): GLUCAP in the last 168 hours. Lipid Profile: No results for input(s): CHOL, HDL, LDLCALC, TRIG, CHOLHDL, LDLDIRECT in the last 72 hours. Thyroid Function Tests: No results for input(s): TSH, T4TOTAL, FREET4, T3FREE, THYROIDAB in the last 72 hours. Anemia Panel: No results for input(s): VITAMINB12, FOLATE, FERRITIN, TIBC, IRON, RETICCTPCT  in the last 72 hours. Urine analysis:    Component Value Date/Time   COLORURINE YELLOW 04/07/2013 2211   APPEARANCEUR CLEAR 04/07/2013 2211   LABSPEC 1.036 (H) 04/07/2013 2211   PHURINE 5.5 04/07/2013 2211   GLUCOSEU NEGATIVE 04/07/2013 2211   HGBUR NEGATIVE 04/07/2013 2211   BILIRUBINUR NEGATIVE 04/07/2013 2211   KETONESUR NEGATIVE 04/07/2013 2211   PROTEINUR NEGATIVE 04/07/2013 2211   UROBILINOGEN 0.2 04/07/2013 2211   NITRITE NEGATIVE 04/07/2013 2211   LEUKOCYTESUR NEGATIVE 04/07/2013 2211    Creatinine Clearance: Estimated Creatinine Clearance: 78.3 mL/min (A) (by C-G formula based on SCr of 1.47 mg/dL (H)).  Sepsis Labs: @LABRCNTIP (procalcitonin:4,lacticidven:4) )No results found for this or any previous visit (from the past 240 hour(s)).   Radiological Exams on Admission: Dg Chest 2 View  Result Date: 07/21/2016 CLINICAL DATA:  56 year old male with CHF. EXAM: CHEST  2 VIEW COMPARISON:  Chest radiograph dated 07/02/2016 FINDINGS: There is  stable moderate cardiomegaly. Mild prominence of the central vasculature. There is opacification of the right lung base likely combination of a small right pleural effusion with associated atelectasis/ infiltrate. The left lung is clear. There is no pneumothorax. Median sternotomy wires and CABG vascular clips noted. No acute osseous pathology. IMPRESSION: 1. Small right pleural effusion with associated right lung base subsegmental atelectasis versus infiltrate. Clinical correlation is recommended. 2. Stable moderate cardiomegaly. Probable mild vascular congestion. No pulmonary edema. Electronically Signed   By: Elgie Collard M.D.   On: 07/21/2016 01:51    EKG: Independently reviewed. Normal sinus rhythm Low voltage QRS Inferior infarct ,  Assessment/Plan Principal Problem:   Acute respiratory failure (HCC) Active Problems:   Acid reflux   Essential hypertension, benign   Obesity, unspecified   Coronary artery disease   Hypertensive emergency   Acute on chronic heart failure (HCC)   Acute kidney injury (HCC)   #1. Acute respiratory failure with hypoxia likely related to acute on chronic systolic heart failure in the setting of uncontrolled blood pressure. Oxygen saturation level drops 88% with ambulation. Chest x-ray reveals small right pleural effusion with associated right lung base subsegmental atelectasis versus infiltrate moderate cardiomegaly mild vascular congestion no pulmonary edema. He is afebrile no leukocytosis nontoxic appearing. Seen by pulmonology 2 days ago and started on 20 mg of Lasix. Prior to this, patient has been noncompliant with medications for quite a long time due to finances. -Admit to telemetry -Continue oxygen supplementation -Continue IV Lasix -Improved blood pressure control -Monitor oxygen saturation level -Wean oxygen as able  #2. Hypertensive  emergency. 2 years ago after CABG patient discharge medications include lisinopril and metoprolol. He's been  noncompliant with these medications for a year. In the emergency department he is provided with 1 inch of Nitropaste IV Lasix and lisinopril. Slight improvement on admission -Continue IV Lasix -Continue lisinopril -When necessary hydralazine -Monitor  #3. Acute on chronic systolic heart failure. Chart review indicates echo 2015 reveals an ejection fraction 45%. Patient quite edematous. BNP 30.6. Patient has appointment with Dr. Chales Abrahams next week. Has been prescribed lisinopril and metoprolol in past -Continue IV Lasix -monitor intake and output -Obtain daily weights -Cycle troponin -Obtain a 2-D echo -Cardiology consult  #4. Acute kidney injury. Likely related to above. Creatinine 1.4 on admission. -Hold nephrotoxins as able -Monitor urine output -If no improvement consider renal ultrasound  #5. CAD status post NSTEMI and CABG 2015. Denies chest pain. EKG NSR. Initial troponin negative. Pulmonology started him on aspirin 3 days ago -Cycle troponins -Continue aspirin -Obtain  a lipid panel -resume statin  #6. Obesity.weight 287 lbs. -nutritional consult    DVT prophylaxis: lovenox Code Status: full  Family Communication: none present  Disposition Plan: home  Consults called:  nishan Admission status: inpatient   Gwenyth Bender MD [facility]  If 7PM-7AM, please contact night-coverage [email or web address] Password [facility]  07/21/2016, 7:40 AM

## 2016-07-21 NOTE — ED Provider Notes (Signed)
[facility]-EMERGENCY DEPT Provider Note   CSN: [id number] Arrival date & time: 07/21/16  0010   By signing my name below, I, Clovis Pu, attest that this documentation has been prepared under the direction and in the presence of Zadie Rhine, MD  Electronically Signed: Clovis Pu, ED Scribe. 07/21/16. 3:18 AM.   History   Chief Complaint Chief Complaint  Patient presents with  . Congestive Heart Failure    HPI Comments:  Joshua [name] is a 56 y.o. male, with a PMHx of CHF, CAD, HTN, NSTEMI and PSHx of CABG, who presents to the Emergency Department complaining of acute onset, gradually improving abdominal pain x 1 week. Pt visited his PCP for his symptoms at that time, was referred to a pulmonologist.  He was seen by a pulmonologist and was started on lasix on 07/18/2016. He also reports leg swelling x 1 week. Pt has taken his lasix without relief. Pt denies chest pain, SOB or any other associated symptoms. Pt has an appointment to see a cardiologist on 07/23/2016. No other complaints noted at this time.   The history is provided by the patient. No language interpreter was used.  Abdominal Pain   This is a new problem. The current episode started more than 2 days ago. The problem has been gradually improving. The pain is associated with an unknown factor. The pain is moderate. Nothing aggravates the symptoms. Nothing relieves the symptoms.    Past Medical History:  Diagnosis Date  . Acid reflux   . Arthritis    SHOULDERS  . CHF (congestive heart failure) (HCC)   . Coronary artery disease 04/07/2013   cath by Dr. Lady Gary at [facility]  . Essential hypertension, benign 04/06/2013  . Hypertension   . Kidney stone   . Non-STEMI (non-ST elevated myocardial infarction) (HCC) 04/06/2013  . NSTEMI (non-ST elevated myocardial infarction) (HCC)   . Obesity, unspecified 04/07/2013  . 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 Acid reflux 04/07/2013  . Obesity, unspecified 04/07/2013  . Coronary artery disease 04/07/2013  . Kidney stone   . Non-STEMI (non-ST elevated myocardial infarction) (HCC) 04/06/2013  . Essential hypertension, benign 04/06/2013    Past Surgical History:  Procedure Laterality Date  . CORONARY ARTERY BYPASS GRAFT N/A 04/09/2013   Procedure: CORONARY ARTERY BYPASS GRAFTING (CABG);  Surgeon: Purcell Nails, MD;  Location: [facility] OR;  Service: Open Heart Surgery;  Laterality: N/A;  CABG x four,  using left internal mammary artery and right and left leg greater saphenous vein harvested endoscopically  . INTRAOPERATIVE TRANSESOPHAGEAL ECHOCARDIOGRAM N/A 04/09/2013   Procedure: INTRAOPERATIVE TRANSESOPHAGEAL ECHOCARDIOGRAM;  Surgeon: Purcell Nails, MD;  Location: [facility] OR;  Service: Open Heart Surgery;  Laterality: N/A;    Home Medications    Prior to Admission medications   Medication Sig Start Date End Date Taking? Authorizing Provider  aspirin 81 MG tablet Take 1 tablet (81 mg total) by mouth daily. 07/18/16   Coralyn Helling, MD  furosemide (LASIX) 20 MG tablet Take 1 tablet (20 mg total) by mouth daily. 07/18/16   Coralyn Helling, MD    Family History Family History  Problem Relation Age of Onset  . Cancer Mother   . Coronary artery disease Father   . Hypertension Father   .  Heart attack Paternal Uncle     Social History Social History  Substance Use Topics  . Smoking status: Never Smoker  . Smokeless tobacco: Never Used     Comment: iI  DIPPED TOBACCO AT ONE TOME"  . Alcohol use No     Allergies   Patient has no known allergies.   Review of Systems Review of Systems  Respiratory: Negative for shortness of breath.   Cardiovascular: Positive for leg swelling. Negative for chest pain.  Gastrointestinal: Positive for abdominal distention and abdominal pain.  All other systems reviewed and are  negative.  Physical Exam Updated Vital Signs BP (!) 150/110 (BP Location: Right Arm) Comment: Dr. Bebe Shaggy aware  Pulse 99   Temp 98.1 F (36.7 C) (Oral)   Resp 18   SpO2 98%   Physical Exam CONSTITUTIONAL: Well developed/well nourished HEAD: Normocephalic/atraumatic EYES: EOMI/PERRL ENMT: Mucous membranes moist NECK: supple no meningeal signs. No JVD SPINE/BACK:entire spine nontender CV: S1/S2 noted, no murmurs/rubs/gallops noted LUNGS: Lungs are clear to auscultation bilaterally, no apparent distress ABDOMEN: soft, nontender, no rebound or guarding, bowel sounds noted throughout abdomen, distended, anasarca noted.  GU:no cva tenderness NEURO: Pt is awake/alert/appropriate, moves all extremitiesx4.  No facial droop.   EXTREMITIES: pulses normal/equal, full ROM. Pitting edema to bilateral lower extremities.  SKIN: warm, color normal PSYCH: no abnormalities of mood noted, alert and oriented to situation   ED Treatments / Results  DIAGNOSTIC STUDIES:  Oxygen Saturation is 98% on RA, normal by my interpretation.    COORDINATION OF CARE:  3:09 AM Discussed treatment plan with pt at bedside and pt agreed to plan.  Labs (all labs ordered are listed, but only abnormal results are displayed) Labs Reviewed  BASIC METABOLIC PANEL - Abnormal; Notable for the following:       Result Value   Chloride 100 (*)    Creatinine, Ser 1.47 (*)    Calcium 8.7 (*)    GFR calc non Af Amer 52 (*)    GFR calc Af Amer 60 (*)    All other components within normal limits  CBC - Abnormal; Notable for the following:    Hemoglobin 11.7 (*)    MCV 73.3 (*)    MCH 21.8 (*)    MCHC 29.8 (*)    RDW 18.0 (*)    All other components within normal limits  BRAIN NATRIURETIC PEPTIDE  I-STAT TROPOININ, ED    EKG  EKG Interpretation  Date/Time:  Sunday July 21 2016 00:26:17 EDT Ventricular Rate:  95 PR Interval:  158 QRS Duration: 98 QT Interval:  376 QTC Calculation: 472 R Axis:   0 Text  Interpretation:  Normal sinus rhythm Low voltage QRS Inferior infarct , age undetermined Abnormal ECG Confirmed by Bebe Shaggy  MD, Jurrell Royster ([id number]) on 07/21/2016 2:58:07 AM       Radiology Dg Chest 2 View  Result Date: 07/21/2016 CLINICAL DATA:  56 year old male with CHF. EXAM: CHEST  2 VIEW COMPARISON:  Chest radiograph dated 07/02/2016 FINDINGS: There is stable moderate cardiomegaly. Mild prominence of the central vasculature. There is opacification of the right lung base likely combination of a small right pleural effusion with associated atelectasis/ infiltrate. The left lung is clear. There is no pneumothorax. Median sternotomy wires and CABG vascular clips noted. No acute osseous pathology. IMPRESSION: 1. Small right pleural effusion with associated right lung base subsegmental atelectasis versus infiltrate. Clinical correlation is recommended. 2. Stable moderate cardiomegaly. Probable mild vascular congestion. No pulmonary edema. Electronically Signed  By: Elgie Collard M.D.   On: 07/21/2016 01:51    Procedures Procedures (including critical care time)  Medications Ordered in ED    Initial Impression / Assessment and Plan / ED Course  I have reviewed the triage vital signs and the nursing notes.  Pertinent labs & imaging results that were available during my care of the patient were reviewed by me and considered in my medical decision making (see chart for details).     Pt monitored for several hours He had significant urine output (>2liters) However on ambulation, he became hypoxic He is still persistently hypertensive Will admit for BP control, continued diuresis and also will need echocardiogram  Pt agreeable with plan D/w triad team for admission  Final Clinical Impressions(s) / ED Diagnoses   Final diagnoses:  Acute on chronic systolic congestive heart failure (HCC)    New Prescriptions New Prescriptions   No medications on file  I personally performed the  services described in this documentation, which was scribed in my presence. The recorded information has been reviewed and is accurate.        Zadie Rhine, MD 07/21/16 [phone]

## 2016-07-21 NOTE — Progress Notes (Signed)
  Echocardiogram 2D Echocardiogram has been performed.  Joshua [name] 07/21/2016, 3:36 PM

## 2016-07-21 NOTE — ED Triage Notes (Signed)
Pt states he was seen by MD and dx with CHF on Thursday and given lasix; pt is awaiting to to see cardiologist on Tuesday but has experienced increased abdominal and leg swelling; pt states he can not make it til Tuesday; pt states he increased lasix in take but only able to urinate a little out; Pt denies pain on arrival. Pt a&ox 4 on arrival.

## 2016-07-21 NOTE — Discharge Instructions (Signed)
As we discussed you can increase your Lasix to two tablets in the morning and two in the evening

## 2016-07-21 NOTE — ED Notes (Signed)
ED Provider at bedside. 

## 2016-07-21 NOTE — ED Notes (Signed)
Pt. Transported to unit via wheelchair while on telemetry monitor. Pt. Brought with him a cell phone, wallet, pair of glasses, t-shirt, and a few papers. Belongings placed in belongings bag.

## 2016-07-22 DIAGNOSIS — J9601 Acute respiratory failure with hypoxia: Secondary | ICD-10-CM

## 2016-07-22 LAB — HEPATIC FUNCTION PANEL
ALBUMIN: 2.7 g/dL — AB (ref 3.5–5.0)
ALK PHOS: 77 U/L (ref 38–126)
ALT: 17 U/L (ref 17–63)
AST: 21 U/L (ref 15–41)
Bilirubin, Direct: 0.2 mg/dL (ref 0.1–0.5)
Indirect Bilirubin: 0.8 mg/dL (ref 0.3–0.9)
TOTAL PROTEIN: 5 g/dL — AB (ref 6.5–8.1)
Total Bilirubin: 1 mg/dL (ref 0.3–1.2)

## 2016-07-22 LAB — BASIC METABOLIC PANEL
Anion gap: 8 (ref 5–15)
BUN: 15 mg/dL (ref 6–20)
CHLORIDE: 99 mmol/L — AB (ref 101–111)
CO2: 30 mmol/L (ref 22–32)
CREATININE: 1.42 mg/dL — AB (ref 0.61–1.24)
Calcium: 8.4 mg/dL — ABNORMAL LOW (ref 8.9–10.3)
GFR, EST NON AFRICAN AMERICAN: 54 mL/min — AB (ref >60)
Glucose, Bld: 106 mg/dL — ABNORMAL HIGH (ref 65–99)
POTASSIUM: 3.4 mmol/L — AB (ref 3.5–5.1)
SODIUM: 137 mmol/L (ref 135–145)

## 2016-07-22 MED ORDER — FUROSEMIDE 10 MG/ML IJ SOLN
40.0000 mg | Freq: Two times a day (BID) | INTRAMUSCULAR | Status: DC
  Administered 2016-07-22 – 2016-07-27 (×10): 40 mg via INTRAVENOUS
  Filled 2016-07-22 (×10): qty 4

## 2016-07-22 NOTE — Progress Notes (Signed)
CM following for DCP; awaiting on PT evals for disposition needs; B Cyara Devoto RN,MHA,BSN [phone] 

## 2016-07-22 NOTE — Progress Notes (Signed)
PROGRESS NOTE    Joshua [name]  ZOX:[id number] DOB: [birth year] DOA: 07/21/2016 PCP: No PCP Per Patient  Outpatient Specialists:     Brief Narrative:  56 htn  gerd Body mass index is 36.78 kg/m.  NSTEMI 03/2013 with 3vd-CABG x 4-at the time EF 45%-non compliant with meds Referred by GI to Pulmonology for sob periph edema Started on lasix 20 daily on 4/26  Came to ED with abd pain, sob doe o2 sat 88% given IV lasix and lisinopril  On admission bun /creat 13/1.4, BNP 30 troponin marginally elevated Hb 11.7 cxr atelectasis vs infiltrate  Assessment & Plan:   Principal Problem:   Acute respiratory failure (HCC) Active Problems:   Acid reflux   Essential hypertension, benign   Obesity, unspecified   Coronary artery disease   Hypertensive emergency   Acute on chronic heart failure (HCC)   Acute kidney injury (HCC)   Acute hypoxic rep failure-?2/2 to HF decompnesated aeshf?-bnp only 30 EKG shows scant evidence lvh Continue diuresis with Lasix 40 bid Consider conersion to po 1-2 days Claims dry wght ~ 270--wght 2015 at OV was 241 -4 liters so far-still has anasarca AKI Patient creatinine up from baline ~ 1.0-->1/4 monitor whle diuresing Change lasix 20 q6-->40 q12 iv Cont nitro pasrte  CAD-CABG x 4 03/2013 Needs ACe-watch kindey fucntion Hold BB for now as diuresing Cont asa 81 daily  Body mass index is 36.78 kg/m. OP follow up  Impaired glucose tolerance Monitor with am labs-cover with insulin if cbg ?180 and get A1c  Hyperbilirubinemia-rpt labs today ?NASH Labs do not show any increase in lft--hold further work up for now  DVT prophylaxis: (Lovenox/Heparin/SCD's/anticoagulated/None (if comfort care) Code Status: (Full/Partial - specify details) Family Communication: (Specify name, relationship & date discussed. NO "discussed with patient") Disposition Plan: (specify when and where you expect patient to be discharged)   Consultants:   Cardiology  pending  Procedures:   Echo pending  Antimicrobials:   none    Subjective:  Well feels much better no sob no cp Eating and drinking   Objective: Vitals:   07/21/16 1213 07/21/16 2043 07/21/16 2358 07/22/16 0503  BP: 140/89 (!) 101/57 123/87 131/86  Pulse: 86 81 86 88  Resp: 20 20 20 20   Temp: 97.9 F (36.6 C)  99.3 F (37.4 C) 98.4 F (36.9 C)  TempSrc: Oral  Oral Oral  SpO2: 96% 94% 96% 97%  Weight:    123 kg (271 lb 3.2 oz)  Height:        Intake/Output Summary (Last 24 hours) at 07/22/16 0714 Last data filed at 07/22/16 0600  Gross per 24 hour  Intake              720 ml  Output             4925 ml  Net            -4205 ml   Filed Weights   07/21/16 0328 07/21/16 0808 07/22/16 0503  Weight: 130.4 kg (287 lb 7.7 oz) 125.5 kg (276 lb 10.8 oz) 123 kg (271 lb 3.2 oz)    Examination:  eomi ncat s1 s2 no m/r/g--tele sinus/sinuc tach 100 No jvd [hard to assess] no bruit cta b  Le and abd swelling diffusely Neuro intact moving 4 limbs equally    Data Reviewed: I have personally reviewed following labs and imaging studies  CBC:  Recent Labs Lab 07/21/16 0020  WBC 9.4  HGB 11.7*  HCT 39.3  MCV 73.3*  PLT 317   Basic Metabolic Panel:  Recent Labs Lab 07/21/16 0020 07/21/16 0727 07/22/16 0309  NA 136  --  137  K 4.0  --  3.4*  CL 100*  --  99*  CO2 23  --  30  GLUCOSE 95  --  106*  BUN 13  --  15  CREATININE 1.47*  --  1.42*  CALCIUM 8.7*  --  8.4*  MG  --  1.5*  --    GFR: Estimated Creatinine Clearance: 78.7 mL/min (A) (by C-G formula based on SCr of 1.42 mg/dL (H)). Liver Function Tests:  Recent Labs Lab 07/21/16 0903  AST 26  ALT 19  ALKPHOS 90  BILITOT 1.7*  PROT 5.7*  ALBUMIN 3.1*   No results for input(s): LIPASE, AMYLASE in the last 168 hours. No results for input(s): AMMONIA in the last 168 hours. Coagulation Profile: No results for input(s): INR, PROTIME in the last 168 hours. Cardiac Enzymes:  Recent Labs Lab  07/21/16 0727 07/21/16 1249 07/21/16 1843  TROPONINI 0.03* 0.04* 0.03*   BNP (last 3 results) No results for input(s): PROBNP in the last 8760 hours. HbA1C: No results for input(s): HGBA1C in the last 72 hours. CBG: No results for input(s): GLUCAP in the last 168 hours. Lipid Profile:  Recent Labs  07/21/16 0903  CHOL 118  HDL 20*  LDLCALC 81  TRIG 83  CHOLHDL 5.9   Thyroid Function Tests: No results for input(s): TSH, T4TOTAL, FREET4, T3FREE, THYROIDAB in the last 72 hours. Anemia Panel: No results for input(s): VITAMINB12, FOLATE, FERRITIN, TIBC, IRON, RETICCTPCT in the last 72 hours. Urine analysis:    Component Value Date/Time   COLORURINE YELLOW 04/07/2013 2211   APPEARANCEUR CLEAR 04/07/2013 2211   LABSPEC 1.036 (H) 04/07/2013 2211   PHURINE 5.5 04/07/2013 2211   GLUCOSEU NEGATIVE 04/07/2013 2211   HGBUR NEGATIVE 04/07/2013 2211   BILIRUBINUR NEGATIVE 04/07/2013 2211   KETONESUR NEGATIVE 04/07/2013 2211   PROTEINUR NEGATIVE 04/07/2013 2211   UROBILINOGEN 0.2 04/07/2013 2211   NITRITE NEGATIVE 04/07/2013 2211   LEUKOCYTESUR NEGATIVE 04/07/2013 2211   Sepsis Labs: @LABRCNTIP (procalcitonin:4,lacticidven:4)  )No results found for this or any previous visit (from the past 240 hour(s)).       Radiology Studies: Dg Chest 2 View  Result Date: 07/21/2016 CLINICAL DATA:  56 year old male with CHF. EXAM: CHEST  2 VIEW COMPARISON:  Chest radiograph dated 07/02/2016 FINDINGS: There is stable moderate cardiomegaly. Mild prominence of the central vasculature. There is opacification of the right lung base likely combination of a small right pleural effusion with associated atelectasis/ infiltrate. The left lung is clear. There is no pneumothorax. Median sternotomy wires and CABG vascular clips noted. No acute osseous pathology. IMPRESSION: 1. Small right pleural effusion with associated right lung base subsegmental atelectasis versus infiltrate. Clinical correlation is  recommended. 2. Stable moderate cardiomegaly. Probable mild vascular congestion. No pulmonary edema. Electronically Signed   By: Elgie Collard M.D.   On: 07/21/2016 01:51        Scheduled Meds: . aspirin EC  81 mg Oral Daily  . enoxaparin (LOVENOX) injection  40 mg Subcutaneous Q24H  . furosemide  20 mg Intravenous Q6H  . lisinopril  10 mg Oral Daily  . simvastatin  20 mg Oral q1800  . sodium chloride flush  3 mL Intravenous Q12H   Continuous Infusions: . sodium chloride       LOS: 1 day    Time spent: 30  Pleas Koch, MD [phone]   If 7PM-7AM, please contact night-coverage [email or web address] Password [email or web address] 07/22/2016, 7:14 AM

## 2016-07-23 ENCOUNTER — Ambulatory Visit: Payer: BC Managed Care – PPO | Admitting: Cardiology

## 2016-07-23 DIAGNOSIS — I2510 Atherosclerotic heart disease of native coronary artery without angina pectoris: Secondary | ICD-10-CM

## 2016-07-23 DIAGNOSIS — I5021 Acute systolic (congestive) heart failure: Secondary | ICD-10-CM

## 2016-07-23 LAB — BASIC METABOLIC PANEL
Anion gap: 9 (ref 5–15)
BUN: 16 mg/dL (ref 6–20)
CHLORIDE: 97 mmol/L — AB (ref 101–111)
CO2: 29 mmol/L (ref 22–32)
Calcium: 8.2 mg/dL — ABNORMAL LOW (ref 8.9–10.3)
Creatinine, Ser: 1.36 mg/dL — ABNORMAL HIGH (ref 0.61–1.24)
GFR calc Af Amer: >60 mL/min (ref >60)
GFR calc non Af Amer: 57 mL/min — ABNORMAL LOW (ref >60)
Glucose, Bld: 106 mg/dL — ABNORMAL HIGH (ref 65–99)
POTASSIUM: 3.4 mmol/L — AB (ref 3.5–5.1)
SODIUM: 135 mmol/L (ref 135–145)

## 2016-07-23 MED ORDER — LOSARTAN POTASSIUM 25 MG PO TABS
25.0000 mg | ORAL_TABLET | Freq: Two times a day (BID) | ORAL | Status: DC
  Administered 2016-07-23 – 2016-07-24 (×3): 25 mg via ORAL
  Filled 2016-07-23 (×3): qty 1

## 2016-07-23 MED ORDER — ENOXAPARIN SODIUM 60 MG/0.6ML ~~LOC~~ SOLN
60.0000 mg | SUBCUTANEOUS | Status: DC
  Administered 2016-07-23 – 2016-07-25 (×3): 60 mg via SUBCUTANEOUS
  Filled 2016-07-23 (×3): qty 0.6

## 2016-07-23 MED ORDER — POTASSIUM CHLORIDE CRYS ER 20 MEQ PO TBCR
20.0000 meq | EXTENDED_RELEASE_TABLET | Freq: Two times a day (BID) | ORAL | Status: DC
  Administered 2016-07-24 – 2016-07-27 (×7): 20 meq via ORAL
  Filled 2016-07-23 (×7): qty 1

## 2016-07-23 MED ORDER — ATORVASTATIN CALCIUM 40 MG PO TABS
40.0000 mg | ORAL_TABLET | Freq: Every day | ORAL | Status: DC
  Administered 2016-07-23 – 2016-07-26 (×4): 40 mg via ORAL
  Filled 2016-07-23 (×4): qty 1

## 2016-07-23 MED ORDER — POTASSIUM CHLORIDE CRYS ER 20 MEQ PO TBCR
40.0000 meq | EXTENDED_RELEASE_TABLET | Freq: Once | ORAL | Status: AC
  Administered 2016-07-23: 40 meq via ORAL
  Filled 2016-07-23: qty 2

## 2016-07-23 NOTE — Progress Notes (Signed)
PT Cancellation Note  Patient Details Name: Joshua [name] MRN: [id number] DOB: [birth year]   Cancelled Treatment:    Reason Eval/Treat Not Completed: PT screened, no needs identified, will sign off.  Walking with no assist and no AD on the hall, no SOB and nursing reports he is diuresing with 30# wgt loss since admission.  No further PT needs identified.   Ivar Drape 07/23/2016, 11:58 AM   Samul Dada, PT MS Acute Rehab Dept. Number: [facility] [id number] and [facility] [phone]

## 2016-07-23 NOTE — Progress Notes (Signed)
Patient requested to shower. Per Dr Mahala Menghini pt may not shower.

## 2016-07-23 NOTE — Consult Note (Signed)
Advanced Heart Failure Team Consult Note   Primary Physician: No PCP per patient  Primary Cardiologist:  New, has seen Dr. Lady Gary in the past.   Reason for Consultation: Acute on chronic combined systolic and diastolic CHF  HPI:    Joshua [name] is seen today for evaluation of acute on chronic combined systolic and diastolic CHF at the request of Dr. Mahala Menghini.   Joshua [name] is a 56 year old male with a past medical history of HTN and CAD s/p CABG in Jan. 2015. He has had no follow up since, has only been taking an aspirin daily.   About a month ago he noticed increasing abdominal discomfort, weight gain, early satiety, and PND. He went to an urgent care and was eventually referred to pulmonology and was given 20mg  oral lasix, but his symptoms persisted. His SOB and abdominal pain worsened over the past week to the point that he was making himself throw up to relieve abdominal discomfort. He eats a high salt diet, lots of canned foods including [name]. Also drinking more than 2L, drinks Gatorade often.   Upon arrival to the ED on 07/21/16, o2 saturations in the low 90's. Creatinine 1.47, troponin 0.03. BNP 30. He has gotten 2 days of IV diuresis, weight down 10 pounds (first weight charted is likely inaccurate), -11L so far. Echo shows an EF of 20-25%, RV moderately dilated. His last Echo was in 2015, prior to CABG and EF was 55-60%.    Review of Systems: [y] = yes, [ ]  = no   General: Weight gain Cove.Etienne ]; Weight loss [ ] ; Anorexia [ ] ; Fatigue Cove.Etienne ]; Fever [ ] ; Chills [ ] ; Weakness [ ]   Cardiac: Chest pain/pressure [ ] ; Resting SOB Cove.Etienne ]; Exertional SOB [ y]; Orthopnea [ y]; Pedal Edema Cove.Etienne ]; Palpitations [ ] ; Syncope [ ] ; Presyncope [ ] ; Paroxysmal nocturnal dyspnea[ y]  Pulmonary: Cough [ y]; Wheezing[ ] ; Hemoptysis[ ] ; Sputum [ ] ; Snoring [ ]   GI: Vomiting[y ]; Dysphagia[ ] ; Melena[ ] ; Hematochezia [ ] ; Heartburn[ ] ; Abdominal pain [ ] ; Constipation [ ] ; Diarrhea [ ] ; BRBPR [ ]   GU:  Hematuria[ ] ; Dysuria [ ] ; Nocturia[ ]   Vascular: Pain in legs with walking [ ] ; Pain in feet with lying flat [ ] ; Non-healing sores [ ] ; Stroke [ ] ; TIA [ ] ; Slurred speech [ ] ;  Neuro: Headaches[ ] ; Vertigo[ ] ; Seizures[ ] ; Paresthesias[ ] ;Blurred vision [ ] ; Diplopia [ ] ; Vision changes [ ]   Ortho/Skin: Arthritis Cove.Etienne ]; Joint pain [ ] ; Muscle pain [ ] ; Joint swelling [ ] ; Back Pain [ ] ; Rash [ ]   Psych: Depression[ ] ; Anxiety[ ]   Heme: Bleeding problems [ ] ; Clotting disorders [ ] ; Anemia [ ]   Endocrine: Diabetes [ ] ; Thyroid dysfunction[ ]   Home Medications Prior to Admission medications   Medication Sig Start Date End Date Taking? Authorizing Provider  aspirin 81 MG tablet Take 1 tablet (81 mg total) by mouth daily. 07/18/16  Yes Coralyn Helling, MD  furosemide (LASIX) 20 MG tablet Take 1 tablet (20 mg total) by mouth daily. 07/18/16  Yes Coralyn Helling, MD    Past Medical History: Past Medical History:  Diagnosis Date  . Acid reflux   . Anasarca   . Arthritis    SHOULDERS  . CHF (congestive heart failure) (HCC)   . Coronary artery disease 04/07/2013   cath by Dr. Lady Gary at [facility]  . Essential hypertension, benign 04/06/2013  . Hypertension   .  Kidney stone   . Non-STEMI (non-ST elevated myocardial infarction) (HCC) 04/06/2013  . NSTEMI (non-ST elevated myocardial infarction) (HCC)   . Obesity, unspecified 04/07/2013  . 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 CORONARY ARTERY BYPASS GRAFT N/A 04/09/2013   Procedure: CORONARY ARTERY BYPASS GRAFTING (CABG);  Surgeon: Purcell Nails, MD;  Location: [facility] OR;  Service: Open Heart Surgery;  Laterality: N/A;  CABG x four,  using left internal mammary artery and right and left leg greater saphenous vein harvested endoscopically  . INTRAOPERATIVE TRANSESOPHAGEAL ECHOCARDIOGRAM N/A 04/09/2013    Procedure: INTRAOPERATIVE TRANSESOPHAGEAL ECHOCARDIOGRAM;  Surgeon: Purcell Nails, MD;  Location: [facility] OR;  Service: Open Heart Surgery;  Laterality: N/A;    Family History: Family History  Problem Relation Age of Onset  . Cancer Mother   . Coronary artery disease Father   . Hypertension Father   . Heart attack Paternal Uncle     Social History: Social History   Social History  . Marital status: Single    Spouse name: N/A  . Number of children: N/A  . Years of education: N/A   Occupational History  . house remodeler      currently out of work  .  [employer]   Social History Main Topics  . Smoking status: Never Smoker  . Smokeless tobacco: Never Used     Comment: iI  DIPPED TOBACCO AT ONE TOME"  . Alcohol use No  . Drug use: No  . Sexual activity: Not Asked   Other Topics Concern  . 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    Allergies:  No Known Allergies  Objective:    Vital Signs:   Temp:  [98.1 F (36.7 C)-99.7 F (37.6 C)] 99.7 F (37.6 C) (05/01 0658) Pulse Rate:  [84-88] 87 (05/01 0658) Resp:  [18] 18 (05/01 0658) BP: (97-132)/(57-94) 131/94 (05/01 0828) SpO2:  [93 %-98 %] 96 % (05/01 0658) Weight:  [266 lb 4.8 oz (120.8 kg)] 266 lb 4.8 oz (120.8 kg) (05/01 0640) Last BM Date: 07/22/16  Weight change: Filed Weights   07/21/16 0808 07/22/16 0503 07/23/16 0640  Weight: 276 lb 10.8 oz (125.5 kg) 271 lb 3.2 oz (123 kg) 266 lb 4.8 oz (120.8 kg)    Intake/Output:   Intake/Output Summary (Last 24 hours) at 07/23/16 1036 Last data filed at 07/23/16 0900  Gross per 24 hour  Intake              460 ml  Output             4525 ml  Net            -4065 ml     Physical Exam: General: Anxious male, NAD. Sitting up in chair.  HEENT: normal, atraumatic.  Neck: supple. JVP to jaw. Carotids 2+ bilat; no bruits. No lymphadenopathy or thyromegaly appreciated. Cor: PMI  nondisplaced. Regular rate & rhythm. No rubs, gallops or murmurs. Lungs: bilateral crackles in lower lobes, Normal effort. No wheezing.  Abdomen: soft, nontender, nondistended. No hepatosplenomegaly. No bruits or masses. Good bowel sounds. Extremities: no cyanosis, clubbing, rash. Warm. +3 pitting edema to thighs.  Neuro: alert & orientedx3, cranial nerves grossly intact. moves all 4 extremities  w/o difficulty. Affect pleasant  Telemetry: NSR, frequent PVC's.   Labs: Basic Metabolic Panel:  Recent Labs Lab 07/21/16 0020 07/21/16 0727 07/22/16 0309 07/23/16 0402  NA 136  --  137 135  K 4.0  --  3.4* 3.4*  CL 100*  --  99* 97*  CO2 23  --  30 29  GLUCOSE 95  --  106* 106*  BUN 13  --  15 16  CREATININE 1.47*  --  1.42* 1.36*  CALCIUM 8.7*  --  8.4* 8.2*  MG  --  1.5*  --   --     Liver Function Tests:  Recent Labs Lab 07/21/16 0903 07/22/16 0309  AST 26 21  ALT 19 17  ALKPHOS 90 77  BILITOT 1.7* 1.0  PROT 5.7* 5.0*  ALBUMIN 3.1* 2.7*    CBC:  Recent Labs Lab 07/21/16 0020  WBC 9.4  HGB 11.7*  HCT 39.3  MCV 73.3*  PLT 317    Cardiac Enzymes:  Recent Labs Lab 07/21/16 0727 07/21/16 1249 07/21/16 1843  TROPONINI 0.03* 0.04* 0.03*    BNP: BNP (last 3 results)  Recent Labs  07/21/16 0020  BNP 30.6    Other results: EKG: NSR, low voltage QRS. No acute ST/T wave changes.   Transthoracic Echocardiography 07/21/16 Study Conclusions  - Left ventricle: The cavity size was moderately dilated. Wall   thickness was increased in a pattern of mild LVH. Systolic   function was severely reduced. The estimated ejection fraction   was in the range of 20% to 25%. Diffuse hypokinesis. Doppler   parameters are consistent with both elevated ventricular   end-diastolic filling pressure and elevated left atrial filling   pressure. - Left atrium: The atrium was mildly dilated. - Right ventricle: The cavity size was moderately dilated. - Right atrium: The  atrium was mildly dilated. - Atrial septum: No defect or patent foramen ovale was identified.   Medications:     Current Medications: . aspirin EC  81 mg Oral Daily  . enoxaparin (LOVENOX) injection  40 mg Subcutaneous Q24H  . furosemide  40 mg Intravenous BID  . lisinopril  10 mg Oral Daily  . simvastatin  20 mg Oral q1800  . sodium chloride flush  3 mL Intravenous Q12H     Infusions: . sodium chloride        Assessment/Plan   1. Acute on chronic combined systolic and diastolic CHF: EF reduced to 20-25%, with history of CAD s/p CABG.  - markedly volume overloaded on exam, but has diuresed well so far.  - Says his usual weight is in the 260's, last office visit at [facility] weight was 241 pounds.  - Continue lasix 40mg  BID, has had a good response so far.  - Stop lisinopril. Start losartan 25mg  BID, in anticipation of starting Entresto.  - Will need to assess graft angiography, will need to get volume better managed before we schedule cath.  - Hold off on beta blocker for now. Will start Coreg once volume status improves.  2. CAD s/p CABG - His anginal pain prior to CABG was characterized by indigestion symptoms and left arm pain. He has not experienced any of these symptoms. Troponin mildly elevated in the setting of volume overload, however with drop in EF we will need to rule out an ischemic cause.  - Continue ASA.  - LDL 81. Will change simvastatin to atorvastatin 40mg  with history of CAD.  - Ordered A1c for risk stratification.  3.  Acute renal insufficiency: Creatinine normal in 2015, now elevated in the setting of volume overload.  - Follow with daily BMET 4. Questionable sleep apena - Needs a sleep study outpatient. Has thick neck girth, snores.  - RV moderately dilated.  5. Hypokalemia - Will order K.     Length of Stay: 2  Little Ishikawa, NP  07/23/2016, 10:36 AM Advanced Heart Failure Team  Pager [phone] M-F 7am-4pm.  Please contact [facility] for  night-coverage after hours (4p -7a ) and weekends on [email or web address]   Patient seen and examined with Suzzette Righter, NP. We discussed all aspects of the encounter. I agree with the assessment and plan as stated above.   Joshua [name] is a 56 y/o male as above with h/o CAD s/p CABG in 2015. EF normal at time. Has not had any f/u for several years and not taking meds. Presented with ADHF and massive volume overload. Now diuresing well. Echo reviewed personally and EF down to 20-25%. No clear ischemic symptoms. Suspect either CABG graft disease or HTN +/- OSA. Will continue to diurese and adjust HF meds. Plan R/L heart cath when diuresed. Likely Friday.   Arvilla Meres, MD  4:58 PM

## 2016-07-23 NOTE — Progress Notes (Signed)
PROGRESS NOTE    Joshua [name]  WUJ:[id number] DOB: 1960/11/16 DOA: 07/21/2016 PCP: No PCP Per Patient  Outpatient Specialists:     Brief Narrative:  56 htn  gerd Body mass index is 36.12 kg/m.  NSTEMI 03/2013 with 3vd-CABG x 4-at the time EF 45%-non compliant with meds Referred by GI to Pulmonology for sob periph edema Started on lasix 20 daily on 4/26  Came to ED with abd pain, sob doe o2 sat 88% given IV lasix and lisinopril  On admission bun /creat 13/1.4, BNP 30 troponin marginally elevated Hb 11.7 cxr atelectasis vs infiltrate  Assessment & Plan:   Principal Problem:   Acute respiratory failure (HCC) Active Problems:   Acid reflux   Essential hypertension, benign   Obesity, unspecified   Coronary artery disease   Hypertensive emergency   Acute on chronic heart failure (HCC)   Acute kidney injury (HCC)   Acute hypoxic rep failure-?2/2 to HF decompnesated aeshf?-bnp only 30 EKG shows scant evidence lvh Continue diuresis with Lasix 40 bid Consider conersion to po 1-2 days Claims dry wght ~ 270--wght 2015 at [facility] was 241---probably has a ways to go in terms of diuresis -8.7 liters so far-still has anasarca  AKI Patient creatinine up from baseline ~ 1.0-->1/4 monitor whle diuresing Change lasix 20 q6-->40 q12 iv Cont nitro paste  ?Ischemic cardiomyopathy Cardiology formally consulted as EF down to 20-25% -appreciate input in advance ? Need cath   CAD-CABG x 4 03/2013 Needs ACe-watch kindey fucntion Hold BB for now as diuresing Cont asa 81 daily  Body mass index is 36.12 kg/m. OP follow up  Impaired glucose tolerance Monitor with am labs-cover with insulin if cbg ?180 and get A1c  Hyperbilirubinemia-rpt labs today ?NASH Labs do not show any increase in lft--hold further work up for now  lovenox Full code Inpatient pending resuli8tion No family +  Consultants:   Cardiology pending  Procedures:   Echo pending  Antimicrobials:   none     Subjective:   Poor sleep overnight sob better No fever no chills   Objective: Vitals:   07/22/16 2233 07/23/16 0640 07/23/16 0658 07/23/16 0828  BP: (!) 97/57  130/87 (!) 131/94  Pulse: 88  87   Resp: 18  18   Temp: 98.8 F (37.1 C)  99.7 F (37.6 C)   TempSrc: Oral  Oral   SpO2: 93%  96%   Weight:  120.8 kg (266 lb 4.8 oz)    Height:        Intake/Output Summary (Last 24 hours) at 07/23/16 1029 Last data filed at 07/23/16 0900  Gross per 24 hour  Intake              460 ml  Output             4525 ml  Net            -4065 ml   Filed Weights   07/21/16 0808 07/22/16 0503 07/23/16 0640  Weight: 125.5 kg (276 lb 10.8 oz) 123 kg (271 lb 3.2 oz) 120.8 kg (266 lb 4.8 oz)    Examination:  eomi ncat s1 s2 no m/r/g--tele sinus/sinuc tach 100 No jvd [hard to assess] no bruit cta b  Le and abd swelling diffusely Neuro intact moving 4 limbs equally    Data Reviewed: I have personally reviewed following labs and imaging studies  CBC:  Recent Labs Lab 07/21/16 0020  WBC 9.4  HGB 11.7*  HCT 39.3  MCV 73.3*  PLT 317   Basic Metabolic Panel:  Recent Labs Lab 07/21/16 0020 07/21/16 0727 07/22/16 0309 07/23/16 0402  NA 136  --  137 135  K 4.0  --  3.4* 3.4*  CL 100*  --  99* 97*  CO2 23  --  30 29  GLUCOSE 95  --  106* 106*  BUN 13  --  15 16  CREATININE 1.47*  --  1.42* 1.36*  CALCIUM 8.7*  --  8.4* 8.2*  MG  --  1.5*  --   --    GFR: Estimated Creatinine Clearance: 81.4 mL/min (A) (by C-G formula based on SCr of 1.36 mg/dL (H)). Liver Function Tests:  Recent Labs Lab 07/21/16 0903 07/22/16 0309  AST 26 21  ALT 19 17  ALKPHOS 90 77  BILITOT 1.7* 1.0  PROT 5.7* 5.0*  ALBUMIN 3.1* 2.7*   No results for input(s): LIPASE, AMYLASE in the last 168 hours. No results for input(s): AMMONIA in the last 168 hours. Coagulation Profile: No results for input(s): INR, PROTIME in the last 168 hours. Cardiac Enzymes:  Recent Labs Lab 07/21/16 0727  07/21/16 1249 07/21/16 1843  TROPONINI 0.03* 0.04* 0.03*   BNP (last 3 results) No results for input(s): PROBNP in the last 8760 hours. HbA1C: No results for input(s): HGBA1C in the last 72 hours. CBG: No results for input(s): GLUCAP in the last 168 hours. Lipid Profile:  Recent Labs  07/21/16 0903  CHOL 118  HDL 20*  LDLCALC 81  TRIG 83  CHOLHDL 5.9   Thyroid Function Tests: No results for input(s): TSH, T4TOTAL, FREET4, T3FREE, THYROIDAB in the last 72 hours. Anemia Panel: No results for input(s): VITAMINB12, FOLATE, FERRITIN, TIBC, IRON, RETICCTPCT in the last 72 hours. Urine analysis:    Component Value Date/Time   COLORURINE YELLOW 04/07/2013 2211   APPEARANCEUR CLEAR 04/07/2013 2211   LABSPEC 1.036 (H) 04/07/2013 2211   PHURINE 5.5 04/07/2013 2211   GLUCOSEU NEGATIVE 04/07/2013 2211   HGBUR NEGATIVE 04/07/2013 2211   BILIRUBINUR NEGATIVE 04/07/2013 2211   KETONESUR NEGATIVE 04/07/2013 2211   PROTEINUR NEGATIVE 04/07/2013 2211   UROBILINOGEN 0.2 04/07/2013 2211   NITRITE NEGATIVE 04/07/2013 2211   LEUKOCYTESUR NEGATIVE 04/07/2013 2211   Sepsis Labs: @LABRCNTIP (procalcitonin:4,lacticidven:4)  )No results found for this or any previous visit (from the past 240 hour(s)).       Radiology Studies: No results found.      Scheduled Meds: . aspirin EC  81 mg Oral Daily  . enoxaparin (LOVENOX) injection  40 mg Subcutaneous Q24H  . furosemide  40 mg Intravenous BID  . lisinopril  10 mg Oral Daily  . simvastatin  20 mg Oral q1800  . sodium chloride flush  3 mL Intravenous Q12H   Continuous Infusions: . sodium chloride       LOS: 2 days    Time spent: 72    Pleas Koch, MD [facility] ([phone]   If 7PM-7AM, please contact night-coverage [email or web address] Password [email or web address] 07/23/2016, 10:29 AM

## 2016-07-24 ENCOUNTER — Encounter: Payer: Self-pay | Admitting: Cardiology

## 2016-07-24 DIAGNOSIS — E6609 Other obesity due to excess calories: Secondary | ICD-10-CM

## 2016-07-24 DIAGNOSIS — I5023 Acute on chronic systolic (congestive) heart failure: Secondary | ICD-10-CM

## 2016-07-24 LAB — BASIC METABOLIC PANEL
Anion gap: 8 (ref 5–15)
BUN: 15 mg/dL (ref 6–20)
CALCIUM: 8.4 mg/dL — AB (ref 8.9–10.3)
CO2: 29 mmol/L (ref 22–32)
Chloride: 100 mmol/L — ABNORMAL LOW (ref 101–111)
Creatinine, Ser: 1.26 mg/dL — ABNORMAL HIGH (ref 0.61–1.24)
Glucose, Bld: 96 mg/dL (ref 65–99)
Potassium: 3.7 mmol/L (ref 3.5–5.1)
SODIUM: 137 mmol/L (ref 135–145)

## 2016-07-24 LAB — MAGNESIUM: Magnesium: 2.2 mg/dL (ref 1.7–2.4)

## 2016-07-24 MED ORDER — SPIRONOLACTONE 25 MG PO TABS
12.5000 mg | ORAL_TABLET | Freq: Every day | ORAL | Status: DC
  Administered 2016-07-24 – 2016-07-27 (×4): 12.5 mg via ORAL
  Filled 2016-07-24 (×4): qty 1

## 2016-07-24 NOTE — Progress Notes (Signed)
PROGRESS NOTE        PATIENT DETAILS Name: Joshua [name] Age: 56 y.o. Sex: male Date of Birth: 02/27/1961 Admit Date: 07/21/2016 Admitting Physician Haydee Salter, MD PCP:No PCP Per Patient  Brief Narrative: Patient is a 56 y.o. male with a prior history of CAD status post CABG in 2015-lost to follow-up since then, admitted with acute on chronic systolic heart failure. Improving with diuresis, cardiology following. See below for further details  Subjective: Feels much better-still has swelling in his leg. Breathing much better.  No chest pain No headache No Nausea,vomiting or diarrhea  Telemetry (personally reviewed):NSR with occasional PVC's  Assessment/Plan: Acute on chronic combined systolic and diastolic heart failure (EF 20-25% by TTE on 4/29): Volume status improving-still has lower extremity edema. -14.6 L so far (urine output 5.6 L yesterday), weight decreased to 257 pounds (276 pounds on admission). CHF team following and directing Lasix dosing. Cardiology planning on RHC/LHC once more compensated   Acute kidney injury: Likely hemodynamically mediated-improving with diuretics. Follow  CAD s/p CABG: Underwent CABG in 2015-lost to follow-up since then. Continue aspirin and statin. See above regarding plans for LHC.  Pre diabetes: A1c 5.7-stable for outpatient follow-up.  Obesity: Counseled regarding importance of weight loss  ? OSA: Outpatient polysomnography  DVT Prophylaxis: Prophylactic Lovenox   Code Status: Full code   Family Communication: None at bedside  Disposition Plan: Remain inpatient-home later this week  Antimicrobial agents: Anti-infectives    None      Procedures: Echo 4/29>> - Left ventricle: The cavity size was moderately dilated. Wall   thickness was increased in a pattern of mild LVH. Systolic   function was severely reduced. The estimated ejection fraction   was in the range of 20% to 25%. Diffuse  hypokinesis. Doppler   parameters are consistent with both elevated ventricular   end-diastolic filling pressure and elevated left atrial filling   pressure. - Left atrium: The atrium was mildly dilated. - Right ventricle: The cavity size was moderately dilated. - Right atrium: The atrium was mildly dilated. - Atrial septum: No defect or patent foramen ovale was identified   CONSULTS:  cardiology  Time spent: 25 minutes-Greater than 50% of this time was spent in counseling, explanation of diagnosis, planning of further management, and coordination of care.  MEDICATIONS: Scheduled Meds: . aspirin EC  81 mg Oral Daily  . atorvastatin  40 mg Oral q1800  . enoxaparin (LOVENOX) injection  60 mg Subcutaneous Q24H  . furosemide  40 mg Intravenous BID  . losartan  25 mg Oral BID  . potassium chloride  20 mEq Oral BID  . sodium chloride flush  3 mL Intravenous Q12H  . spironolactone  12.5 mg Oral Daily   Continuous Infusions: . sodium chloride     PRN Meds:.sodium chloride, acetaminophen, hydrALAZINE, ondansetron (ZOFRAN) IV, sodium chloride flush   PHYSICAL EXAM: Vital signs: Vitals:   07/23/16 0828 07/23/16 1139 07/23/16 1921 07/24/16 0620  BP: (!) 131/94 (!) 133/94 133/77 122/77  Pulse:  97 88 85  Resp:  18 18 18   Temp:  98.9 F (37.2 C) 98.9 F (37.2 C) 99.3 F (37.4 C)  TempSrc:  Oral Oral Oral  SpO2:  99% 97% 96%  Weight:    116.8 kg (257 lb 6.4 oz)  Height:       Filed Weights   07/22/16  0503 07/23/16 0640 07/24/16 0620  Weight: 123 kg (271 lb 3.2 oz) 120.8 kg (266 lb 4.8 oz) 116.8 kg (257 lb 6.4 oz)   Body mass index is 34.91 kg/m.   General appearance :Awake, alert, not in any distress. Speech Clear. Not toxic Looking Eyes:, pupils equally reactive to light and accomodation,no scleral icterus.Pink conjunctiva HEENT: Atraumatic and Normocephalic Neck: supple, +JVD. No cervical lymphadenopathy. No thyromegaly Resp:Good air entry bilaterally, no added sounds    CVS: S1 S2 regular  GI: Bowel sounds present, Non tender and not distended with no gaurding, rigidity or rebound.No organomegaly Extremities: B/L Lower Ext shows ++ edema, both legs are warm to touch Neurology:  speech clear,Non focal, sensation is grossly intact. Psychiatric: Normal judgment and insight. Alert and oriented x 3. Normal mood. Musculoskeletal:No digital cyanosis Skin:No Rash, warm and dry Wounds:N/A  I have personally reviewed following labs and imaging studies  LABORATORY DATA: CBC:  Recent Labs Lab 07/21/16 0020  WBC 9.4  HGB 11.7*  HCT 39.3  MCV 73.3*  PLT 317    Basic Metabolic Panel:  Recent Labs Lab 07/21/16 0020 07/21/16 0727 07/22/16 0309 07/23/16 0402 07/24/16 0527  NA 136  --  137 135 137  K 4.0  --  3.4* 3.4* 3.7  CL 100*  --  99* 97* 100*  CO2 23  --  30 29 29   GLUCOSE 95  --  106* 106* 96  BUN 13  --  15 16 15   CREATININE 1.47*  --  1.42* 1.36* 1.26*  CALCIUM 8.7*  --  8.4* 8.2* 8.4*  MG  --  1.5*  --   --  2.2    GFR: Estimated Creatinine Clearance: 86.4 mL/min (A) (by C-G formula based on SCr of 1.26 mg/dL (H)).  Liver Function Tests:  Recent Labs Lab 07/21/16 0903 07/22/16 0309  AST 26 21  ALT 19 17  ALKPHOS 90 77  BILITOT 1.7* 1.0  PROT 5.7* 5.0*  ALBUMIN 3.1* 2.7*   No results for input(s): LIPASE, AMYLASE in the last 168 hours. No results for input(s): AMMONIA in the last 168 hours.  Coagulation Profile: No results for input(s): INR, PROTIME in the last 168 hours.  Cardiac Enzymes:  Recent Labs Lab 07/21/16 0727 07/21/16 1249 07/21/16 1843  TROPONINI 0.03* 0.04* 0.03*    BNP (last 3 results) No results for input(s): PROBNP in the last 8760 hours.  HbA1C: No results for input(s): HGBA1C in the last 72 hours.  CBG: No results for input(s): GLUCAP in the last 168 hours.  Lipid Profile: No results for input(s): CHOL, HDL, LDLCALC, TRIG, CHOLHDL, LDLDIRECT in the last 72 hours.  Thyroid Function  Tests: No results for input(s): TSH, T4TOTAL, FREET4, T3FREE, THYROIDAB in the last 72 hours.  Anemia Panel: No results for input(s): VITAMINB12, FOLATE, FERRITIN, TIBC, IRON, RETICCTPCT in the last 72 hours.  Urine analysis:    Component Value Date/Time   COLORURINE YELLOW 04/07/2013 2211   APPEARANCEUR CLEAR 04/07/2013 2211   LABSPEC 1.036 (H) 04/07/2013 2211   PHURINE 5.5 04/07/2013 2211   GLUCOSEU NEGATIVE 04/07/2013 2211   HGBUR NEGATIVE 04/07/2013 2211   BILIRUBINUR NEGATIVE 04/07/2013 2211   KETONESUR NEGATIVE 04/07/2013 2211   PROTEINUR NEGATIVE 04/07/2013 2211   UROBILINOGEN 0.2 04/07/2013 2211   NITRITE NEGATIVE 04/07/2013 2211   LEUKOCYTESUR NEGATIVE 04/07/2013 2211    Sepsis Labs: Lactic Acid, Venous No results found for: LATICACIDVEN  MICROBIOLOGY: No results found for this or any previous visit (from the  past 240 hour(s)).  RADIOLOGY STUDIES/RESULTS: Dg Chest 2 View  Result Date: 07/21/2016 CLINICAL DATA:  56 year old male with CHF. EXAM: CHEST  2 VIEW COMPARISON:  Chest radiograph dated 07/02/2016 FINDINGS: There is stable moderate cardiomegaly. Mild prominence of the central vasculature. There is opacification of the right lung base likely combination of a small right pleural effusion with associated atelectasis/ infiltrate. The left lung is clear. There is no pneumothorax. Median sternotomy wires and CABG vascular clips noted. No acute osseous pathology. IMPRESSION: 1. Small right pleural effusion with associated right lung base subsegmental atelectasis versus infiltrate. Clinical correlation is recommended. 2. Stable moderate cardiomegaly. Probable mild vascular congestion. No pulmonary edema. Electronically Signed   By: Elgie Collard M.D.   On: 07/21/2016 01:51   Ct Abdomen Pelvis W Contrast  Result Date: 07/11/2016 CLINICAL DATA:  56 year old male with history of nausea and vomiting with generalize abdominal pain and bloating for the past 3 days. Evaluate  for potential colitis or acute appendicitis. EXAM: CT ABDOMEN AND PELVIS WITH CONTRAST TECHNIQUE: Multidetector CT imaging of the abdomen and pelvis was performed using the standard protocol following bolus administration of intravenous contrast. CONTRAST:  ISOVUE-300 IOPAMIDOL (ISOVUE-300) INJECTION 61% COMPARISON:  No priors. FINDINGS: Lower chest: Cardiomegaly. Moderate to large right and small left pleural effusions lying dependently. Median sternotomy wires. Hepatobiliary: No suspicious cystic or solid hepatic lesions. No intra or extrahepatic biliary ductal dilatation. Gallbladder is normal in appearance. Pancreas: No pancreatic mass. No pancreatic ductal dilatation. No pancreatic or peripancreatic fluid or inflammatory changes. Spleen: Unremarkable. Adrenals/Urinary Tract: Bilateral adrenal glands and bilateral kidneys are normal in appearance. There is no hydroureteronephrosis. Urinary bladder is normal in appearance. Stomach/Bowel: The appearance of the stomach is normal. There is no pathologic dilatation of small bowel or colon. Normal appendix. Vascular/Lymphatic: Aortic atherosclerosis, without evidence of aneurysm or dissection in the abdominal or pelvic vasculature. No lymphadenopathy noted in the abdomen or pelvis. Reproductive: Prostate gland and seminal vesicles are unremarkable in appearance. Other: Small volume of ascites.  No pneumoperitoneum. Musculoskeletal: There are no aggressive appearing lytic or blastic lesions noted in the visualized portions of the skeleton. IMPRESSION: 1. Small volume of ascites. Diffuse body wall edema. Moderate to large right and small left pleural effusions. Findings may suggest a state of anasarca. 2. Normal appendix. 3. No definite imaging findings to suggest colitis at this time. 4. Aortic atherosclerosis. 5. Additional incidental findings, as above. Electronically Signed   By: Trudie Reed M.D.   On: 07/11/2016 12:15   Dg Abd Acute W/chest  Result  Date: 07/02/2016 CLINICAL DATA:  Acute generalized abdominal pain. EXAM: DG ABDOMEN ACUTE W/ 1V CHEST COMPARISON:  Radiographs of May 03, 2013. FINDINGS: Stable cardiomegaly. Status post coronary artery bypass graft. Stable mild central pulmonary vascular congestion. No pneumothorax or pleural effusion is noted. There is no evidence of bowel obstruction or ileus. Phleboliths are noted in the pelvis. IMPRESSION: No evidence of bowel obstruction or ileus. Stable cardiomegaly with mild central pulmonary vascular congestion. Electronically Signed   By: Lupita Raider, M.D.   On: 07/02/2016 07:55     LOS: 3 days   Jeoffrey Massed, MD  [facility] Pager:[phone]  If 7PM-7AM, please contact night-coverage [email or web address] Password [email or web address] 07/24/2016, 10:00 AM

## 2016-07-24 NOTE — Progress Notes (Signed)
Pt had 7 beats of Vtach, denies chest pain, denies nausea and vomiting, not in respiratory distress, asymptomatic. K. Craige Cotta was notified. Will continue to monitor pt.

## 2016-07-24 NOTE — Care Management Note (Signed)
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  Expected Discharge Date:     Possibly   07/28/2016         Expected Discharge Plan:  Home/Self Care  Discharge planning Services  CM Consult  Status of Service:  In process, will continue to follow  Reola Mosher [phone] 07/24/2016, 2:25 PM

## 2016-07-24 NOTE — Progress Notes (Signed)
Advanced Heart Failure Rounding Note  PCP:  Primary Cardiologist: New (Previously saw Dr. Lady Gary  Subjective:    Admitted 07/21/16 with A/C combined CHF.   Legs remain swollen, tight, and aching. Less orthopneic.  Not walking very much. States he feels "much" better than admit. Breathing improved.   Out 4.1 L and down another 9 lbs. Down 19 lbs so far.   Echo 07/21/16 EF 20-25%, moderate RV dilation. (Down from 55-60% in 2015 prior to CABG)  Objective:   Weight Range: 257 lb 6.4 oz (116.8 kg) Body mass index is 34.91 kg/m.   Vital Signs:   Temp:  [98.9 F (37.2 C)-99.3 F (37.4 C)] 99.3 F (37.4 C) (05/02 0620) Pulse Rate:  [85-97] 85 (05/02 0620) Resp:  [18] 18 (05/02 0620) BP: (122-133)/(77-94) 122/77 (05/02 0620) SpO2:  [96 %-99 %] 96 % (05/02 0620) Weight:  [257 lb 6.4 oz (116.8 kg)] 257 lb 6.4 oz (116.8 kg) (05/02 0620) Last BM Date: 07/23/16  Weight change: Filed Weights   07/22/16 0503 07/23/16 0640 07/24/16 0620  Weight: 271 lb 3.2 oz (123 kg) 266 lb 4.8 oz (120.8 kg) 257 lb 6.4 oz (116.8 kg)    Intake/Output:   Intake/Output Summary (Last 24 hours) at 07/24/16 0800 Last data filed at 07/24/16 0700  Gross per 24 hour  Intake             1440 ml  Output             5120 ml  Net            -3680 ml     Physical Exam: General:  Anxious male, NAD. Sitting on edge of bed.  HEENT: Normal Neck: supple. JVP remains elevated to jaw. Carotids 2+ bilat; no bruits. No lymphadenopathy or thyromegaly appreciated. Cor: PMI nondisplaced. Regular rate & rhythm. No rubs, gallops or murmurs. Lungs: Diminished basilar sounds with scattered crackles.  Abdomen: soft, nontender, mild distended. No hepatosplenomegaly. No bruits or masses. Good bowel sounds. Extremities: no cyanosis, clubbing, rash, 2-3+ pitting edema into thighs Neuro: alert & orientedx3, cranial nerves grossly intact. moves all 4 extremities w/o difficulty. Affect pleasant, though with pressures  speech/anxiety   Telemetry: Personally reviewed, NSR with PVCs  Labs: CBC No results for input(s): WBC, NEUTROABS, HGB, HCT, MCV, PLT in the last 72 hours. Basic Metabolic Panel  Recent Labs  07/23/16 0402 07/24/16 0527  NA 135 137  K 3.4* 3.7  CL 97* 100*  CO2 29 29  GLUCOSE 106* 96  BUN 16 15  CREATININE 1.36* 1.26*  CALCIUM 8.2* 8.4*  MG  --  2.2   Liver Function Tests  Recent Labs  07/21/16 0903 07/22/16 0309  AST 26 21  ALT 19 17  ALKPHOS 90 77  BILITOT 1.7* 1.0  PROT 5.7* 5.0*  ALBUMIN 3.1* 2.7*   No results for input(s): LIPASE, AMYLASE in the last 72 hours. Cardiac Enzymes  Recent Labs  07/21/16 1249 07/21/16 1843  TROPONINI 0.04* 0.03*    BNP: BNP (last 3 results)  Recent Labs  07/21/16 0020  BNP 30.6    ProBNP (last 3 results) No results for input(s): PROBNP in the last 8760 hours.   D-Dimer No results for input(s): DDIMER in the last 72 hours. Hemoglobin A1C No results for input(s): HGBA1C in the last 72 hours. Fasting Lipid Panel  Recent Labs  07/21/16 0903  CHOL 118  HDL 20*  LDLCALC 81  TRIG 83  CHOLHDL 5.9  Thyroid Function Tests No results for input(s): TSH, T4TOTAL, T3FREE, THYROIDAB in the last 72 hours.  Invalid input(s): FREET3  Other results:     Imaging/Studies:   No results found.    Medications:     Scheduled Medications: . aspirin EC  81 mg Oral Daily  . atorvastatin  40 mg Oral q1800  . enoxaparin (LOVENOX) injection  60 mg Subcutaneous Q24H  . furosemide  40 mg Intravenous BID  . losartan  25 mg Oral BID  . potassium chloride  20 mEq Oral BID  . sodium chloride flush  3 mL Intravenous Q12H     Infusions: . sodium chloride       PRN Medications:  sodium chloride, acetaminophen, hydrALAZINE, ondansetron (ZOFRAN) IV, sodium chloride flush   Assessment/Plan   1. Acute on chronic combined systolic and diastolic CHF: EF reduced to 20-25%, with history of CAD s/p CABG.  -  Continue to diurese well, but remains volume overloaded on exam.  - Continue lasix 40 mg BID with excellent response.  Place ted hose. Could consider additional dose of lasix today, but do not want to exceed capillary refill rate.  - Add spiro 12.5 mg daily - Consider digoxin.  - Continue losartan 25 mg BID. Consider Entresto tomorrow am (after ACEI washout) if pressures remain stable.   - Will need R/LHC once adequately diuresed.  - Consider BB once decompensation improved.  2. CAD s/p CABG - Troponin mildly elevated on admit, but no clear ACS symptoms - With drop in EF, will need to rule out graft disease as above.  - Continue ASA - Continue atorvastatin 40 mg daily.  - Hgb A1c pending for risk stratification.  3. Acute renal insufficiency:  - Improving with diuresis.  - Continue to follow closely with diuresis.  4. Questionable sleep apena - Will need outpatient sleep study. Body habitus + snoring.   - RV moderately dilated.  5. Hypokalemia - Improved. Continue supp.   6. Chronic back pain - prn tylenol. Further per primary.   Length of Stay: 3  Graciella Freer, Cordelia Poche  07/24/2016, 8:00 AM  Advanced Heart Failure Team Pager [phone] (M-F; 7a - 4p)  Please contact [facility] for night-coverage after hours (4p -7a ) and weekends on [email or web address]  Patient seen and examined with the above-signed Advanced Practice Provider and/or Housestaff. I personally reviewed laboratory data, imaging studies and relevant notes. I independently examined the patient and formulated the important aspects of the plan. I have edited the note to reflect any of my changes or salient points. I have personally discussed the plan with the patient and/or family.  Still with significant volume overload. Continue IV diuresis. Agree with med changes as above. Hypokalemia is improved. Almost certainly has severe OSA. Will need outpatient sleep study.  Will need R/L cath when diuresed. Possibly Friday.    Arvilla Meres, MD  8:06 PM

## 2016-07-25 DIAGNOSIS — I161 Hypertensive emergency: Secondary | ICD-10-CM

## 2016-07-25 LAB — CBC
HEMATOCRIT: 39 % (ref 39.0–52.0)
Hemoglobin: 11.6 g/dL — ABNORMAL LOW (ref 13.0–17.0)
MCH: 22 pg — ABNORMAL LOW (ref 26.0–34.0)
MCHC: 29.7 g/dL — ABNORMAL LOW (ref 30.0–36.0)
MCV: 74 fL — ABNORMAL LOW (ref 78.0–100.0)
PLATELETS: 350 10*3/uL (ref 150–400)
RBC: 5.27 MIL/uL (ref 4.22–5.81)
RDW: 17.9 % — AB (ref 11.5–15.5)
WBC: 9.2 10*3/uL (ref 4.0–10.5)

## 2016-07-25 LAB — BASIC METABOLIC PANEL
Anion gap: 11 (ref 5–15)
BUN: 17 mg/dL (ref 6–20)
CHLORIDE: 98 mmol/L — AB (ref 101–111)
CO2: 27 mmol/L (ref 22–32)
CREATININE: 1.23 mg/dL (ref 0.61–1.24)
Calcium: 8.4 mg/dL — ABNORMAL LOW (ref 8.9–10.3)
GFR calc non Af Amer: >60 mL/min (ref >60)
GLUCOSE: 94 mg/dL (ref 65–99)
Potassium: 4 mmol/L (ref 3.5–5.1)
Sodium: 136 mmol/L (ref 135–145)

## 2016-07-25 LAB — PROTIME-INR
INR: 1.23
PROTHROMBIN TIME: 15.6 s — AB (ref 11.4–15.2)

## 2016-07-25 LAB — HEMOGLOBIN A1C
Hgb A1c MFr Bld: 5.8 % — ABNORMAL HIGH (ref 4.8–5.6)
Mean Plasma Glucose: 120 mg/dL

## 2016-07-25 MED ORDER — TRAMADOL HCL 50 MG PO TABS
50.0000 mg | ORAL_TABLET | Freq: Four times a day (QID) | ORAL | Status: DC | PRN
  Administered 2016-07-25 – 2016-07-26 (×3): 50 mg via ORAL
  Filled 2016-07-25 (×3): qty 1

## 2016-07-25 MED ORDER — SACUBITRIL-VALSARTAN 24-26 MG PO TABS
1.0000 | ORAL_TABLET | Freq: Two times a day (BID) | ORAL | Status: DC
  Administered 2016-07-25 (×2): 1 via ORAL
  Filled 2016-07-25 (×3): qty 1

## 2016-07-25 MED ORDER — SODIUM CHLORIDE 0.9 % IV SOLN
INTRAVENOUS | Status: DC
  Administered 2016-07-26: 06:00:00 via INTRAVENOUS

## 2016-07-25 MED ORDER — SODIUM CHLORIDE 0.9% FLUSH: 3.0000 mL | INTRAVENOUS | Status: DC | PRN

## 2016-07-25 MED ORDER — SODIUM CHLORIDE 0.9% FLUSH
3.0000 mL | Freq: Two times a day (BID) | INTRAVENOUS | Status: DC
  Administered 2016-07-26: 3 mL via INTRAVENOUS

## 2016-07-25 MED ORDER — ZOLPIDEM TARTRATE 5 MG PO TABS
5.0000 mg | ORAL_TABLET | Freq: Once | ORAL | Status: AC
  Administered 2016-07-25: 5 mg via ORAL
  Filled 2016-07-25: qty 1

## 2016-07-25 MED ORDER — SODIUM CHLORIDE 0.9 % IV SOLN: 250.0000 mL | INTRAVENOUS | Status: DC | PRN

## 2016-07-25 NOTE — Telephone Encounter (Signed)
These have not been received yet. Will await fax.

## 2016-07-25 NOTE — Progress Notes (Signed)
Pt ambulated greater than 1200 feet with staff member. Pt on room air, no assistive devices and tolerated well   Joshua [name] [name]

## 2016-07-25 NOTE — Progress Notes (Signed)
Advanced Heart Failure Rounding Note  PCP: No PCP per patient.  Primary Cardiologist: New (Previously saw Dr. Lady Gary)  Subjective:    Admitted 07/21/16 with A/C combined CHF.   Echo 07/21/16 EF 20-25%, moderate RV dilation. (Down from 55-60% in 2015 prior to CABG)  Weight down 37 pounds overall. Feels much better, but having leg cramps overnight. Denies orthopnea and PND.   Objective:   Weight Range: 250 lb 11.2 oz (113.7 kg) Body mass index is 34 kg/m.   Vital Signs:   Temp:  [99.2 F (37.3 C)-99.8 F (37.7 C)] 99.2 F (37.3 C) (05/03 0435) Pulse Rate:  [87-92] 87 (05/03 0435) Resp:  [18-20] 18 (05/03 0435) BP: (127-129)/(76-96) 127/83 (05/03 0435) SpO2:  [97 %-100 %] 98 % (05/03 0435) Weight:  [250 lb 11.2 oz (113.7 kg)] 250 lb 11.2 oz (113.7 kg) (05/03 0435) Last BM Date: 07/24/16  Weight change: Filed Weights   07/23/16 0640 07/24/16 0620 07/25/16 0435  Weight: 266 lb 4.8 oz (120.8 kg) 257 lb 6.4 oz (116.8 kg) 250 lb 11.2 oz (113.7 kg)    Intake/Output:   Intake/Output Summary (Last 24 hours) at 07/25/16 0714 Last data filed at 07/25/16 0532  Gross per 24 hour  Intake             2423 ml  Output             5127 ml  Net            -2704 ml     Physical Exam: General:Well appearing male, NAD. Sitting on the side of the bed.  HEENT: normal Neck: supple. JVD 5-6. Carotids 2+ bilat; no bruits. No thyromegaly or nodule noted. Cor: PMI nondisplaced. RRR, No M/G/R noted Lungs: CTAB, normal effort. Abdomen: soft, non-tender, distended, no HSM. No bruits or masses. +BS  Extremities: no cyanosis, clubbing, rash,+ 2 pitting edema to knees.  Neuro: alert & orientedx3, cranial nerves grossly intact. moves all 4 extremities w/o difficulty. Affect pleasant   Telemetry: NSR with occasional PVC's  Labs: Basic Metabolic Panel  Recent Labs  07/24/16 0527 07/25/16 0340  NA 137 136  K 3.7 4.0  CL 100* 98*  CO2 29 27  GLUCOSE 96 94  BUN 15 17  CREATININE  1.26* 1.23  CALCIUM 8.4* 8.4*  MG 2.2  --     BNP: BNP (last 3 results)  Recent Labs  07/21/16 0020  BNP 30.6    Hemoglobin A1C  Recent Labs  07/24/16 0527  HGBA1C 5.8*    Imaging/Studies: Transthoracic Echocardiography  Study Conclusions  - Left ventricle: The cavity size was moderately dilated. Wall   thickness was increased in a pattern of mild LVH. Systolic   function was severely reduced. The estimated ejection fraction   was in the range of 20% to 25%. Diffuse hypokinesis. Doppler   parameters are consistent with both elevated ventricular   end-diastolic filling pressure and elevated left atrial filling   pressure. - Left atrium: The atrium was mildly dilated. - Right ventricle: The cavity size was moderately dilated. - Right atrium: The atrium was mildly dilated. - Atrial septum: No defect or patent foramen ovale was identified   Medications:     Scheduled Medications: . aspirin EC  81 mg Oral Daily  . atorvastatin  40 mg Oral q1800  . enoxaparin (LOVENOX) injection  60 mg Subcutaneous Q24H  . furosemide  40 mg Intravenous BID  . losartan  25 mg Oral BID  . potassium  chloride  20 mEq Oral BID  . sodium chloride flush  3 mL Intravenous Q12H  . spironolactone  12.5 mg Oral Daily    Infusions: . sodium chloride      PRN Medications: sodium chloride, acetaminophen, hydrALAZINE, ondansetron (ZOFRAN) IV, sodium chloride flush   Assessment/Plan   1. Acute on chronic combined systolic and diastolic CHF: EF reduced to 20-25%, with history of CAD s/p CABG.  - Having brisk diuresis with 40mg  IV BID Lasix.  - Could not tolerate TED hose.  - Continue Spiro 12.5mg  daily.  - Consider digoxin.  - Start Entresto 24/26mg  BID.    - R/LHC tomorrow.   - Consider BB once decompensation improved.  2. CAD s/p CABG - Troponin mildly elevated on admit, but no clear ACS symptoms - With drop in EF, will need to rule out graft disease as above.  - Continue ASA -  Continue atorvastatin 40 mg daily.  - For cath tomorrow.   3. Acute renal insufficiency:  - Improving with diuresis.  - Daily BMET  4. Questionable sleep apena - Will need outpatient sleep study. Body habitus + snoring.   - RV moderately dilated.  - No change to current plan.  5. Hypokalemia - Improved. Continue supp.   - No change to current plan. 6. Chronic back pain - prn tylenol. Further per primary.  7. Leg cramps - Will give tramadol prn. K is good. Can take mustard as needed Length of Stay: 4  Little Ishikawa, NP  07/25/2016, 7:14 AM  Advanced Heart Failure Team Pager [phone] (M-F; 7a - 4p)  Please contact [facility] for night-coverage after hours (4p -7a ) and weekends on [email or web address]  Patient seen and examined with Suzzette Righter, NP. We discussed all aspects of the encounter. I agree with the assessment and plan as stated above.   Volume status continues to improve. Still with 2+ edema. Renal function has improved with diuresis. No ischemic symptoms.   BP improved.  K improved.   Agree with addition of Entresto.  R/L cath tomorrow.   Arvilla Meres, MD  7:38 AM

## 2016-07-25 NOTE — Progress Notes (Signed)
Cardiology called stated cardiac catheterization tomorrow 07/26/2016 Pt aware  Joshua [name] [name]

## 2016-07-25 NOTE — Progress Notes (Signed)
RE: Benefit check  Received: Today  Message Contents  Mardene Sayer CMA        # 3  S/W MARIE @ [facility] # [id number]   ENTRESTO  24-26 MG BID   COVER- YES  CO-PAY- $ 47.00  PRIOR APPROVAL- YES # [phone]    SACUBITRIL-VALSARTAN- NONE FORMULARY   PHARMACY : [facility]

## 2016-07-25 NOTE — Progress Notes (Signed)
PROGRESS NOTE        PATIENT DETAILS Name: Joshua [name] Age: 56 y.o. Sex: male Date of Birth: 12/05/1960 Admit Date: 07/21/2016 Admitting Physician Haydee Salter, MD ZOX:[id number] Berton Lan, DO  Brief Narrative: Patient is a 56 y.o. male with a prior history of CAD status post CABG in 2015-lost to follow-up since then, admitted with acute on chronic systolic heart failure. Improving with diuresis, cardiology following. See below for further details  Subjective: Feels much better-continues to have decreased swelling in his legs. Able to ambulate in the hallway without much difficulty.  Telemetry (personally reviewed): Normal sinus rhythm-some occasional PVCs.  Assessment/Plan: Acute on chronic combined systolic and diastolic heart failure (EF 20-25% by TTE on 4/29): volume status continues to improve, lower extremity edema has markedly decreased. -17.3 L so far (urine output 5.1 L yesterday), weight decreased to 250 pounds (276 pounds on admission). CHF team following and directing Lasix dosing. Cardiology planning on RHC/LHC on 5/4  Acute kidney injury:Resoloved-likely due to hemodynamically mediated  CAD s/p CABG: Underwent CABG in 2015-lost to follow-up since then. Continue aspirin and statin. See above regarding plans for LHC.  Pre diabetes: A1c 5.7-stable for outpatient follow-up.  Obesity: Counseled regarding importance of weight loss  ? OSA: Outpatient polysomnography  DVT Prophylaxis: Prophylactic Lovenox   Code Status: Full code   Family Communication: None at bedside  Disposition Plan: Remain inpatient-home in the next few days once cleared by cardiology.  Antimicrobial agents: Anti-infectives    None      Procedures: Echo 4/29>> - Left ventricle: The cavity size was moderately dilated. Wall   thickness was increased in a pattern of mild LVH. Systolic   function was severely reduced. The estimated ejection fraction   was in the range  of 20% to 25%. Diffuse hypokinesis. Doppler   parameters are consistent with both elevated ventricular   end-diastolic filling pressure and elevated left atrial filling   pressure. - Left atrium: The atrium was mildly dilated. - Right ventricle: The cavity size was moderately dilated. - Right atrium: The atrium was mildly dilated. - Atrial septum: No defect or patent foramen ovale was identified   CONSULTS:  cardiology  Time spent: 25 minutes-Greater than 50% of this time was spent in counseling, explanation of diagnosis, planning of further management, and coordination of care.  MEDICATIONS: Scheduled Meds: . aspirin EC  81 mg Oral Daily  . atorvastatin  40 mg Oral q1800  . enoxaparin (LOVENOX) injection  60 mg Subcutaneous Q24H  . furosemide  40 mg Intravenous BID  . potassium chloride  20 mEq Oral BID  . sacubitril-valsartan  1 tablet Oral BID  . sodium chloride flush  3 mL Intravenous Q12H  . spironolactone  12.5 mg Oral Daily   Continuous Infusions: . sodium chloride     PRN Meds:.sodium chloride, acetaminophen, hydrALAZINE, ondansetron (ZOFRAN) IV, sodium chloride flush, traMADol   PHYSICAL EXAM: Vital signs: Vitals:   07/24/16 2143 07/25/16 0435 07/25/16 0805 07/25/16 1254  BP: (!) 129/96 127/83 122/79 137/89  Pulse: 92 87 90 90  Resp:  18 18 18   Temp:  99.2 F (37.3 C)  99.4 F (37.4 C)  TempSrc:  Oral  Oral  SpO2: 97% 98% 100% 99%  Weight:  113.7 kg (250 lb 11.2 oz)    Height:       [employer]  07/23/16 0640 07/24/16 0620 07/25/16 0435  Weight: 120.8 kg (266 lb 4.8 oz) 116.8 kg (257 lb 6.4 oz) 113.7 kg (250 lb 11.2 oz)   Body mass index is 34 kg/m.   General appearance :Awake, alert, not in any distress.  Eyes:, pupils equally reactive to light and accomodation,no scleral icterus. HEENT: Atraumatic and Normocephalic Neck: supple, no JVD. Resp:Good air entry bilaterally CVS: S1 S2 regular, no murmurs.  GI: Bowel sounds present, Non tender and  not distended with no gaurding, rigidity or rebound. Extremities: B/L Lower Ext shows + edema, both legs are warm to touch Neurology:  speech clear,Non focal, sensation is grossly intact. Psychiatric: Normal judgment and insight. Normal mood. Musculoskeletal:No digital cyanosis Skin:No Rash, warm and dry Wounds:N/A  I have personally reviewed following labs and imaging studies  LABORATORY DATA: CBC:  Recent Labs Lab 07/21/16 0020 07/25/16 1210  WBC 9.4 9.2  HGB 11.7* 11.6*  HCT 39.3 39.0  MCV 73.3* 74.0*  PLT 317 350    Basic Metabolic Panel:  Recent Labs Lab 07/21/16 0020 07/21/16 0727 07/22/16 0309 07/23/16 0402 07/24/16 0527 07/25/16 0340  NA 136  --  137 135 137 136  K 4.0  --  3.4* 3.4* 3.7 4.0  CL 100*  --  99* 97* 100* 98*  CO2 23  --  30 29 29 27   GLUCOSE 95  --  106* 106* 96 94  BUN 13  --  15 16 15 17   CREATININE 1.47*  --  1.42* 1.36* 1.26* 1.23  CALCIUM 8.7*  --  8.4* 8.2* 8.4* 8.4*  MG  --  1.5*  --   --  2.2  --     GFR: Estimated Creatinine Clearance: 87.3 mL/min (by C-G formula based on SCr of 1.23 mg/dL).  Liver Function Tests:  Recent Labs Lab 07/21/16 0903 07/22/16 0309  AST 26 21  ALT 19 17  ALKPHOS 90 77  BILITOT 1.7* 1.0  PROT 5.7* 5.0*  ALBUMIN 3.1* 2.7*   No results for input(s): LIPASE, AMYLASE in the last 168 hours. No results for input(s): AMMONIA in the last 168 hours.  Coagulation Profile:  Recent Labs Lab 07/25/16 1210  INR 1.23    Cardiac Enzymes:  Recent Labs Lab 07/21/16 0727 07/21/16 1249 07/21/16 1843  TROPONINI 0.03* 0.04* 0.03*    BNP (last 3 results) No results for input(s): PROBNP in the last 8760 hours.  HbA1C:  Recent Labs  07/24/16 0527  HGBA1C 5.8*    CBG: No results for input(s): GLUCAP in the last 168 hours.  Lipid Profile: No results for input(s): CHOL, HDL, LDLCALC, TRIG, CHOLHDL, LDLDIRECT in the last 72 hours.  Thyroid Function Tests: No results for input(s): TSH,  T4TOTAL, FREET4, T3FREE, THYROIDAB in the last 72 hours.  Anemia Panel: No results for input(s): VITAMINB12, FOLATE, FERRITIN, TIBC, IRON, RETICCTPCT in the last 72 hours.  Urine analysis:    Component Value Date/Time   COLORURINE YELLOW 04/07/2013 2211   APPEARANCEUR CLEAR 04/07/2013 2211   LABSPEC 1.036 (H) 04/07/2013 2211   PHURINE 5.5 04/07/2013 2211   GLUCOSEU NEGATIVE 04/07/2013 2211   HGBUR NEGATIVE 04/07/2013 2211   BILIRUBINUR NEGATIVE 04/07/2013 2211   KETONESUR NEGATIVE 04/07/2013 2211   PROTEINUR NEGATIVE 04/07/2013 2211   UROBILINOGEN 0.2 04/07/2013 2211   NITRITE NEGATIVE 04/07/2013 2211   LEUKOCYTESUR NEGATIVE 04/07/2013 2211    Sepsis Labs: Lactic Acid, Venous No results found for: LATICACIDVEN  MICROBIOLOGY: No results found for this or any previous visit (  from the past 240 hour(s)).  RADIOLOGY STUDIES/RESULTS: Dg Chest 2 View  Result Date: 07/21/2016 CLINICAL DATA:  56 year old male with CHF. EXAM: CHEST  2 VIEW COMPARISON:  Chest radiograph dated 07/02/2016 FINDINGS: There is stable moderate cardiomegaly. Mild prominence of the central vasculature. There is opacification of the right lung base likely combination of a small right pleural effusion with associated atelectasis/ infiltrate. The left lung is clear. There is no pneumothorax. Median sternotomy wires and CABG vascular clips noted. No acute osseous pathology. IMPRESSION: 1. Small right pleural effusion with associated right lung base subsegmental atelectasis versus infiltrate. Clinical correlation is recommended. 2. Stable moderate cardiomegaly. Probable mild vascular congestion. No pulmonary edema. Electronically Signed   By: Elgie Collard M.D.   On: 07/21/2016 01:51   Ct Abdomen Pelvis W Contrast  Result Date: 07/11/2016 CLINICAL DATA:  56 year old male with history of nausea and vomiting with generalize abdominal pain and bloating for the past 3 days. Evaluate for potential colitis or acute  appendicitis. EXAM: CT ABDOMEN AND PELVIS WITH CONTRAST TECHNIQUE: Multidetector CT imaging of the abdomen and pelvis was performed using the standard protocol following bolus administration of intravenous contrast. CONTRAST:  ISOVUE-300 IOPAMIDOL (ISOVUE-300) INJECTION 61% COMPARISON:  No priors. FINDINGS: Lower chest: Cardiomegaly. Moderate to large right and small left pleural effusions lying dependently. Median sternotomy wires. Hepatobiliary: No suspicious cystic or solid hepatic lesions. No intra or extrahepatic biliary ductal dilatation. Gallbladder is normal in appearance. Pancreas: No pancreatic mass. No pancreatic ductal dilatation. No pancreatic or peripancreatic fluid or inflammatory changes. Spleen: Unremarkable. Adrenals/Urinary Tract: Bilateral adrenal glands and bilateral kidneys are normal in appearance. There is no hydroureteronephrosis. Urinary bladder is normal in appearance. Stomach/Bowel: The appearance of the stomach is normal. There is no pathologic dilatation of small bowel or colon. Normal appendix. Vascular/Lymphatic: Aortic atherosclerosis, without evidence of aneurysm or dissection in the abdominal or pelvic vasculature. No lymphadenopathy noted in the abdomen or pelvis. Reproductive: Prostate gland and seminal vesicles are unremarkable in appearance. Other: Small volume of ascites.  No pneumoperitoneum. Musculoskeletal: There are no aggressive appearing lytic or blastic lesions noted in the visualized portions of the skeleton. IMPRESSION: 1. Small volume of ascites. Diffuse body wall edema. Moderate to large right and small left pleural effusions. Findings may suggest a state of anasarca. 2. Normal appendix. 3. No definite imaging findings to suggest colitis at this time. 4. Aortic atherosclerosis. 5. Additional incidental findings, as above. Electronically Signed   By: Trudie Reed M.D.   On: 07/11/2016 12:15   Dg Abd Acute W/chest  Result Date: 07/02/2016 CLINICAL DATA:   Acute generalized abdominal pain. EXAM: DG ABDOMEN ACUTE W/ 1V CHEST COMPARISON:  Radiographs of May 03, 2013. FINDINGS: Stable cardiomegaly. Status post coronary artery bypass graft. Stable mild central pulmonary vascular congestion. No pneumothorax or pleural effusion is noted. There is no evidence of bowel obstruction or ileus. Phleboliths are noted in the pelvis. IMPRESSION: No evidence of bowel obstruction or ileus. Stable cardiomegaly with mild central pulmonary vascular congestion. Electronically Signed   By: Lupita Raider, M.D.   On: 07/02/2016 07:55     LOS: 4 days   Jeoffrey Massed, MD  [facility] Pager:[phone]  If 7PM-7AM, please contact night-coverage [email or web address] Password [email or web address] 07/25/2016, 2:15 PM

## 2016-07-25 NOTE — Progress Notes (Signed)
Heart Failure Navigator Consult Note  Presentation: Joshua [name] is a 56 year old male with a past medical history of HTN and CAD s/p CABG in Jan. 2015. He has had no follow up since, has only been taking an aspirin daily.   About a month ago he noticed increasing abdominal discomfort, weight gain, early satiety, and PND. He went to an urgent care and was eventually referred to pulmonology and was given 20mg  oral lasix, but his symptoms persisted. His SOB and abdominal pain worsened over the past week to the point that he was making himself throw up to relieve abdominal discomfort. He eats a high salt diet, lots of canned foods including Joshua [name]. Also drinking more than 2L, drinks Gatorade often.   Upon arrival to the ED on 07/21/16, o2 saturations in the low 90's. Creatinine 1.47, troponin 0.03. BNP 30. He has gotten 2 days of IV diuresis, weight down 10 pounds (first weight charted is likely inaccurate), -11L so far. Echo shows an EF of 20-25%, RV moderately dilated. His last Echo was in 2015, prior to CABG and EF was 55-60%.   Past Medical History:  Diagnosis Date  . Acid reflux   . Anasarca   . Arthritis    SHOULDERS  . CHF (congestive heart failure) (HCC)   . Coronary artery disease 04/07/2013   cath by Dr. Lady Gary at [facility]  . Essential hypertension, benign 04/06/2013  . Hypertension   . Kidney stone   . Non-STEMI (non-ST elevated myocardial infarction) (HCC) 04/06/2013  . NSTEMI (non-ST elevated myocardial infarction) (HCC)   . Obesity, unspecified 04/07/2013  . 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   LIMA to LAD, SVG to OM2, SVG to OM3, SVG to PDA, EVH via right thigh and bilateral lower legs    Social History   Social History  . Marital status: Single    Spouse name: Joshua [name]  . Number of children: Joshua [name]  . Years of education: Joshua [name]   Occupational History  . house remodeler      currently out of work  .  [employer]   Social History Main Topics  . Smoking status: Never Smoker   . Smokeless tobacco: Never Used     Comment: iI  DIPPED TOBACCO AT ONE TOME"  . Alcohol use No  . Drug use: No  . Sexual activity: Not Asked   Other Topics Concern  . 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 Wall   thickness was increased in a pattern of mild LVH. Systolic   function was severely reduced. The estimated ejection fraction   was in the range of 20% to 25%. Diffuse hypokinesis. Doppler   parameters are consistent with both elevated ventricular   end-diastolic filling pressure and elevated left atrial filling   pressure. - Left atrium: The atrium was mildly dilated. - Right ventricle: The cavity size was moderately dilated. - Right atrium: The atrium was mildly dilated. - Atrial septum: No defect or patent foramen ovale was identified.  ------------------------------------------------------------------- Study data:  Comparison was made to the study of 04/12/2013.  Study status:  Routine.  Procedure:  Transthoracic echocardiography. Image quality was fair.          Transthoracic echocardiography. M-mode, complete 2D, spectral Doppler, and color Doppler. Birthdate:  Patient birthdate: 24-May-1960.  Age:  Patient is 56 yr old.  Sex:  Gender: male.    BMI: 37.5 kg/m^2.  Blood pressure: 140/89  Patient status:  Inpatient.  Study date:  Study date: 07/21/2016. Study time: 03:00 PM.  Location:  Bedside.  BNP    Component Value Date/Time   BNP 30.6 07/21/2016 0020    ProBNP No results found for: PROBNP   Education Assessment and Provision:  Detailed education and instructions provided on heart failure disease management including the following:  Signs and symptoms of Heart Failure When to call the physician Importance of daily weights Low sodium diet Fluid restriction Medication  management Anticipated future follow-up appointments  Patient education given on each of the above topics.  Patient acknowledges understanding and acceptance of all instructions.  I spent some time speaking with Joshua [name] regarding his HF and current hospitalization.  He was very teary regarding his health and his admitted lack of attention to it prior to now.  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 He is very concerned about his finances and has lost the subsidy for his health insurance through his work due to "using all my days" -- is now responsible for $500 per month.  He does not have a scale and I will provide one for home use. We discussed the importance of daily weights and when to contact the physician.  I reviewed a low sodium diet and high sodium foods to avoid.  He admits that he has recently been eating "canned soups" often due to stomach pain and fullness.  He believes that he can get medications at discharge --yet is already concerned about "Joshua [name]" because he heard it was "expensive".  I will send referral to Elizabeth Palau (HF Clinic Pharm D) for medication assistance for [facility].  I will also send a referral to Lasandra Beech (HF LCSW) for assistance with finances and social support.   He lives alone in [place].  He will follow in the [facility] Clinic after discharge.  Education Materials:  "Living Better With Heart Failure" Booklet, Daily Weight Tracker Tool    High Risk Criteria for Readmission and/or Poor Patient Outcomes:  (Recommend Follow-up with Advanced Heart Failure Clinic)--yes   EF <30%- 20-25%  2 or more admissions in 6 months- No  Difficult social situation- No  Demonstrates medication noncompliance- yes -he admits to not taking medications in the past and was not taking on admission   Barriers of Care:  New HF, Knowledge and compliance  Discharge Planning:   Plans to return to [place] home alone

## 2016-07-25 NOTE — Progress Notes (Signed)
   Patient unavailable (sleeping) during multiple attempted visits (14:05, 15:10).  Will follow.  Page on-call chaplain, as needed.  - Rev. Chaplain Kipp Brood MDiv ThM

## 2016-07-26 ENCOUNTER — Encounter (HOSPITAL_COMMUNITY)
Admission: EM | Disposition: A | Discharge status: Home / Self Care | Payer: Self-pay | Source: Home / Self Care | Attending: Internal Medicine

## 2016-07-26 DIAGNOSIS — I5023 Acute on chronic systolic (congestive) heart failure: Secondary | ICD-10-CM

## 2016-07-26 DIAGNOSIS — N179 Acute kidney failure, unspecified: Secondary | ICD-10-CM

## 2016-07-26 HISTORY — PX: RIGHT/LEFT HEART CATH AND CORONARY/GRAFT ANGIOGRAPHY: CATH118267

## 2016-07-26 LAB — POCT I-STAT 3, ART BLOOD GAS (G3+)
Acid-Base Excess: 1 mmol/L (ref 0.0–2.0)
Bicarbonate: 24.5 mmol/L (ref 20.0–28.0)
O2 Saturation: 96 %
PCO2 ART: 35.6 mmHg (ref 32.0–48.0)
PH ART: 7.445 (ref 7.350–7.450)
TCO2: 26 mmol/L (ref 0–100)
pO2, Arterial: 79 mmHg — ABNORMAL LOW (ref 83.0–108.0)

## 2016-07-26 LAB — BASIC METABOLIC PANEL
Anion gap: 10 (ref 5–15)
BUN: 15 mg/dL (ref 6–20)
CO2: 26 mmol/L (ref 22–32)
CREATININE: 1.25 mg/dL — AB (ref 0.61–1.24)
Calcium: 8.3 mg/dL — ABNORMAL LOW (ref 8.9–10.3)
Chloride: 100 mmol/L — ABNORMAL LOW (ref 101–111)
Glucose, Bld: 102 mg/dL — ABNORMAL HIGH (ref 65–99)
POTASSIUM: 4 mmol/L (ref 3.5–5.1)
Sodium: 136 mmol/L (ref 135–145)

## 2016-07-26 LAB — POCT I-STAT 3, VENOUS BLOOD GAS (G3P V)
ACID-BASE EXCESS: 2 mmol/L (ref 0.0–2.0)
Acid-Base Excess: 2 mmol/L (ref 0.0–2.0)
Bicarbonate: 26.7 mmol/L (ref 20.0–28.0)
Bicarbonate: 27.6 mmol/L (ref 20.0–28.0)
O2 SAT: 67 %
O2 Saturation: 66 %
PCO2 VEN: 43.8 mmHg — AB (ref 44.0–60.0)
PH VEN: 7.407 (ref 7.250–7.430)
PO2 VEN: 34 mmHg (ref 32.0–45.0)
PO2 VEN: 35 mmHg (ref 32.0–45.0)
TCO2: 28 mmol/L (ref 0–100)
TCO2: 29 mmol/L (ref 0–100)
pCO2, Ven: 43 mmHg — ABNORMAL LOW (ref 44.0–60.0)
pH, Ven: 7.402 (ref 7.250–7.430)

## 2016-07-26 LAB — CBC
HCT: 42 % (ref 39.0–52.0)
HEMOGLOBIN: 12.8 g/dL — AB (ref 13.0–17.0)
MCH: 22.5 pg — ABNORMAL LOW (ref 26.0–34.0)
MCHC: 30.5 g/dL (ref 30.0–36.0)
MCV: 73.9 fL — ABNORMAL LOW (ref 78.0–100.0)
PLATELETS: 285 10*3/uL (ref 150–400)
RBC: 5.68 MIL/uL (ref 4.22–5.81)
RDW: 18.4 % — ABNORMAL HIGH (ref 11.5–15.5)
WBC: 7.6 10*3/uL (ref 4.0–10.5)

## 2016-07-26 LAB — CREATININE, SERUM: Creatinine, Ser: 1.12 mg/dL (ref 0.61–1.24)

## 2016-07-26 LAB — MAGNESIUM: MAGNESIUM: 2.2 mg/dL (ref 1.7–2.4)

## 2016-07-26 SURGERY — RIGHT/LEFT HEART CATH AND CORONARY/GRAFT ANGIOGRAPHY
Anesthesia: LOCAL

## 2016-07-26 MED ORDER — SODIUM CHLORIDE 0.9 % IV SOLN
INTRAVENOUS | Status: AC
  Administered 2016-07-26: 16:00:00 via INTRAVENOUS

## 2016-07-26 MED ORDER — FENTANYL CITRATE (PF) 100 MCG/2ML IJ SOLN
INTRAMUSCULAR | Status: DC | PRN
  Administered 2016-07-26 (×3): 25 ug via INTRAVENOUS

## 2016-07-26 MED ORDER — MIDAZOLAM HCL 2 MG/2ML IJ SOLN
INTRAMUSCULAR | Status: AC
  Filled 2016-07-26: qty 2

## 2016-07-26 MED ORDER — IOPAMIDOL (ISOVUE-370) INJECTION 76%
INTRAVENOUS | Status: DC | PRN
  Administered 2016-07-26: 90 mL via INTRA_ARTERIAL

## 2016-07-26 MED ORDER — SODIUM CHLORIDE 0.9% FLUSH
3.0000 mL | Freq: Two times a day (BID) | INTRAVENOUS | Status: DC
  Administered 2016-07-26 – 2016-07-27 (×2): 3 mL via INTRAVENOUS

## 2016-07-26 MED ORDER — IOPAMIDOL (ISOVUE-370) INJECTION 76%
INTRAVENOUS | Status: AC
  Filled 2016-07-26: qty 100

## 2016-07-26 MED ORDER — ENOXAPARIN SODIUM 40 MG/0.4ML ~~LOC~~ SOLN
40.0000 mg | SUBCUTANEOUS | Status: DC
  Administered 2016-07-27: 40 mg via SUBCUTANEOUS
  Filled 2016-07-26: qty 0.4

## 2016-07-26 MED ORDER — ONDANSETRON HCL 4 MG/2ML IJ SOLN: 4.0000 mg | Freq: Four times a day (QID) | INTRAMUSCULAR | Status: DC | PRN

## 2016-07-26 MED ORDER — HEPARIN (PORCINE) IN NACL 2-0.9 UNIT/ML-% IJ SOLN
INTRAMUSCULAR | Status: AC
  Filled 2016-07-26: qty 1000

## 2016-07-26 MED ORDER — LIDOCAINE HCL 1 % IJ SOLN
INTRAMUSCULAR | Status: AC
  Filled 2016-07-26: qty 20

## 2016-07-26 MED ORDER — ZOLPIDEM TARTRATE 5 MG PO TABS
5.0000 mg | ORAL_TABLET | Freq: Every evening | ORAL | Status: DC | PRN
  Administered 2016-07-26: 5 mg via ORAL
  Filled 2016-07-26: qty 1

## 2016-07-26 MED ORDER — MIDAZOLAM HCL 2 MG/2ML IJ SOLN
INTRAMUSCULAR | Status: DC | PRN
  Administered 2016-07-26: 1 mg via INTRAVENOUS
  Administered 2016-07-26 (×2): 2 mg via INTRAVENOUS

## 2016-07-26 MED ORDER — ACETAMINOPHEN 325 MG PO TABS: 650.0000 mg | ORAL_TABLET | ORAL | Status: DC | PRN

## 2016-07-26 MED ORDER — SODIUM CHLORIDE 0.9% FLUSH: 3.0000 mL | INTRAVENOUS | Status: DC | PRN

## 2016-07-26 MED ORDER — LIDOCAINE HCL (PF) 1 % IJ SOLN
INTRAMUSCULAR | Status: DC | PRN
  Administered 2016-07-26: 15 mL

## 2016-07-26 MED ORDER — SACUBITRIL-VALSARTAN 49-51 MG PO TABS
1.0000 | ORAL_TABLET | Freq: Two times a day (BID) | ORAL | Status: DC
  Administered 2016-07-26 – 2016-07-27 (×2): 1 via ORAL
  Filled 2016-07-26 (×3): qty 1

## 2016-07-26 MED ORDER — FENTANYL CITRATE (PF) 100 MCG/2ML IJ SOLN
INTRAMUSCULAR | Status: AC
  Filled 2016-07-26: qty 2

## 2016-07-26 MED ORDER — HEPARIN (PORCINE) IN NACL 2-0.9 UNIT/ML-% IJ SOLN
INTRAMUSCULAR | Status: DC | PRN
  Administered 2016-07-26: 1000 mL

## 2016-07-26 MED ORDER — ENOXAPARIN SODIUM 40 MG/0.4ML ~~LOC~~ SOLN: 40.0000 mg | SUBCUTANEOUS | Status: DC

## 2016-07-26 MED ORDER — SODIUM CHLORIDE 0.9 % IV SOLN: 250.0000 mL | INTRAVENOUS | Status: DC | PRN

## 2016-07-26 SURGICAL SUPPLY — 8 items
CATH INFINITI 5FR MULTPACK ANG (CATHETERS) ×2 IMPLANT
CATH SWAN GANZ 7F STRAIGHT (CATHETERS) ×2 IMPLANT
KIT HEART LEFT (KITS) ×2 IMPLANT
PACK CARDIAC CATHETERIZATION (CUSTOM PROCEDURE TRAY) ×2 IMPLANT
SHEATH PINNACLE 5F 10CM (SHEATH) ×2 IMPLANT
SHEATH PINNACLE 7F 10CM (SHEATH) ×2 IMPLANT
TRANSDUCER W/STOPCOCK (MISCELLANEOUS) ×2 IMPLANT
WIRE EMERALD 3MM-J .035X150CM (WIRE) ×2 IMPLANT

## 2016-07-26 NOTE — H&P (View-Only) (Signed)
Advanced Heart Failure Rounding Note  PCP: No PCP per patient.  Primary Cardiologist: New (Previously saw Dr. Lady Gary)  Subjective:    Admitted 07/21/16 with A/C combined CHF.   Echo 07/21/16 EF 20-25%, moderate RV dilation. (Down from 55-60% in 2015 prior to CABG)  Weight down 43 pounds total. Feels well, breathing much better. Denies orthopnea and chest pain.   Objective:   Weight Range: 244 lb (110.7 kg) Body mass index is 33.09 kg/m.   Vital Signs:   Temp:  [98.7 F (37.1 C)-99.4 F (37.4 C)] 98.7 F (37.1 C) (05/04 0512) Pulse Rate:  [86-90] 86 (05/04 0512) Resp:  [18] 18 (05/04 0512) BP: (122-139)/(79-95) 131/95 (05/04 0512) SpO2:  [99 %-100 %] 99 % (05/04 0512) Weight:  [244 lb (110.7 kg)] 244 lb (110.7 kg) (05/04 0512) Last BM Date: 07/25/16  Weight change: Filed Weights   07/24/16 0620 07/25/16 0435 07/26/16 0512  Weight: 257 lb 6.4 oz (116.8 kg) 250 lb 11.2 oz (113.7 kg) 244 lb (110.7 kg)    Intake/Output:   Intake/Output Summary (Last 24 hours) at 07/26/16 0747 Last data filed at 07/26/16 0547  Gross per 24 hour  Intake              840 ml  Output             4275 ml  Net            -3435 ml     Physical Exam: General:Well appearing male. NAD. Sitting up in bed.   HEENT: normal Neck: supple. JVP 7-8cm.  Carotids 2+ bilat; no bruits. No thyromegaly or nodule noted. Cor: PMI nondisplaced. Regular rate and rhythm. No murmurs, rubs or gallops.  Lungs: CTAB. No wheezing.  Abdomen: obese, soft, non-tender, distended, no HSM. No bruits or masses. + bowel sounds.  Extremities: no cyanosis, clubbing, rash, 2+ pitting edema to knees.  Neuro: alert & orientedx3, cranial nerves grossly intact. moves all 4 extremities w/o difficulty. Affect pleasant   Telemetry: NSR with occasional PVC's  Labs: Basic Metabolic Panel  Recent Labs  07/24/16 0527 07/25/16 0340 07/26/16 0250  NA 137 136 136  K 3.7 4.0 4.0  CL 100* 98* 100*  CO2 29 27 26   GLUCOSE 96  94 102*  BUN 15 17 15   CREATININE 1.26* 1.23 1.25*  CALCIUM 8.4* 8.4* 8.3*  MG 2.2  --  2.2    BNP: BNP (last 3 results)  Recent Labs  07/21/16 0020  BNP 30.6    Hemoglobin A1C  Recent Labs  07/24/16 0527  HGBA1C 5.8*    Imaging/Studies: Transthoracic Echocardiography  Study Conclusions  - Left ventricle: The cavity size was moderately dilated. Wall   thickness was increased in a pattern of mild LVH. Systolic   function was severely reduced. The estimated ejection fraction   was in the range of 20% to 25%. Diffuse hypokinesis. Doppler   parameters are consistent with both elevated ventricular   end-diastolic filling pressure and elevated left atrial filling   pressure. - Left atrium: The atrium was mildly dilated. - Right ventricle: The cavity size was moderately dilated. - Right atrium: The atrium was mildly dilated. - Atrial septum: No defect or patent foramen ovale was identified   Medications:     Scheduled Medications: . aspirin EC  81 mg Oral Daily  . atorvastatin  40 mg Oral q1800  . enoxaparin (LOVENOX) injection  60 mg Subcutaneous Q24H  . furosemide  40 mg Intravenous BID  .  potassium chloride  20 mEq Oral BID  . sacubitril-valsartan  1 tablet Oral BID  . sodium chloride flush  3 mL Intravenous Q12H  . sodium chloride flush  3 mL Intravenous Q12H  . spironolactone  12.5 mg Oral Daily    Infusions: . sodium chloride    . sodium chloride    . sodium chloride 10 mL/hr at 07/26/16 0542    PRN Medications: sodium chloride, sodium chloride, acetaminophen, hydrALAZINE, ondansetron (ZOFRAN) IV, sodium chloride flush, sodium chloride flush, traMADol   Assessment/Plan   1. Acute on chronic combined systolic and diastolic CHF: EF reduced to 20-25%, with history of CAD s/p CABG.  - Brisk diuresis with IV Lasix, remains volume overloaded on exam. Still with 2+ edema to knees. Will continue IV lasix today.  - Could not tolerate TED hose.  - Continue  Spiro 12.5mg  daily.  - Continue Entresto 24/26 mg BID.  - For R/L heart cath today.  - Consider BB once decompensation improved.  2. CAD s/p CABG - Troponin mildly elevated on admit, but no clear ACS symptoms - With drop in EF, will need to rule out graft disease as above.  - Continue ASA - Continue atorvastatin 40 mg daily.  - For cath today.  3. Acute renal insufficiency:  - Improving with diuresis.  - Daily BMET  - No change to current plan.  4. Questionable sleep apena - Will need outpatient sleep study. Body habitus + snoring.   - RV moderately dilated.  - Continue current medical management.   5. Hypokalemia - Continue supplement.  6. Chronic back pain - prn tylenol. Further per primary. - Improved.   7. Leg cramps - Improved.  - Electrolytes ok.  Length of Stay: 5  Little Ishikawa, NP  07/26/2016, 7:47 AM  Little Ishikawa, NP-C Advanced Heart Failure Team  Pager [phone] M-F 7am-4pm.  Please contact [facility] for night-coverage after hours (4p -7a ) and weekends on [email or web address]  Patient seen and examined with Suzzette Righter, NP. We discussed all aspects of the encounter. I agree with the assessment and plan as stated above.   Much improved. Weight down 43 pounds. Now ambulating without CP or SOB. Hypokalemia resolved. Renal function stable.   Plan R/L cath today. Likely home later today or in am depending on results of cath.   Will need outpatient sleep study.  Arvilla Meres, MD  12:57 PM

## 2016-07-26 NOTE — Interval H&P Note (Signed)
History and Physical Interval Note:  07/26/2016 1:12 PM  Joshua [name]  has presented today for surgery, with the diagnosis of hf  The various methods of treatment have been discussed with the patient and family. After consideration of risks, benefits and other options for treatment, the patient has consented to  Procedure(s): Right/Left Heart Cath and Coronary/Graft Angiography (N/A) and possible coronary angioplasty as a surgical intervention .  The patient's history has been reviewed, patient examined, no change in status, stable for surgery.  I have reviewed the patient's chart and labs.  Questions were answered to the patient's satisfaction.     Abdalrahman Clementson, Reuel Boom

## 2016-07-26 NOTE — Progress Notes (Signed)
Patient has been NPO after midnight, ready for procedure.  Slept mostly during the night.   Will continue to monitor.  Avion Patella, RN

## 2016-07-26 NOTE — Progress Notes (Signed)
PROGRESS NOTE        PATIENT DETAILS Name: Joshua [name] Age: 56 y.o. Sex: male Date of Birth: Aug 23, 1960 Admit Date: 07/21/2016 Admitting Physician Haydee Salter, MD ZOX:[id number] Berton Lan, DO  Brief Narrative: Patient is a 56 y.o. male with a prior history of CAD status post CABG in 2015-lost to follow-up since then, admitted with acute on chronic systolic heart failure. Improving with diuresis, cardiology following. See below for further details  Subjective: Feels much better-leg edema has significantly reduced.  Denies any chest pain. Shortness of breath has markedly improved-he has been ambulating in the hallway.  Telemetry (personally reviewed): NSR  Assessment/Plan: Acute on chronic combined systolic and diastolic heart failure (EF 20-25% by TTE on 4/29): volume status has markedly improved, minimal lower extremity edema. -20.3 L so far , weight decreased to 244 pounds (276 pounds on admission). CHF team following, planning on our RHC/LHC today. Continue current dosing of diuretics.   Acute kidney injury: Resolved-AKI likely hemodynamically mediated.   Microcytic anemia: Stable for outpatient follow-up-no overt GI bleeding at this time. Have recommended that the patient follow with his PCP for elective outpatient colonoscopy.  CAD s/p CABG: Underwent CABG in 2015-lost to follow-up since then. Continue aspirin and statin. See above regarding plans for LHC.  Pre diabetes: A1c 5.7-stable for outpatient follow-up.  Obesity: Counseled regarding importance of weight loss  ? OSA: Outpatient polysomnography  DVT Prophylaxis: Prophylactic Lovenox   Code Status: Full code   Family Communication: Present at bedside  Disposition Plan: Remain inpatient-home soon once cleared by cardiology.  Antimicrobial agents: Anti-infectives    None      Procedures: Echo 4/29>> - Left ventricle: The cavity size was moderately dilated. Wall   thickness was  increased in a pattern of mild LVH. Systolic   function was severely reduced. The estimated ejection fraction   was in the range of 20% to 25%. Diffuse hypokinesis. Doppler   parameters are consistent with both elevated ventricular   end-diastolic filling pressure and elevated left atrial filling   pressure. - Left atrium: The atrium was mildly dilated. - Right ventricle: The cavity size was moderately dilated. - Right atrium: The atrium was mildly dilated. - Atrial septum: No defect or patent foramen ovale was identified   CONSULTS:  cardiology  Time spent: 25 minutes-Greater than 50% of this time was spent in counseling, explanation of diagnosis, planning of further management, and coordination of care.  MEDICATIONS: Scheduled Meds: . aspirin EC  81 mg Oral Daily  . atorvastatin  40 mg Oral q1800  . enoxaparin (LOVENOX) injection  40 mg Subcutaneous Q24H  . furosemide  40 mg Intravenous BID  . potassium chloride  20 mEq Oral BID  . sacubitril-valsartan  1 tablet Oral BID  . sodium chloride flush  3 mL Intravenous Q12H  . sodium chloride flush  3 mL Intravenous Q12H  . spironolactone  12.5 mg Oral Daily   Continuous Infusions: . sodium chloride    . sodium chloride    . sodium chloride 10 mL/hr at 07/26/16 0542   PRN Meds:.sodium chloride, sodium chloride, acetaminophen, hydrALAZINE, ondansetron (ZOFRAN) IV, sodium chloride flush, sodium chloride flush, traMADol   PHYSICAL EXAM: Vital signs: Vitals:   07/25/16 0805 07/25/16 1254 07/25/16 2151 07/26/16 0512  BP: 122/79 137/89 139/90 (!) 131/95  Pulse: 90 90 88 86  Resp:  18 18 18 18   Temp:  99.4 F (37.4 C) 99.2 F (37.3 C) 98.7 F (37.1 C)  TempSrc:  Oral Oral Oral  SpO2: 100% 99% 100% 99%  Weight:    110.7 kg (244 lb)  Height:       Filed Weights   07/24/16 0620 07/25/16 0435 07/26/16 0512  Weight: 116.8 kg (257 lb 6.4 oz) 113.7 kg (250 lb 11.2 oz) 110.7 kg (244 lb)   Body mass index is 33.09 kg/m.    General appearance :Awake, alert, not in any distress.  Eyes:, pupils equally reactive to light and accomodation,no scleral icterus. HEENT: Atraumatic and Normocephalic Neck: supple, no JVD. Resp:Good air entry bilaterally CVS: S1 S2 regular.  GI: Bowel sounds present, Non tender and not distended with no gaurding, rigidity or rebound. Extremities: B/L Lower Ext shows + edema, both legs are warm to touch Neurology:  speech clear,Non focal, sensation is grossly intact. Psychiatric: Normal judgment and insight. Normal mood. Musculoskeletal:No digital cyanosis Skin:No Rash, warm and dry Wounds:N/A  I have personally reviewed following labs and imaging studies  LABORATORY DATA: CBC:  Recent Labs Lab 07/21/16 0020 07/25/16 1210  WBC 9.4 9.2  HGB 11.7* 11.6*  HCT 39.3 39.0  MCV 73.3* 74.0*  PLT 317 350    Basic Metabolic Panel:  Recent Labs Lab 07/21/16 0727 07/22/16 0309 07/23/16 0402 07/24/16 0527 07/25/16 0340 07/26/16 0250  NA  --  137 135 137 136 136  K  --  3.4* 3.4* 3.7 4.0 4.0  CL  --  99* 97* 100* 98* 100*  CO2  --  30 29 29 27 26   GLUCOSE  --  106* 106* 96 94 102*  BUN  --  15 16 15 17 15   CREATININE  --  1.42* 1.36* 1.26* 1.23 1.25*  CALCIUM  --  8.4* 8.2* 8.4* 8.4* 8.3*  MG 1.5*  --   --  2.2  --  2.2    GFR: Estimated Creatinine Clearance: 84.7 mL/min (A) (by C-G formula based on SCr of 1.25 mg/dL (H)).  Liver Function Tests:  Recent Labs Lab 07/21/16 0903 07/22/16 0309  AST 26 21  ALT 19 17  ALKPHOS 90 77  BILITOT 1.7* 1.0  PROT 5.7* 5.0*  ALBUMIN 3.1* 2.7*   No results for input(s): LIPASE, AMYLASE in the last 168 hours. No results for input(s): AMMONIA in the last 168 hours.  Coagulation Profile:  Recent Labs Lab 07/25/16 1210  INR 1.23    Cardiac Enzymes:  Recent Labs Lab 07/21/16 0727 07/21/16 1249 07/21/16 1843  TROPONINI 0.03* 0.04* 0.03*    BNP (last 3 results) No results for input(s): PROBNP in the last 8760  hours.  HbA1C:  Recent Labs  07/24/16 0527  HGBA1C 5.8*    CBG: No results for input(s): GLUCAP in the last 168 hours.  Lipid Profile: No results for input(s): CHOL, HDL, LDLCALC, TRIG, CHOLHDL, LDLDIRECT in the last 72 hours.  Thyroid Function Tests: No results for input(s): TSH, T4TOTAL, FREET4, T3FREE, THYROIDAB in the last 72 hours.  Anemia Panel: No results for input(s): VITAMINB12, FOLATE, FERRITIN, TIBC, IRON, RETICCTPCT in the last 72 hours.  Urine analysis:    Component Value Date/Time   COLORURINE YELLOW 04/07/2013 2211   APPEARANCEUR CLEAR 04/07/2013 2211   LABSPEC 1.036 (H) 04/07/2013 2211   PHURINE 5.5 04/07/2013 2211   GLUCOSEU NEGATIVE 04/07/2013 2211   HGBUR NEGATIVE 04/07/2013 2211   BILIRUBINUR NEGATIVE 04/07/2013 2211   KETONESUR NEGATIVE 04/07/2013 2211  PROTEINUR NEGATIVE 04/07/2013 2211   UROBILINOGEN 0.2 04/07/2013 2211   NITRITE NEGATIVE 04/07/2013 2211   LEUKOCYTESUR NEGATIVE 04/07/2013 2211    Sepsis Labs: Lactic Acid, Venous No results found for: LATICACIDVEN  MICROBIOLOGY: No results found for this or any previous visit (from the past 240 hour(s)).  RADIOLOGY STUDIES/RESULTS: Dg Chest 2 View  Result Date: 07/21/2016 CLINICAL DATA:  56 year old male with CHF. EXAM: CHEST  2 VIEW COMPARISON:  Chest radiograph dated 07/02/2016 FINDINGS: There is stable moderate cardiomegaly. Mild prominence of the central vasculature. There is opacification of the right lung base likely combination of a small right pleural effusion with associated atelectasis/ infiltrate. The left lung is clear. There is no pneumothorax. Median sternotomy wires and CABG vascular clips noted. No acute osseous pathology. IMPRESSION: 1. Small right pleural effusion with associated right lung base subsegmental atelectasis versus infiltrate. Clinical correlation is recommended. 2. Stable moderate cardiomegaly. Probable mild vascular congestion. No pulmonary edema. Electronically  Signed   By: Elgie Collard M.D.   On: 07/21/2016 01:51   Ct Abdomen Pelvis W Contrast  Result Date: 07/11/2016 CLINICAL DATA:  56 year old male with history of nausea and vomiting with generalize abdominal pain and bloating for the past 3 days. Evaluate for potential colitis or acute appendicitis. EXAM: CT ABDOMEN AND PELVIS WITH CONTRAST TECHNIQUE: Multidetector CT imaging of the abdomen and pelvis was performed using the standard protocol following bolus administration of intravenous contrast. CONTRAST:  ISOVUE-300 IOPAMIDOL (ISOVUE-300) INJECTION 61% COMPARISON:  No priors. FINDINGS: Lower chest: Cardiomegaly. Moderate to large right and small left pleural effusions lying dependently. Median sternotomy wires. Hepatobiliary: No suspicious cystic or solid hepatic lesions. No intra or extrahepatic biliary ductal dilatation. Gallbladder is normal in appearance. Pancreas: No pancreatic mass. No pancreatic ductal dilatation. No pancreatic or peripancreatic fluid or inflammatory changes. Spleen: Unremarkable. Adrenals/Urinary Tract: Bilateral adrenal glands and bilateral kidneys are normal in appearance. There is no hydroureteronephrosis. Urinary bladder is normal in appearance. Stomach/Bowel: The appearance of the stomach is normal. There is no pathologic dilatation of small bowel or colon. Normal appendix. Vascular/Lymphatic: Aortic atherosclerosis, without evidence of aneurysm or dissection in the abdominal or pelvic vasculature. No lymphadenopathy noted in the abdomen or pelvis. Reproductive: Prostate gland and seminal vesicles are unremarkable in appearance. Other: Small volume of ascites.  No pneumoperitoneum. Musculoskeletal: There are no aggressive appearing lytic or blastic lesions noted in the visualized portions of the skeleton. IMPRESSION: 1. Small volume of ascites. Diffuse body wall edema. Moderate to large right and small left pleural effusions. Findings may suggest a state of anasarca. 2.  Normal appendix. 3. No definite imaging findings to suggest colitis at this time. 4. Aortic atherosclerosis. 5. Additional incidental findings, as above. Electronically Signed   By: Trudie Reed M.D.   On: 07/11/2016 12:15   Dg Abd Acute W/chest  Result Date: 07/02/2016 CLINICAL DATA:  Acute generalized abdominal pain. EXAM: DG ABDOMEN ACUTE W/ 1V CHEST COMPARISON:  Radiographs of May 03, 2013. FINDINGS: Stable cardiomegaly. Status post coronary artery bypass graft. Stable mild central pulmonary vascular congestion. No pneumothorax or pleural effusion is noted. There is no evidence of bowel obstruction or ileus. Phleboliths are noted in the pelvis. IMPRESSION: No evidence of bowel obstruction or ileus. Stable cardiomegaly with mild central pulmonary vascular congestion. Electronically Signed   By: Lupita Raider, M.D.   On: 07/02/2016 07:55     LOS: 5 days   Jeoffrey Massed, MD  [facility] Pager:[phone]  If 7PM-7AM, please contact  night-coverage [email or web address] Password [email or web address] 07/26/2016, 12:32 PM

## 2016-07-26 NOTE — Progress Notes (Addendum)
Advanced Heart Failure Rounding Note  PCP: No PCP per patient.  Primary Cardiologist: New (Previously saw Dr. Lady Gary)  Subjective:    Admitted 07/21/16 with A/C combined CHF.   Echo 07/21/16 EF 20-25%, moderate RV dilation. (Down from 55-60% in 2015 prior to CABG)  Weight down 43 pounds total. Feels well, breathing much better. Denies orthopnea and chest pain.   Objective:   Weight Range: 244 lb (110.7 kg) Body mass index is 33.09 kg/m.   Vital Signs:   Temp:  [98.7 F (37.1 C)-99.4 F (37.4 C)] 98.7 F (37.1 C) (05/04 0512) Pulse Rate:  [86-90] 86 (05/04 0512) Resp:  [18] 18 (05/04 0512) BP: (122-139)/(79-95) 131/95 (05/04 0512) SpO2:  [99 %-100 %] 99 % (05/04 0512) Weight:  [244 lb (110.7 kg)] 244 lb (110.7 kg) (05/04 0512) Last BM Date: 07/25/16  Weight change: Filed Weights   07/24/16 0620 07/25/16 0435 07/26/16 0512  Weight: 257 lb 6.4 oz (116.8 kg) 250 lb 11.2 oz (113.7 kg) 244 lb (110.7 kg)    Intake/Output:   Intake/Output Summary (Last 24 hours) at 07/26/16 0747 Last data filed at 07/26/16 0547  Gross per 24 hour  Intake              840 ml  Output             4275 ml  Net            -3435 ml     Physical Exam: General:Well appearing male. NAD. Sitting up in bed.   HEENT: normal Neck: supple. JVP 7-8cm.  Carotids 2+ bilat; no bruits. No thyromegaly or nodule noted. Cor: PMI nondisplaced. Regular rate and rhythm. No murmurs, rubs or gallops.  Lungs: CTAB. No wheezing.  Abdomen: obese, soft, non-tender, distended, no HSM. No bruits or masses. + bowel sounds.  Extremities: no cyanosis, clubbing, rash, 2+ pitting edema to knees.  Neuro: alert & orientedx3, cranial nerves grossly intact. moves all 4 extremities w/o difficulty. Affect pleasant   Telemetry: NSR with occasional PVC's  Labs: Basic Metabolic Panel  Recent Labs  07/24/16 0527 07/25/16 0340 07/26/16 0250  NA 137 136 136  K 3.7 4.0 4.0  CL 100* 98* 100*  CO2 29 27 26   GLUCOSE 96  94 102*  BUN 15 17 15   CREATININE 1.26* 1.23 1.25*  CALCIUM 8.4* 8.4* 8.3*  MG 2.2  --  2.2    BNP: BNP (last 3 results)  Recent Labs  07/21/16 0020  BNP 30.6    Hemoglobin A1C  Recent Labs  07/24/16 0527  HGBA1C 5.8*    Imaging/Studies: Transthoracic Echocardiography  Study Conclusions  - Left ventricle: The cavity size was moderately dilated. Wall   thickness was increased in a pattern of mild LVH. Systolic   function was severely reduced. The estimated ejection fraction   was in the range of 20% to 25%. Diffuse hypokinesis. Doppler   parameters are consistent with both elevated ventricular   end-diastolic filling pressure and elevated left atrial filling   pressure. - Left atrium: The atrium was mildly dilated. - Right ventricle: The cavity size was moderately dilated. - Right atrium: The atrium was mildly dilated. - Atrial septum: No defect or patent foramen ovale was identified   Medications:     Scheduled Medications: . aspirin EC  81 mg Oral Daily  . atorvastatin  40 mg Oral q1800  . enoxaparin (LOVENOX) injection  60 mg Subcutaneous Q24H  . furosemide  40 mg Intravenous BID  .  potassium chloride  20 mEq Oral BID  . sacubitril-valsartan  1 tablet Oral BID  . sodium chloride flush  3 mL Intravenous Q12H  . sodium chloride flush  3 mL Intravenous Q12H  . spironolactone  12.5 mg Oral Daily    Infusions: . sodium chloride    . sodium chloride    . sodium chloride 10 mL/hr at 07/26/16 0542    PRN Medications: sodium chloride, sodium chloride, acetaminophen, hydrALAZINE, ondansetron (ZOFRAN) IV, sodium chloride flush, sodium chloride flush, traMADol   Assessment/Plan   1. Acute on chronic combined systolic and diastolic CHF: EF reduced to 20-25%, with history of CAD s/p CABG.  - Brisk diuresis with IV Lasix, remains volume overloaded on exam. Still with 2+ edema to knees. Will continue IV lasix today.  - Could not tolerate TED hose.  - Continue  Spiro 12.5mg  daily.  - Continue Entresto 24/26 mg BID.  - For R/L heart cath today.  - Consider BB once decompensation improved.  2. CAD s/p CABG - Troponin mildly elevated on admit, but no clear ACS symptoms - With drop in EF, will need to rule out graft disease as above.  - Continue ASA - Continue atorvastatin 40 mg daily.  - For cath today.  3. Acute renal insufficiency:  - Improving with diuresis.  - Daily BMET  - No change to current plan.  4. Questionable sleep apena - Will need outpatient sleep study. Body habitus + snoring.   - RV moderately dilated.  - Continue current medical management.   5. Hypokalemia - Continue supplement.  6. Chronic back pain - prn tylenol. Further per primary. - Improved.   7. Leg cramps - Improved.  - Electrolytes ok.  Length of Stay: 5  Little Ishikawa, NP  07/26/2016, 7:47 AM  Little Ishikawa, NP-C Advanced Heart Failure Team  Pager [phone] M-F 7am-4pm.  Please contact [facility] for night-coverage after hours (4p -7a ) and weekends on [email or web address]  Patient seen and examined with Suzzette Righter, NP. We discussed all aspects of the encounter. I agree with the assessment and plan as stated above.   Much improved. Weight down 43 pounds. But still with lower extremity edema. Now ambulating without CP or SOB. Hypokalemia resolved. Renal function stable.   Plan R/L cath today. Likely would benefit from further diuresis.   Will need outpatient sleep study.  Arvilla Meres, MD  12:57 PM

## 2016-07-26 NOTE — Progress Notes (Addendum)
Magnesium added to lab work this morning per doctor Roby, cardiology. Will continue to monitor.   Monica Zahler, RN

## 2016-07-26 NOTE — Progress Notes (Signed)
Site area: Rt fem art and Rt fem venous Site Prior to Removal:  Level 0 Pressure Applied For: 20 min Manual:   yes Patient Status During Pull:  A/O Post Pull Site:  Level 0 Post Pull Instructions Given:  Yes and pt understands post sheath removal instructions Post Pull Pulses Present: 2+ rt dp/pt Dressing Applied: Tegaderm and a 4x4  Bedrest begins @ 15:15:00 Comments: Pt leaves cath lab holding area in stable condition. Rt groin is unremarkable and rt groin dressing is CDI.

## 2016-07-26 NOTE — Progress Notes (Addendum)
Entresto coupon card given to patient with explanation of usage; Pharmacy of choice is [facility], [place] [phone]) CM called the pharmacist and they do have his medication in stock. Abelino Derrick RN,MHA,BSN

## 2016-07-27 LAB — CBC WITH DIFFERENTIAL/PLATELET
BASOS ABS: 0 10*3/uL (ref 0.0–0.1)
BASOS PCT: 0 %
EOS ABS: 0.2 10*3/uL (ref 0.0–0.7)
Eosinophils Relative: 2 %
HCT: 41.5 % (ref 39.0–52.0)
HEMOGLOBIN: 12.4 g/dL — AB (ref 13.0–17.0)
LYMPHS PCT: 15 %
Lymphs Abs: 1.3 10*3/uL (ref 0.7–4.0)
MCH: 22.1 pg — ABNORMAL LOW (ref 26.0–34.0)
MCHC: 29.9 g/dL — ABNORMAL LOW (ref 30.0–36.0)
MCV: 73.8 fL — AB (ref 78.0–100.0)
MONOS PCT: 7 %
Monocytes Absolute: 0.6 10*3/uL (ref 0.1–1.0)
NEUTROS PCT: 76 %
Neutro Abs: 6.3 10*3/uL (ref 1.7–7.7)
Platelets: 348 10*3/uL (ref 150–400)
RBC: 5.62 MIL/uL (ref 4.22–5.81)
RDW: 18.3 % — ABNORMAL HIGH (ref 11.5–15.5)
WBC: 8.4 10*3/uL (ref 4.0–10.5)

## 2016-07-27 LAB — BASIC METABOLIC PANEL
ANION GAP: 8 (ref 5–15)
BUN: 17 mg/dL (ref 6–20)
CALCIUM: 8.7 mg/dL — AB (ref 8.9–10.3)
CO2: 28 mmol/L (ref 22–32)
Chloride: 99 mmol/L — ABNORMAL LOW (ref 101–111)
Creatinine, Ser: 1.24 mg/dL (ref 0.61–1.24)
GLUCOSE: 92 mg/dL (ref 65–99)
POTASSIUM: 4 mmol/L (ref 3.5–5.1)
Sodium: 135 mmol/L (ref 135–145)

## 2016-07-27 MED ORDER — CARVEDILOL 3.125 MG PO TABS: 3.1250 mg | ORAL_TABLET | Freq: Two times a day (BID) | ORAL | 0 refills | Status: DC

## 2016-07-27 MED ORDER — FUROSEMIDE 40 MG PO TABS
40.0000 mg | ORAL_TABLET | Freq: Every day | ORAL | 0 refills | Status: DC
Start: 2016-07-27 — End: 2016-08-05

## 2016-07-27 MED ORDER — ATORVASTATIN CALCIUM 40 MG PO TABS: 40.0000 mg | ORAL_TABLET | Freq: Every day | ORAL | 0 refills | Status: DC

## 2016-07-27 MED ORDER — SPIRONOLACTONE 25 MG PO TABS: 12.5000 mg | ORAL_TABLET | Freq: Every day | ORAL | 0 refills | Status: DC

## 2016-07-27 MED ORDER — SACUBITRIL-VALSARTAN 49-51 MG PO TABS: 1.0000 | ORAL_TABLET | Freq: Two times a day (BID) | ORAL | 0 refills | Status: DC

## 2016-07-27 MED ORDER — POTASSIUM CHLORIDE CRYS ER 20 MEQ PO TBCR: 20.0000 meq | EXTENDED_RELEASE_TABLET | Freq: Every day | ORAL | 0 refills | Status: DC

## 2016-07-27 NOTE — Progress Notes (Addendum)
After discharging pt called back to Nurse regarding his medicine about 4pm thatTab [name] does not covered by insurance, so nurse called Social worker and MD, Social worker said that she will give a call to the patient

## 2016-07-27 NOTE — Progress Notes (Addendum)
Advanced Heart Failure Rounding Note  PCP: No PCP per patient.  Primary Cardiologist: New (Previously saw Dr. Lady Gary)  Subjective:    Admitted 07/21/16 with A/C combined CHF in setting of medication noncompliance and uncontrolled HTN.    Echo 07/21/16 EF 20-25%, moderate RV dilation. (Down from 55-60% in 2015 prior to CABG)  Cath yesterday with patent grafts. Well compensated hemodynamics.   Weight down 51 pounds total. Feels good no CP or SOB. Groin site ok. Creatinine stable.     Objective:   Weight Range: 107.3 kg (236 lb 9.6 oz) Body mass index is 32.09 kg/m.   Vital Signs:   Temp:  [98.4 F (36.9 C)-99.5 F (37.5 C)] 98.8 F (37.1 C) (05/05 0523) Pulse Rate:  [77-98] 89 (05/05 0523) Resp:  [0-28] 18 (05/05 0523) BP: (112-142)/(66-99) 114/76 (05/05 0523) SpO2:  [91 %-100 %] 97 % (05/05 0523) Weight:  [107.3 kg (236 lb 9.6 oz)] 107.3 kg (236 lb 9.6 oz) (05/05 0523) Last BM Date: 07/26/16  Weight change: Filed Weights   07/25/16 0435 07/26/16 0512 07/27/16 0523  Weight: 113.7 kg (250 lb 11.2 oz) 110.7 kg (244 lb) 107.3 kg (236 lb 9.6 oz)    Intake/Output:   Intake/Output Summary (Last 24 hours) at 07/27/16 1109 Last data filed at 07/27/16 0900  Gross per 24 hour  Intake              840 ml  Output             3300 ml  Net            -2460 ml     Physical Exam: General:  Well appearing. No resp difficulty HEENT: normal Neck: supple. no JVD. Carotids 2+ bilat; no bruits. No lymphadenopathy or thryomegaly appreciated. Cor: PMI nondisplaced. Regular rate & rhythm. No rubs, gallops or murmurs. Lungs: clear Abdomen: soft, nontender, nondistended. No hepatosplenomegaly. No bruits or masses. Good bowel sounds. Extremities: no cyanosis, clubbing, rash, edema R groin site ok Neuro: alert & orientedx3, cranial nerves grossly intact. moves all 4 extremities w/o difficulty. Affect pleasant   Telemetry: NSR 80s with occasional PVC's Personally  reviewed   Labs: Basic Metabolic Panel  Recent Labs  07/26/16 0250 07/26/16 1618 07/27/16 0159  NA 136  --  135  K 4.0  --  4.0  CL 100*  --  99*  CO2 26  --  28  GLUCOSE 102*  --  92  BUN 15  --  17  CREATININE 1.25* 1.12 1.24  CALCIUM 8.3*  --  8.7*  MG 2.2  --   --     BNP: BNP (last 3 results)  Recent Labs  07/21/16 0020  BNP 30.6    Hemoglobin A1C No results for input(s): HGBA1C in the last 72 hours.  Imaging/Studies: Transthoracic Echocardiography  Study Conclusions  - Left ventricle: The cavity size was moderately dilated. Wall   thickness was increased in a pattern of mild LVH. Systolic   function was severely reduced. The estimated ejection fraction   was in the range of 20% to 25%. Diffuse hypokinesis. Doppler   parameters are consistent with both elevated ventricular   end-diastolic filling pressure and elevated left atrial filling   pressure. - Left atrium: The atrium was mildly dilated. - Right ventricle: The cavity size was moderately dilated. - Right atrium: The atrium was mildly dilated. - Atrial septum: No defect or patent foramen ovale was identified   Medications:  Scheduled Medications: . aspirin EC  81 mg Oral Daily  . atorvastatin  40 mg Oral q1800  . enoxaparin (LOVENOX) injection  40 mg Subcutaneous Q24H  . furosemide  40 mg Intravenous BID  . potassium chloride  20 mEq Oral BID  . sacubitril-valsartan  1 tablet Oral BID  . sodium chloride flush  3 mL Intravenous Q12H  . sodium chloride flush  3 mL Intravenous Q12H  . spironolactone  12.5 mg Oral Daily    Infusions: . sodium chloride    . sodium chloride      PRN Medications: sodium chloride, sodium chloride, acetaminophen, hydrALAZINE, ondansetron (ZOFRAN) IV, sodium chloride flush, sodium chloride flush, traMADol, zolpidem   Assessment/Plan   1. Acute on chronic combined systolic and diastolic CHF: EF reduced to 20-25%, with history of CAD s/p CABG.  2. CAD  s/p CABG 3. Acute renal insufficiency, resolved  4. Questionable sleep apena - Will need outpatient sleep study. Body habitus + snoring.   5. Hypokalemia 6. Chronic back pain 7. Leg cramps  Much improved. Has diuresed over 50 pounds. Cath reviewed with him. Grafts patent. Suspect uncontrolled HTN and OSA led to decreased LV function.   Can go home today on:  ECASA 81 Entresto 49/51 bid  Spiro 12.5 daily Lasix 40 daily (take extra as needed for weight gain) Atorva 40 daily Carvedilol 3.125 bid  F/u in HF clinic next week. Will need outpatient sleep study.    Length of Stay: 6  Arvilla Meres, MD  07/27/2016, 11:09 AM  Advanced Heart Failure Team  Pager [phone] M-F 7am-4pm.  Please contact [facility] for night-coverage after hours (4p -7a ) and weekends on [email or web address]

## 2016-07-27 NOTE — Progress Notes (Signed)
Pt got discharged, discharge instructions provided and patient showed understanding to it, IV taken out,Telemonitor DC,pt left unit in via ambulation with all of the belongings accompanied by his Girlfriend.

## 2016-07-27 NOTE — Discharge Summary (Signed)
PATIENT DETAILS Name: Napoleon Monacelli Age: 56 y.o. Sex: male Date of Birth: 30-Jan-1961 MRN: [id number]. Admitting Physician: Haydee Salter, MD WUJ:[id number], Verdis Frederickson, DO  Admit Date: 07/21/2016 Discharge date: 07/27/2016  Recommendations for Outpatient Follow-up:  1. Follow up with PCP in 1-2 weeks 2. Please obtain BMP/CBC in one week 3. Will need outpatient sleep study 4. Ensure follow up at CHF clinic. 5. Has microcytic anemia- will need screening colonoscopy if he is not up-to-date. No overt GI bleeding. This is being deferred to the outpatient setting  Admitted From:  Home  Disposition: Home   Home Health: No  Equipment/Devices: None  Discharge Condition: Stable  CODE STATUS: FULL CODE  Diet recommendation:  Heart Healthy   Brief Summary: See H&P, Labs, Consult and Test reports for all details in brief, Patient is a 56 y.o. male with a prior history of CAD status post CABG in 2015-lost to follow-up since then, admitted with acute on chronic systolic heart failure. Improving with diuresis, cardiology following. See below for further details  Brief Hospital Course: Acute on chronic combined systolic and diastolic heart failure (EF 20-25% by TTE on 4/29): volume status has markedly improved, minimal lower extremity edema. -23 L so far , weight decreased to 236 pounds (276 pounds on admission). CHF team followed closely, underwent  RHC/LHC on 5/4-patent grafts-felt to have nonischemic cardiomyopathy. Recommendations are to discharge on current dosing of Entresto (confirmed with Dr. Gala Romney), Lasix, Aldactone and carvedilol. Patient will follow with the CHF clinic next week.  Acute kidney injury: Resolved-AKI likely hemodynamically mediated.   Microcytic anemia: Stable for outpatient follow-up-no overt GI bleeding at this time. Have recommended that the patient follow with his PCP for elective outpatient colonoscopy.  CAD s/p CABG: Underwent CABG in 2015-lost to follow-up  since then. Continue aspirin and statin. See above regarding LHC LHC.  Pre diabetes: A1c 5.7-stable for outpatient follow-up.  Obesity: Counseled regarding importance of weight loss  ? OSA: Outpatient polysomnography  Procedures/Studies: Echo 4/29>> - Left ventricle: The cavity size was moderately dilated. Wall thickness was increased in a pattern of mild LVH. Systolic function was severely reduced. The estimated ejection fraction was in the range of 20% to 25%. Diffuse hypokinesis. Doppler parameters are consistent with both elevated ventricular end-diastolic filling pressure and elevated left atrial filling pressure. - Left atrium: The atrium was mildly dilated. - Right ventricle: The cavity size was moderately dilated. - Right atrium: The atrium was mildly dilated. - Atrial septum: No defect or patent foramen ovale was identified  LHC 5/4>> 1. Severe 3v CAD 2. All grafts widely patent    --LIMA to LAD    --SVG sequential to OM1 and OM2    --SVG to RPDA 3. Severe LV dysfunction EF 20-25% 4. Well compensated hemodynamics  Discharge Diagnoses:  Principal Problem:   Acute respiratory failure (HCC) Active Problems:   Acid reflux   Essential hypertension, benign   Obesity, unspecified   Coronary artery disease   Hypertensive emergency   Acute on chronic heart failure (HCC)   Acute kidney injury (HCC)   Acute on chronic systolic congestive heart failure [facility])   Discharge Instructions:  Activity:  As tolerated   Discharge Instructions    (HEART FAILURE PATIENTS) Call MD:  Anytime you have any of the following symptoms: 1) 3 pound weight gain in 24 hours or 5 pounds in 1 week 2) shortness of breath, with or without a dry hacking cough 3) swelling in the hands, feet or stomach  4) if you have to sleep on extra pillows at night in order to breathe.    Complete by:  As directed    Diet - low sodium heart healthy    Complete by:  As directed    Heart Failure  patients record your daily weight using the same scale at the same time of day    Complete by:  As directed    Increase activity slowly    Complete by:  As directed      Allergies as of 07/27/2016   No Known Allergies     Medication List    TAKE these medications   aspirin 81 MG tablet Take 1 tablet (81 mg total) by mouth daily.   atorvastatin 40 MG tablet Commonly known as:  LIPITOR Take 1 tablet (40 mg total) by mouth daily at 6 PM.   carvedilol 3.125 MG tablet Commonly known as:  COREG Take 1 tablet (3.125 mg total) by mouth 2 (two) times daily with a meal.   furosemide 40 MG tablet Commonly known as:  LASIX Take 1 tablet (40 mg total) by mouth daily. What changed:  medication strength  how much to take   potassium chloride SA 20 MEQ tablet Commonly known as:  K-DUR,KLOR-CON Take 1 tablet (20 mEq total) by mouth daily.   sacubitril-valsartan 49-51 MG Commonly known as:  ENTRESTO Take 1 tablet by mouth 2 (two) times daily.   spironolactone 25 MG tablet Commonly known as:  ALDACTONE Take 0.5 tablets (12.5 mg total) by mouth daily. Start taking on:  07/28/2016      Follow-up Information    Tommie Sams, DO Follow up on 08/23/2016.   Specialty:  Family Medicine Why:  2:30 pm; please try to keep your apt or call to reschedule Contact information: [address] Laurell Joseph[address] [place] [address] [phone]        [facility] Follow up on 08/05/2016.   Specialty:  Cardiology Why:  at 11:00 am for hospital follow up with Dr. Prescott Gum NP. Garage code [id number].  Contact information: [address] [id number] mc [place] [address] [phone]         No Known Allergies  Consultations:   cardiology   Other Procedures/Studies: Dg Chest 2 View  Result Date: 07/21/2016 CLINICAL DATA:  56 year old male with CHF. EXAM: CHEST  2 VIEW COMPARISON:  Chest radiograph dated 07/02/2016 FINDINGS:  There is stable moderate cardiomegaly. Mild prominence of the central vasculature. There is opacification of the right lung base likely combination of a small right pleural effusion with associated atelectasis/ infiltrate. The left lung is clear. There is no pneumothorax. Median sternotomy wires and CABG vascular clips noted. No acute osseous pathology. IMPRESSION: 1. Small right pleural effusion with associated right lung base subsegmental atelectasis versus infiltrate. Clinical correlation is recommended. 2. Stable moderate cardiomegaly. Probable mild vascular congestion. No pulmonary edema. Electronically Signed   By: Elgie Collard M.D.   On: 07/21/2016 01:51   Ct Abdomen Pelvis W Contrast  Result Date: 07/11/2016 CLINICAL DATA:  56 year old male with history of nausea and vomiting with generalize abdominal pain and bloating for the past 3 days. Evaluate for potential colitis or acute appendicitis. EXAM: CT ABDOMEN AND PELVIS WITH CONTRAST TECHNIQUE: Multidetector CT imaging of the abdomen and pelvis was performed using the standard protocol following bolus administration of intravenous contrast. CONTRAST:  ISOVUE-300 IOPAMIDOL (ISOVUE-300) INJECTION 61% COMPARISON:  No priors. FINDINGS: Lower chest: Cardiomegaly. Moderate  to large right and small left pleural effusions lying dependently. Median sternotomy wires. Hepatobiliary: No suspicious cystic or solid hepatic lesions. No intra or extrahepatic biliary ductal dilatation. Gallbladder is normal in appearance. Pancreas: No pancreatic mass. No pancreatic ductal dilatation. No pancreatic or peripancreatic fluid or inflammatory changes. Spleen: Unremarkable. Adrenals/Urinary Tract: Bilateral adrenal glands and bilateral kidneys are normal in appearance. There is no hydroureteronephrosis. Urinary bladder is normal in appearance. Stomach/Bowel: The appearance of the stomach is normal. There is no pathologic dilatation of small bowel or colon. Normal  appendix. Vascular/Lymphatic: Aortic atherosclerosis, without evidence of aneurysm or dissection in the abdominal or pelvic vasculature. No lymphadenopathy noted in the abdomen or pelvis. Reproductive: Prostate gland and seminal vesicles are unremarkable in appearance. Other: Small volume of ascites.  No pneumoperitoneum. Musculoskeletal: There are no aggressive appearing lytic or blastic lesions noted in the visualized portions of the skeleton. IMPRESSION: 1. Small volume of ascites. Diffuse body wall edema. Moderate to large right and small left pleural effusions. Findings may suggest a state of anasarca. 2. Normal appendix. 3. No definite imaging findings to suggest colitis at this time. 4. Aortic atherosclerosis. 5. Additional incidental findings, as above. Electronically Signed   By: Trudie Reed M.D.   On: 07/11/2016 12:15   Dg Abd Acute W/chest  Result Date: 07/02/2016 CLINICAL DATA:  Acute generalized abdominal pain. EXAM: DG ABDOMEN ACUTE W/ 1V CHEST COMPARISON:  Radiographs of May 03, 2013. FINDINGS: Stable cardiomegaly. Status post coronary artery bypass graft. Stable mild central pulmonary vascular congestion. No pneumothorax or pleural effusion is noted. There is no evidence of bowel obstruction or ileus. Phleboliths are noted in the pelvis. IMPRESSION: No evidence of bowel obstruction or ileus. Stable cardiomegaly with mild central pulmonary vascular congestion. Electronically Signed   By: Lupita Raider, M.D.   On: 07/02/2016 07:55      TODAY-DAY OF DISCHARGE:  Subjective:   Samad Thon today has no headache,no chest abdominal pain,no new weakness tingling or numbness, feels much better wants to go home today.   Objective:   Blood pressure 114/76, pulse 89, temperature 98.8 F (37.1 C), temperature source Oral, resp. rate 18, height 6' (1.829 m), weight 107.3 kg (236 lb 9.6 oz), SpO2 97 %.  Intake/Output Summary (Last 24 hours) at 07/27/16 1154 Last data filed at 07/27/16  0900  Gross per 24 hour  Intake              840 ml  Output             3300 ml  Net            -2460 ml   Filed Weights   07/25/16 0435 07/26/16 0512 07/27/16 0523  Weight: 113.7 kg (250 lb 11.2 oz) 110.7 kg (244 lb) 107.3 kg (236 lb 9.6 oz)    Exam: Awake Alert, Oriented *3, No new F.N deficits, Normal affect [place].AT,PERRAL Supple Neck,No JVD, No cervical lymphadenopathy appriciated.  Symmetrical Chest wall movement, Good air movement bilaterally, CTAB RRR,No Gallops,Rubs or new Murmurs, No Parasternal Heave +ve B.Sounds, Abd Soft, Non tender, No organomegaly appriciated, No rebound -guarding or rigidity. No Cyanosis, Clubbing or edema, No new Rash or bruise   PERTINENT RADIOLOGIC STUDIES: Dg Chest 2 View  Result Date: 07/21/2016 CLINICAL DATA:  56 year old male with CHF. EXAM: CHEST  2 VIEW COMPARISON:  Chest radiograph dated 07/02/2016 FINDINGS: There is stable moderate cardiomegaly. Mild prominence of the central vasculature. There is opacification of the right lung base likely combination  of a small right pleural effusion with associated atelectasis/ infiltrate. The left lung is clear. There is no pneumothorax. Median sternotomy wires and CABG vascular clips noted. No acute osseous pathology. IMPRESSION: 1. Small right pleural effusion with associated right lung base subsegmental atelectasis versus infiltrate. Clinical correlation is recommended. 2. Stable moderate cardiomegaly. Probable mild vascular congestion. No pulmonary edema. Electronically Signed   By: Elgie Collard M.D.   On: 07/21/2016 01:51   Ct Abdomen Pelvis W Contrast  Result Date: 07/11/2016 CLINICAL DATA:  56 year old male with history of nausea and vomiting with generalize abdominal pain and bloating for the past 3 days. Evaluate for potential colitis or acute appendicitis. EXAM: CT ABDOMEN AND PELVIS WITH CONTRAST TECHNIQUE: Multidetector CT imaging of the abdomen and pelvis was performed using the standard  protocol following bolus administration of intravenous contrast. CONTRAST:  ISOVUE-300 IOPAMIDOL (ISOVUE-300) INJECTION 61% COMPARISON:  No priors. FINDINGS: Lower chest: Cardiomegaly. Moderate to large right and small left pleural effusions lying dependently. Median sternotomy wires. Hepatobiliary: No suspicious cystic or solid hepatic lesions. No intra or extrahepatic biliary ductal dilatation. Gallbladder is normal in appearance. Pancreas: No pancreatic mass. No pancreatic ductal dilatation. No pancreatic or peripancreatic fluid or inflammatory changes. Spleen: Unremarkable. Adrenals/Urinary Tract: Bilateral adrenal glands and bilateral kidneys are normal in appearance. There is no hydroureteronephrosis. Urinary bladder is normal in appearance. Stomach/Bowel: The appearance of the stomach is normal. There is no pathologic dilatation of small bowel or colon. Normal appendix. Vascular/Lymphatic: Aortic atherosclerosis, without evidence of aneurysm or dissection in the abdominal or pelvic vasculature. No lymphadenopathy noted in the abdomen or pelvis. Reproductive: Prostate gland and seminal vesicles are unremarkable in appearance. Other: Small volume of ascites.  No pneumoperitoneum. Musculoskeletal: There are no aggressive appearing lytic or blastic lesions noted in the visualized portions of the skeleton. IMPRESSION: 1. Small volume of ascites. Diffuse body wall edema. Moderate to large right and small left pleural effusions. Findings may suggest a state of anasarca. 2. Normal appendix. 3. No definite imaging findings to suggest colitis at this time. 4. Aortic atherosclerosis. 5. Additional incidental findings, as above. Electronically Signed   By: Trudie Reed M.D.   On: 07/11/2016 12:15   Dg Abd Acute W/chest  Result Date: 07/02/2016 CLINICAL DATA:  Acute generalized abdominal pain. EXAM: DG ABDOMEN ACUTE W/ 1V CHEST COMPARISON:  Radiographs of May 03, 2013. FINDINGS: Stable cardiomegaly.  Status post coronary artery bypass graft. Stable mild central pulmonary vascular congestion. No pneumothorax or pleural effusion is noted. There is no evidence of bowel obstruction or ileus. Phleboliths are noted in the pelvis. IMPRESSION: No evidence of bowel obstruction or ileus. Stable cardiomegaly with mild central pulmonary vascular congestion. Electronically Signed   By: Lupita Raider, M.D.   On: 07/02/2016 07:55     PERTINENT LAB RESULTS: CBC:  Recent Labs  07/26/16 1618 07/27/16 0159  WBC 7.6 8.4  HGB 12.8* 12.4*  HCT 42.0 41.5  PLT 285 348   CMET CMP     Component Value Date/Time   NA 135 07/27/2016 0159   NA 137 04/07/2013 0136   K 4.0 07/27/2016 0159   K 3.8 04/07/2013 0136   CL 99 (L) 07/27/2016 0159   CL 105 04/07/2013 0136   CO2 28 07/27/2016 0159   CO2 28 04/07/2013 0136   GLUCOSE 92 07/27/2016 0159   GLUCOSE 133 (H) 04/07/2013 0136   BUN 17 07/27/2016 0159   BUN 18 04/07/2013 0136   CREATININE 1.24 07/27/2016 0159  CREATININE 1.17 04/07/2013 0136   CALCIUM 8.7 (L) 07/27/2016 0159   CALCIUM 9.3 04/07/2013 0136   PROT 5.0 (L) 07/22/2016 0309   PROT 8.0 04/07/2013 0136   ALBUMIN 2.7 (L) 07/22/2016 0309   ALBUMIN 4.2 04/07/2013 0136   AST 21 07/22/2016 0309   AST 36 04/07/2013 0136   ALT 17 07/22/2016 0309   ALT 31 04/07/2013 0136   ALKPHOS 77 07/22/2016 0309   ALKPHOS 75 04/07/2013 0136   BILITOT 1.0 07/22/2016 0309   BILITOT 0.6 04/07/2013 0136   GFRNONAA >60 07/27/2016 0159   GFRNONAA >60 04/07/2013 0136   GFRAA >60 07/27/2016 0159   GFRAA >60 04/07/2013 0136    GFR Estimated Creatinine Clearance: 84.2 mL/min (by C-G formula based on SCr of 1.24 mg/dL). No results for input(s): LIPASE, AMYLASE in the last 72 hours. No results for input(s): CKTOTAL, CKMB, CKMBINDEX, TROPONINI in the last 72 hours. Invalid input(s): POCBNP No results for input(s): DDIMER in the last 72 hours. No results for input(s): HGBA1C in the last 72 hours. No results  for input(s): CHOL, HDL, LDLCALC, TRIG, CHOLHDL, LDLDIRECT in the last 72 hours. No results for input(s): TSH, T4TOTAL, T3FREE, THYROIDAB in the last 72 hours.  Invalid input(s): FREET3 No results for input(s): VITAMINB12, FOLATE, FERRITIN, TIBC, IRON, RETICCTPCT in the last 72 hours. Coags:  Recent Labs  07/25/16 1210  INR 1.23   Microbiology: No results found for this or any previous visit (from the past 240 hour(s)).  FURTHER DISCHARGE INSTRUCTIONS:  Get Medicines reviewed and adjusted: Please take all your medications with you for your next visit with your Primary MD  Laboratory/radiological data: Please request your Primary MD to go over all hospital tests and procedure/radiological results at the follow up, please ask your Primary MD to get all Hospital records sent to his/her office.  In some cases, they will be blood work, cultures and biopsy results pending at the time of your discharge. Please request that your primary care M.D. goes through all the records of your hospital data and follows up on these results.  Also Note the following: If you experience worsening of your admission symptoms, develop shortness of breath, life threatening emergency, suicidal or homicidal thoughts you must seek medical attention immediately by calling 911 or calling your MD immediately  if symptoms less severe.  You must read complete instructions/literature along with all the possible adverse reactions/side effects for all the Medicines you take and that have been prescribed to you. Take any new Medicines after you have completely understood and accpet all the possible adverse reactions/side effects.   Do not drive when taking Pain medications or sleeping medications (Benzodaizepines)  Do not take more than prescribed Pain, Sleep and Anxiety Medications. It is not advisable to combine anxiety,sleep and pain medications without talking with your primary care practitioner  Special  Instructions: If you have smoked or chewed Tobacco  in the last 2 yrs please stop smoking, stop any regular Alcohol  and or any Recreational drug use.  Wear Seat belts while driving.  Please note: You were cared for by a hospitalist during your hospital stay. Once you are discharged, your primary care physician will handle any further medical issues. Please note that NO REFILLS for any discharge medications will be authorized once you are discharged, as it is imperative that you return to your primary care physician (or establish a relationship with a primary care physician if you do not have one) for your post hospital discharge needs  so that they can reassess your need for medications and monitor your lab values.  Total Time spent coordinating discharge including counseling, education and face to face time equals  45 minutes.  SignedJeoffrey Massed 07/27/2016 11:54 AM

## 2016-07-28 ENCOUNTER — Telehealth: Payer: Self-pay | Admitting: *Deleted

## 2016-07-29 ENCOUNTER — Encounter (HOSPITAL_COMMUNITY): Payer: Self-pay | Admitting: Internal Medicine

## 2016-07-29 NOTE — Telephone Encounter (Signed)
Contacted Dr. Ardeen Garland office and ask if these could be faxed again. Gave triage fax number this time.

## 2016-07-30 NOTE — Telephone Encounter (Signed)
Lab results were not found in [facility]- folder up front, cubby, or in Joshua [name]'s folders.   Called Dr. Gerilyn Pilgrim office, automated message states that their office is closed d/t a death in the family and will be closed through tomorrow.  [facility] Thursday (5/10) to re-request labs.

## 2016-08-02 NOTE — Telephone Encounter (Signed)
I have attempted to contact Dr. Gerilyn Pilgrim office, and received a message stating on Friday, May 11 their office will be closing at 12:00, due to a [employer]. [facility] on Monday 5/14.

## 2016-08-05 ENCOUNTER — Telehealth (HOSPITAL_COMMUNITY): Payer: Self-pay | Admitting: Cardiology

## 2016-08-05 ENCOUNTER — Encounter (HOSPITAL_COMMUNITY): Payer: Self-pay

## 2016-08-05 ENCOUNTER — Telehealth (HOSPITAL_COMMUNITY): Payer: Self-pay | Admitting: Pharmacist

## 2016-08-05 ENCOUNTER — Ambulatory Visit (HOSPITAL_COMMUNITY)
Admission: RE | Admit: 2016-08-05 | Discharge: 2016-08-05 | Disposition: A | Discharge status: Home / Self Care | Payer: BC Managed Care – PPO | Source: Ambulatory Visit | Attending: Cardiology | Admitting: Cardiology

## 2016-08-05 VITALS — BP 158/98 | HR 79 | Wt 246.2 lb

## 2016-08-05 DIAGNOSIS — Z951 Presence of aortocoronary bypass graft: Secondary | ICD-10-CM | POA: Insufficient documentation

## 2016-08-05 DIAGNOSIS — R0683 Snoring: Secondary | ICD-10-CM | POA: Insufficient documentation

## 2016-08-05 DIAGNOSIS — Z6833 Body mass index (BMI) 33.0-33.9, adult: Secondary | ICD-10-CM | POA: Diagnosis not present

## 2016-08-05 DIAGNOSIS — E669 Obesity, unspecified: Secondary | ICD-10-CM | POA: Diagnosis not present

## 2016-08-05 DIAGNOSIS — I5022 Chronic systolic (congestive) heart failure: Secondary | ICD-10-CM

## 2016-08-05 DIAGNOSIS — I252 Old myocardial infarction: Secondary | ICD-10-CM | POA: Insufficient documentation

## 2016-08-05 DIAGNOSIS — I110 Hypertensive heart disease with heart failure: Secondary | ICD-10-CM | POA: Insufficient documentation

## 2016-08-05 DIAGNOSIS — G8929 Other chronic pain: Secondary | ICD-10-CM | POA: Insufficient documentation

## 2016-08-05 DIAGNOSIS — M549 Dorsalgia, unspecified: Secondary | ICD-10-CM | POA: Insufficient documentation

## 2016-08-05 DIAGNOSIS — K219 Gastro-esophageal reflux disease without esophagitis: Secondary | ICD-10-CM | POA: Diagnosis not present

## 2016-08-05 DIAGNOSIS — Z955 Presence of coronary angioplasty implant and graft: Secondary | ICD-10-CM | POA: Insufficient documentation

## 2016-08-05 DIAGNOSIS — R252 Cramp and spasm: Secondary | ICD-10-CM | POA: Insufficient documentation

## 2016-08-05 DIAGNOSIS — I5042 Chronic combined systolic (congestive) and diastolic (congestive) heart failure: Secondary | ICD-10-CM | POA: Insufficient documentation

## 2016-08-05 DIAGNOSIS — I509 Heart failure, unspecified: Secondary | ICD-10-CM

## 2016-08-05 DIAGNOSIS — M19019 Primary osteoarthritis, unspecified shoulder: Secondary | ICD-10-CM | POA: Insufficient documentation

## 2016-08-05 DIAGNOSIS — Z8249 Family history of ischemic heart disease and other diseases of the circulatory system: Secondary | ICD-10-CM | POA: Insufficient documentation

## 2016-08-05 DIAGNOSIS — I10 Essential (primary) hypertension: Secondary | ICD-10-CM

## 2016-08-05 DIAGNOSIS — Z87442 Personal history of urinary calculi: Secondary | ICD-10-CM | POA: Insufficient documentation

## 2016-08-05 DIAGNOSIS — I2510 Atherosclerotic heart disease of native coronary artery without angina pectoris: Secondary | ICD-10-CM | POA: Diagnosis not present

## 2016-08-05 DIAGNOSIS — E876 Hypokalemia: Secondary | ICD-10-CM | POA: Diagnosis not present

## 2016-08-05 DIAGNOSIS — Z79899 Other long term (current) drug therapy: Secondary | ICD-10-CM | POA: Diagnosis not present

## 2016-08-05 DIAGNOSIS — Z7982 Long term (current) use of aspirin: Secondary | ICD-10-CM | POA: Insufficient documentation

## 2016-08-05 DIAGNOSIS — E6609 Other obesity due to excess calories: Secondary | ICD-10-CM

## 2016-08-05 LAB — BASIC METABOLIC PANEL
ANION GAP: 10 (ref 5–15)
BUN: 27 mg/dL — ABNORMAL HIGH (ref 6–20)
CHLORIDE: 105 mmol/L (ref 101–111)
CO2: 19 mmol/L — ABNORMAL LOW (ref 22–32)
Calcium: 9.4 mg/dL (ref 8.9–10.3)
Creatinine, Ser: 1.03 mg/dL (ref 0.61–1.24)
Glucose, Bld: 120 mg/dL — ABNORMAL HIGH (ref 65–99)
POTASSIUM: 5.6 mmol/L — AB (ref 3.5–5.1)
SODIUM: 134 mmol/L — AB (ref 135–145)

## 2016-08-05 LAB — MAGNESIUM: MAGNESIUM: 2 mg/dL (ref 1.7–2.4)

## 2016-08-05 LAB — BRAIN NATRIURETIC PEPTIDE: B NATRIURETIC PEPTIDE 5: 565.5 pg/mL — AB (ref 0.0–100.0)

## 2016-08-05 MED ORDER — ASPIRIN 81 MG PO TABS: 81.0000 mg | ORAL_TABLET | Freq: Every day | ORAL | 3 refills | Status: DC

## 2016-08-05 MED ORDER — SPIRONOLACTONE 25 MG PO TABS: 25.0000 mg | ORAL_TABLET | Freq: Every day | ORAL | 3 refills | Status: DC

## 2016-08-05 MED ORDER — CARVEDILOL 3.125 MG PO TABS: 3.1250 mg | ORAL_TABLET | Freq: Two times a day (BID) | ORAL | 3 refills | Status: DC

## 2016-08-05 MED ORDER — ATORVASTATIN CALCIUM 40 MG PO TABS: 40.0000 mg | ORAL_TABLET | Freq: Every day | ORAL | 3 refills | Status: DC

## 2016-08-05 MED ORDER — FUROSEMIDE 40 MG PO TABS: 40.0000 mg | ORAL_TABLET | Freq: Every day | ORAL | 3 refills | Status: DC

## 2016-08-05 MED ORDER — POTASSIUM CHLORIDE CRYS ER 20 MEQ PO TBCR: 20.0000 meq | EXTENDED_RELEASE_TABLET | Freq: Every day | ORAL | 3 refills | Status: DC

## 2016-08-05 NOTE — Telephone Encounter (Signed)
Entresto 49-51 mg BID PA approved by [facility] through 08/05/17.   Tyler Deis. Bonnye Fava, PharmD, BCPS, CPP Clinical Pharmacist Pager: [phone] Phone: [phone] 08/05/2016 4:01 PM

## 2016-08-05 NOTE — Telephone Encounter (Signed)
Pt aware. Repeat labs 5/18

## 2016-08-05 NOTE — Progress Notes (Signed)
Advanced Heart Failure Clinic Note   Primary Care: Starts with [place] care Dr. Adriana Simas in [facility] Primary Cardiologist: Dr. Gala Romney (Previously Dr. Lady Gary)  HPI:  Joshua [name] is a 56 y.o. 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   Pt had not followed up with cardiologist for several years.  Admitted to [facility] 07/21/16 to 07/27/16 with worsening SOB. Cath and echo as below.  EF dropped from previous. Grafts patent and hemodynamics compensated after diuresis.  Diuresed >50 lbs total.   Presents today for post hospital follow up.  Has been feeling OK from a heart perspective since discharge. Back has been bothering him a lot this week. Worse in the morning and gets better throughout the day. Has been able to walk about a mile a day though.  Denies SOB on flat ground. Weight shows up 10 lbs since left the hospital. Denies orthopnea and PND.  Denies edema. 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 Wants to go back to work.  Taking all medication as directed.   Echo 07/21/16  LVEF 20-25%, mild LAE, Mod RV dilation, Mild RAE.    L/RHC 07/26/16    -     1st Mrg lesion, 100 %stenosed.  2nd Mrg lesion, 100 %stenosed.  Acute Mrg lesion, 90 %stenosed.  Mid LAD-1 lesion, 90 %stenosed.  Mid LAD-2 lesion, 95 %stenosed.  Mid Cx lesion, 40 %stenosed.  Prox RCA lesion, 95 %stenosed.  Dist RCA lesion, 90 %stenosed.    Findings: Ao = 96/63 (77) LV = 98/18 RA = 8 RV = 42/10 PA = 39/19 (26) PCW = 14 Fick cardiac output/index = 6.6/2.8 PVR = FA sat = 96% PA sat = 66%, 67%   Past Medical History:  Diagnosis Date  . Acid reflux   . Anasarca   . Arthritis    SHOULDERS  . CHF (congestive heart failure) (HCC)   . Coronary artery disease 04/07/2013   cath by Dr. Lady Gary at [facility]  . Essential hypertension, benign 04/06/2013  . Hypertension   . Kidney stone   . Non-STEMI (non-ST elevated myocardial infarction) (HCC) 04/06/2013    . NSTEMI (non-ST elevated myocardial infarction) (HCC)   . Obesity, unspecified 04/07/2013  . 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   LIMA to LAD, SVG to OM2, SVG to OM3, SVG to PDA, EVH via right thigh and bilateral lower legs    Current Outpatient Prescriptions  Medication Sig Dispense Refill  . aspirin 81 MG tablet Take 1 tablet (81 mg total) by mouth daily. 30 tablet 5  . atorvastatin (LIPITOR) 40 MG tablet Take 1 tablet (40 mg total) by mouth daily at 6 PM. 30 tablet 0  . carvedilol (COREG) 3.125 MG tablet Take 1 tablet (3.125 mg total) by mouth 2 (two) times daily with a meal. 60 tablet 0  . furosemide (LASIX) 40 MG tablet Take 1 tablet (40 mg total) by mouth daily. 30 tablet 0  . potassium chloride SA (K-DUR,KLOR-CON) 20 MEQ tablet Take 1 tablet (20 mEq total) by mouth daily. 30 tablet 0  . sacubitril-valsartan (ENTRESTO) 49-51 MG Take 1 tablet by mouth 2 (two) times daily. 60 tablet 0  . spironolactone (ALDACTONE) 25 MG tablet Take 0.5 tablets (12.5 mg total) by mouth daily. 30 tablet 0   No current facility-administered medications for this encounter.     No Known Allergies    Social History   Social History  . Marital status: Single  Spouse name: N/A  . Number of children: N/A  . Years of education: N/A   Occupational History  . house remodeler      currently out of work  .  [employer]   Social History Main Topics  . Smoking status: Never Smoker  . Smokeless tobacco: Never Used     Comment: iI  DIPPED TOBACCO AT ONE TOME"  . Alcohol use No  . Drug use: No  . Sexual activity: Not on file   Other Topics Concern  . 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 Cancer Mother   . Coronary artery disease Father   . Hypertension Father   . Heart attack Paternal Uncle     Vitals:   08/05/16 1056  BP: (!) 158/98  Pulse: 79  SpO2:  96%  Weight: 246 lb 3.2 oz (111.7 kg)   Wt Readings from Last 3 Encounters:  08/05/16 246 lb 3.2 oz (111.7 kg)  07/27/16 236 lb 9.6 oz (107.3 kg)  07/18/16 289 lb 9.6 oz (131.4 kg)     PHYSICAL EXAM: General:  Well appearing. No respiratory difficulty HEENT: normal Neck: supple. JVP 7-8 cm. Carotids 2+ bilat; no bruits. No lymphadenopathy or thyromegaly appreciated. Cor: PMI nondisplaced. Regular rate & rhythm. No rubs, gallops or murmurs. Lungs: clear Abdomen: soft, nontender, nondistended. No hepatosplenomegaly. No bruits or masses. Good bowel sounds. Extremities: no cyanosis, clubbing, rash, edema Neuro: alert & oriented x 3, cranial nerves grossly intact. moves all 4 extremities w/o difficulty. Affect pleasant.  ASSESSMENT & PLAN:  1. 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  - Continue lasix 40 mg daily - Continue coreg 3.125 mg BID - Continue Entresto 49/51 - Increase spiro to 25 mg daily. BMET today.  - Reinforced fluid restriction to < 2 L daily, sodium restriction to less than 2000 mg daily, and the importance of daily weights.   2. CAD s/p CABG - Denies CP. Continue statin and ASA. 3. Snoring - Refer for sleep study.  4. Hypokalemia - BMET today. Increase spiro as above.  5. Chronic back pain - Per PCP. Continue tylenol prn.  6. Cramps - Check electrolytes as above.   Meds and labs as above. Follow up 3 weeks with Pharm-D and 6 weeks with MD. He only has 2-3 weeks of school left. I have asked that he stay out and continue to rehab from recent hospitalization. Consider cardiac rehab at next visit.   Graciella Freer, PA-C 08/05/16  Greater than 50% of the 25 minute visit was spent in counseling/coordination of care regarding disease state education, sleep study, medication reconciliation, and salt/fluid restriction.

## 2016-08-05 NOTE — Addendum Note (Signed)
Encounter addended by: Graciella Freer, PA-C on: 08/05/2016  1:58 PM<BR>    Actions taken: Result note filed

## 2016-08-05 NOTE — Progress Notes (Signed)
Letter written per Otilio Saber VO to keep patient out of work until  Follow up with Dr. Gala Romney.  Ave Filter, RN

## 2016-08-05 NOTE — Patient Instructions (Signed)
Will schedule you for sleep study at [facility]. Sleep Disorders Center at [facility] [address] [place], [place] [address]   ___________________________________________________  ___________________________________________________  Routine lab work today. Will notify you of abnormal results, otherwise no news is good news!  All medications refilled for 90 day supply to [facility] in [place].  INCREASE Spironolactone to 25 mg (1 whole tablet) once daily.  Use Entresto samples and copay card until seen by Elizabeth Palau CHF clinical pharmacist. Follow up with CHF clinical pharmacist Elizabeth Palau in 3 weeks to discuss Entresto.  Remain out of work until follow up with Dr. Gala Romney. Follow up with Dr. Gala Romney in 6 weeks.  Do the following things EVERYDAY: 1) Weigh yourself in the morning before breakfast. Write it down and keep it in a log. 2) Take your medicines as prescribed 3) Eat low salt foods-Limit salt (sodium) to 2000 mg per day.  4) Stay as active as you can everyday 5) Limit all fluids for the day to less than 2 liters

## 2016-08-05 NOTE — Telephone Encounter (Signed)
-----   Message from Graciella Freer, PA-C sent at 08/05/2016  1:57 PM EDT ----- Stop potassium.   Avoid bananas.    Recheck BMET Friday.  Thanks!   Casimiro Needle [address] [place], PA-C 08/05/2016 1:57 PM

## 2016-08-06 ENCOUNTER — Telehealth: Payer: Self-pay | Admitting: Licensed Clinical Social Worker

## 2016-08-06 NOTE — Telephone Encounter (Signed)
Called Dr Gerilyn Pilgrim' office and spoke with [name] and asked her to fax labs  I gave her the triage fax- will await records

## 2016-08-06 NOTE — Telephone Encounter (Signed)
Labs have been received and placed in VS cubby for review.  Will route to [place] to f/u on.  Marie with [employer] is aware.  Nothing further needed.

## 2016-08-06 NOTE — Telephone Encounter (Signed)
CSW referred to assist with financial assistance. CSW spoke with patient via phone who reports he has been out of work due to HF. 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 Patient states that his co workers have donated PAL time to help defray the costs of his insurance and friends have been assisting with utility and food bills. Patient reports his house is paid for so that has been a blessing. Patient states "I am getting along but could change at any time". Patient has a follow up appointment on June 27th and hopeful to return to work at that time. Patient states if he continues to be out of work after that time he will consider applying for disability. CSW offered support and encouraged patient to return call if financial burden continues for additional resources to support patient. Patient verbalizes understanding of follow up and potential resources. CSW available as needed. Lasandra Beech, LCSW, CCSW-MCS [phone]

## 2016-08-06 NOTE — Telephone Encounter (Signed)
[name] calling back stating she has faxed over labs, please call once received, [phone].

## 2016-08-07 NOTE — Telephone Encounter (Signed)
Will send to VS as FYI to verify these were received.

## 2016-08-09 ENCOUNTER — Ambulatory Visit (HOSPITAL_COMMUNITY)
Admission: RE | Admit: 2016-08-09 | Discharge: 2016-08-09 | Disposition: A | Discharge status: Home / Self Care | Payer: BC Managed Care – PPO | Source: Ambulatory Visit | Attending: Cardiology | Admitting: Cardiology

## 2016-08-09 ENCOUNTER — Telehealth: Payer: Self-pay | Admitting: Pulmonary Disease

## 2016-08-09 ENCOUNTER — Encounter (HOSPITAL_COMMUNITY): Payer: Self-pay

## 2016-08-09 DIAGNOSIS — I509 Heart failure, unspecified: Secondary | ICD-10-CM

## 2016-08-09 DIAGNOSIS — I5022 Chronic systolic (congestive) heart failure: Secondary | ICD-10-CM | POA: Diagnosis not present

## 2016-08-09 LAB — BASIC METABOLIC PANEL
Anion gap: 6 (ref 5–15)
BUN: 32 mg/dL — ABNORMAL HIGH (ref 6–20)
CALCIUM: 9 mg/dL (ref 8.9–10.3)
CO2: 25 mmol/L (ref 22–32)
Chloride: 104 mmol/L (ref 101–111)
Creatinine, Ser: 1 mg/dL (ref 0.61–1.24)
Glucose, Bld: 112 mg/dL — ABNORMAL HIGH (ref 65–99)
Potassium: 5 mmol/L (ref 3.5–5.1)
SODIUM: 135 mmol/L (ref 135–145)

## 2016-08-09 NOTE — Telephone Encounter (Signed)
Noted  

## 2016-08-09 NOTE — Progress Notes (Signed)
Study study referral form completed and faxed to [facility]. Copy of form scanned into patient's electronic medical record.  Ave Filter, RN

## 2016-08-09 NOTE — Telephone Encounter (Signed)
BMP Latest Ref Rng & Units 08/09/2016 08/05/2016 07/27/2016  Glucose 65 - 99 mg/dL 756(E) 332(R) 92  BUN 6 - 20 mg/dL 51(O) 84(Z) 17  Creatinine 0.61 - 1.24 mg/dL 6.60 6.30 1.60  Sodium 135 - 145 mmol/L 135 134(L) 135  Potassium 3.5 - 5.1 mmol/L 5.0 5.6(H) 4.0  Chloride 101 - 111 mmol/L 104 105 99(L)  CO2 22 - 32 mmol/L 25 19(L) 28  Calcium 8.9 - 10.3 mg/dL 9.0 9.4 1.0(X)    Will have my nurse inform pt that lab test was okay.

## 2016-08-13 NOTE — Telephone Encounter (Signed)
Results have been explained to patient, pt expressed understanding. Nothing further needed.  

## 2016-08-23 ENCOUNTER — Ambulatory Visit: Payer: BC Managed Care – PPO | Admitting: Family Medicine

## 2016-09-02 ENCOUNTER — Telehealth (HOSPITAL_COMMUNITY): Payer: Self-pay | Admitting: Pharmacist

## 2016-09-02 ENCOUNTER — Ambulatory Visit (HOSPITAL_COMMUNITY)
Admission: RE | Admit: 2016-09-02 | Discharge: 2016-09-02 | Disposition: A | Discharge status: Home / Self Care | Payer: BC Managed Care – PPO | Source: Ambulatory Visit | Attending: Cardiology | Admitting: Cardiology

## 2016-09-02 ENCOUNTER — Encounter (HOSPITAL_COMMUNITY): Payer: Self-pay

## 2016-09-02 VITALS — BP 138/96 | HR 75 | Wt 254.0 lb

## 2016-09-02 DIAGNOSIS — G8929 Other chronic pain: Secondary | ICD-10-CM | POA: Insufficient documentation

## 2016-09-02 DIAGNOSIS — Z951 Presence of aortocoronary bypass graft: Secondary | ICD-10-CM | POA: Insufficient documentation

## 2016-09-02 DIAGNOSIS — I5022 Chronic systolic (congestive) heart failure: Secondary | ICD-10-CM | POA: Diagnosis present

## 2016-09-02 DIAGNOSIS — R0683 Snoring: Secondary | ICD-10-CM | POA: Insufficient documentation

## 2016-09-02 DIAGNOSIS — R252 Cramp and spasm: Secondary | ICD-10-CM | POA: Diagnosis not present

## 2016-09-02 DIAGNOSIS — M549 Dorsalgia, unspecified: Secondary | ICD-10-CM | POA: Insufficient documentation

## 2016-09-02 DIAGNOSIS — I110 Hypertensive heart disease with heart failure: Secondary | ICD-10-CM | POA: Diagnosis not present

## 2016-09-02 DIAGNOSIS — E876 Hypokalemia: Secondary | ICD-10-CM | POA: Diagnosis not present

## 2016-09-02 DIAGNOSIS — I2510 Atherosclerotic heart disease of native coronary artery without angina pectoris: Secondary | ICD-10-CM | POA: Diagnosis not present

## 2016-09-02 DIAGNOSIS — I5042 Chronic combined systolic (congestive) and diastolic (congestive) heart failure: Secondary | ICD-10-CM

## 2016-09-02 DIAGNOSIS — E875 Hyperkalemia: Secondary | ICD-10-CM | POA: Insufficient documentation

## 2016-09-02 LAB — BASIC METABOLIC PANEL
ANION GAP: 6 (ref 5–15)
BUN: 27 mg/dL — ABNORMAL HIGH (ref 6–20)
CALCIUM: 9.3 mg/dL (ref 8.9–10.3)
CO2: 25 mmol/L (ref 22–32)
Chloride: 103 mmol/L (ref 101–111)
Creatinine, Ser: 1.2 mg/dL (ref 0.61–1.24)
Glucose, Bld: 96 mg/dL (ref 65–99)
POTASSIUM: 5.5 mmol/L — AB (ref 3.5–5.1)
SODIUM: 134 mmol/L — AB (ref 135–145)

## 2016-09-02 MED ORDER — SPIRONOLACTONE 25 MG PO TABS: 12.5000 mg | ORAL_TABLET | Freq: Every day | ORAL | 3 refills | Status: DC

## 2016-09-02 MED ORDER — SACUBITRIL-VALSARTAN 49-51 MG PO TABS: 1.0000 | ORAL_TABLET | Freq: Two times a day (BID) | ORAL | 3 refills | Status: DC

## 2016-09-02 MED ORDER — SACUBITRIL-VALSARTAN 97-103 MG PO TABS: 1.0000 | ORAL_TABLET | Freq: Two times a day (BID) | ORAL | 11 refills | Status: DC

## 2016-09-02 NOTE — Telephone Encounter (Signed)
BMet today came back with K 5.5. Called patient to inform that he should not increase entresto (keep at 49-51 mg BID) and to cut spironolactone in 1/2 (new dose reduced to 12.5 mg daily). Asked him to avoid potassium-containing foods including tomatoes. Recheck BMet in 1 week. Will fill out [place] paperwork at that time.   Allena Katz, Pharm.D. PGY1 Pharmacy Resident 6/11/20184:33 PM Pager [phone]

## 2016-09-02 NOTE — Progress Notes (Signed)
HF MD: Gala Romney  HPI:  Joshua [name] is a 56 y.o. Caucasian male with PMH of HTN, CAD s/p CABG 03/2013 with patent stents 07/2016, and chronic systolic CHF due to ICM with EF 20-25% (06/2016). Previously pt had not seen cardiologist in several years. Was admitted to [facility] 07/21/16 to 07/27/16 with worsening SOB. Cath and echo as below. EF dropped from previous. Grafts patent and hemodynamics compensated after diuresis. Diuresed >50 lbs total.   Presents today for pharmacist led HF medication optimization. At last HF clinic visit on 5/14, spironolactone was increased to 25 mg daily. Has been feeling OK from a heart perspective since discharge, but does report some bendopnea. Has been able to walk about a mile a day though. Reports eating a low sodium diet, and reports eating around 2 tomatoes a day.   Denies SOB on flat ground. Weight shows up 10 lbs, patient attributes to increased milk intake this weekend. Denies orthopnea and PND.  Denies edema, negative edema on physical exam. Taking all medication as directed.   Echo 07/21/16  LVEF 20-25%, mild LAE, Mod RV dilation, Mild RAE.   Marland Kitchen Shortness of breath/dyspnea on exertion? No - walking a mile a day . Orthopnea/PND? No  . Edema? No  . Lightheadedness/dizziness? Yes - happening within the last couple of weeks, positional changes but no orthostasis . Daily weights at home? Yes - weight at home 238-242 and now 252 . Blood pressure/heart rate monitoring at home? No  . Following low-sodium/fluid-restricted diet? Yes   HF Medications: Coreg 3.125mg  twice a day Entresto 49/51 twice a day Spironolactone 25mg  daily Lasix 40mg  daily  Has the patient been experiencing any side effects to the medications prescribed?  Yes - dizziness and fatigue  Does the patient have any problems obtaining medications due to transportation or finances?   No - BCBS commercial   Understanding of regimen: good Understanding of indications: good Potential of compliance:  good Patient understands to avoid NSAIDs. Patient understands to avoid decongestants.  Pertinent Lab Values: . 6/11: Serum creatinine 1.20 (BL ~1), CO2 25, Potassium 5.5, Sodium 134 . 5/14: BNP 565, Magnesium 2.0  Vital Signs: . Weight: 254 (dry weight: 245) - thinks weight gain is related to recent increased milk consumption . Blood pressure: 148/88 mmHg  . Heart rate: 74 bpm  . Vest reading: 39% - large chest   Assessment: 1. Chronicsystolic CHF (EF 16-10% by echo 07/2016), due to ICM. NYHA class I-IIsymptoms. - Volume status slightly elevated on exam  - BMET today. K was 5.5 - With elevation in K level, will continue Entresto at 49-51 mg BID and reduce spironolactone to 12.5 mg daily - Continue lasix 40 mg daily and coreg 3.125 mg BID - Educated on high K foods, such as tomatoes and potatoes, and counseled to consume less.  - Reinforced fluid restriction to < 2 L daily, sodium restriction to less than 2000 mg daily, and the importance of daily weights.  - Basic disease state pathophysiology, medication indication, mechanism and side effects reviewed at length with patient and he verbalized understanding 2. CAD s/p CABG - Denies CP. Continue statin and ASA. 3. Hypokalemia >> Hyperkalemia  - BMET today. K 5.5.  - Decreased spiro to 12.5 mg QD.  4. Snoring - Refer for sleep study.  5. Chronic back pain - Per PCP. Continue tylenol prn.  6. Cramps - resolved, BMET today  Plan: 1) Medication changes: Based on clinical presentation, vital signs and recent labs will decrease spironolactone 12.5  mg QD.  2) Labs: BMET today and repeat in 1 week  3) Follow-up: BMET in 1 week. Consider filling out paperwork for [place] if elevated K in repeat BMET.  Dr. Gala Romney on 6/27   Marlinda Mike, PharmD Candidate  Agree with above.  Tyler Deis. Bonnye Fava, PharmD, BCPS, CPP Clinical Pharmacist Pager: [phone] Phone: [phone] 09/02/2016 1:18 PM  Agree with  above.  Arvilla Meres, MD  6:43 PM

## 2016-09-02 NOTE — Patient Instructions (Addendum)
Thanks for coming in, it was great to see you today.  Good to hear about your exercise and diet improvements - keep up the good work!  We are drawing blood to do labs today. We will call you if there are any issues.    Increase Entresto to 97-103 mg twice a day. Do this by taking two tablets twice a day of your sample supply that you have at home. When you run out of this, go to the pharmacy to pick up the higher dose tablets and return to taking one tablet twice a day.  Don't forget to use your copay card - this should make your [name] cost no more than $10 per month.  Call the number on the pharmacist's card if there are any issues at your pharmacy.  Continue your lasix 40 mg daily, carvedilol 3.125 mg twice a day, spironolactone 25 mg daily.   You will see Dr. Gala Romney on 09/18/16.

## 2016-09-09 ENCOUNTER — Ambulatory Visit (HOSPITAL_COMMUNITY)
Admission: RE | Admit: 2016-09-09 | Discharge: 2016-09-09 | Disposition: A | Discharge status: Home / Self Care | Payer: BC Managed Care – PPO | Source: Ambulatory Visit | Attending: Cardiology | Admitting: Cardiology

## 2016-09-09 DIAGNOSIS — I5042 Chronic combined systolic (congestive) and diastolic (congestive) heart failure: Secondary | ICD-10-CM

## 2016-09-09 LAB — MAGNESIUM: Magnesium: 2 mg/dL (ref 1.7–2.4)

## 2016-09-09 LAB — BASIC METABOLIC PANEL
Anion gap: 7 (ref 5–15)
BUN: 25 mg/dL — AB (ref 6–20)
CHLORIDE: 104 mmol/L (ref 101–111)
CO2: 26 mmol/L (ref 22–32)
Calcium: 9.1 mg/dL (ref 8.9–10.3)
Creatinine, Ser: 1.14 mg/dL (ref 0.61–1.24)
GFR calc Af Amer: >60 mL/min (ref >60)
GFR calc non Af Amer: >60 mL/min (ref >60)
Glucose, Bld: 122 mg/dL — ABNORMAL HIGH (ref 65–99)
POTASSIUM: 4.9 mmol/L (ref 3.5–5.1)
Sodium: 137 mmol/L (ref 135–145)

## 2016-09-16 ENCOUNTER — Ambulatory Visit: Payer: BC Managed Care – PPO | Admitting: Family Medicine

## 2016-09-18 ENCOUNTER — Encounter (HOSPITAL_COMMUNITY): Payer: Self-pay | Admitting: *Deleted

## 2016-09-18 ENCOUNTER — Ambulatory Visit (HOSPITAL_COMMUNITY)
Admission: RE | Admit: 2016-09-18 | Discharge: 2016-09-18 | Disposition: A | Discharge status: Home / Self Care | Payer: BC Managed Care – PPO | Source: Ambulatory Visit | Attending: Internal Medicine | Admitting: Internal Medicine

## 2016-09-18 ENCOUNTER — Encounter (HOSPITAL_COMMUNITY): Payer: Self-pay | Admitting: Internal Medicine

## 2016-09-18 VITALS — BP 150/90 | HR 74 | Wt 252.5 lb

## 2016-09-18 DIAGNOSIS — Z951 Presence of aortocoronary bypass graft: Secondary | ICD-10-CM | POA: Diagnosis not present

## 2016-09-18 DIAGNOSIS — I5042 Chronic combined systolic (congestive) and diastolic (congestive) heart failure: Secondary | ICD-10-CM | POA: Insufficient documentation

## 2016-09-18 DIAGNOSIS — I2510 Atherosclerotic heart disease of native coronary artery without angina pectoris: Secondary | ICD-10-CM | POA: Diagnosis not present

## 2016-09-18 DIAGNOSIS — M549 Dorsalgia, unspecified: Secondary | ICD-10-CM | POA: Diagnosis not present

## 2016-09-18 DIAGNOSIS — I255 Ischemic cardiomyopathy: Secondary | ICD-10-CM | POA: Insufficient documentation

## 2016-09-18 DIAGNOSIS — R0683 Snoring: Secondary | ICD-10-CM | POA: Diagnosis not present

## 2016-09-18 DIAGNOSIS — I110 Hypertensive heart disease with heart failure: Secondary | ICD-10-CM | POA: Diagnosis not present

## 2016-09-18 DIAGNOSIS — E875 Hyperkalemia: Secondary | ICD-10-CM | POA: Insufficient documentation

## 2016-09-18 DIAGNOSIS — Z79899 Other long term (current) drug therapy: Secondary | ICD-10-CM | POA: Insufficient documentation

## 2016-09-18 DIAGNOSIS — I10 Essential (primary) hypertension: Secondary | ICD-10-CM | POA: Diagnosis not present

## 2016-09-18 DIAGNOSIS — Z7982 Long term (current) use of aspirin: Secondary | ICD-10-CM | POA: Diagnosis not present

## 2016-09-18 DIAGNOSIS — I5022 Chronic systolic (congestive) heart failure: Secondary | ICD-10-CM | POA: Diagnosis not present

## 2016-09-18 DIAGNOSIS — G8929 Other chronic pain: Secondary | ICD-10-CM | POA: Diagnosis not present

## 2016-09-18 LAB — BASIC METABOLIC PANEL
Anion gap: 6 (ref 5–15)
BUN: 22 mg/dL — AB (ref 6–20)
CALCIUM: 9.6 mg/dL (ref 8.9–10.3)
CO2: 27 mmol/L (ref 22–32)
Chloride: 102 mmol/L (ref 101–111)
Creatinine, Ser: 1.22 mg/dL (ref 0.61–1.24)
GFR calc Af Amer: >60 mL/min (ref >60)
GFR calc non Af Amer: >60 mL/min (ref >60)
Glucose, Bld: 110 mg/dL — ABNORMAL HIGH (ref 65–99)
Potassium: 4.9 mmol/L (ref 3.5–5.1)
Sodium: 135 mmol/L (ref 135–145)

## 2016-09-18 MED ORDER — SACUBITRIL-VALSARTAN 97-103 MG PO TABS: 1.0000 | ORAL_TABLET | Freq: Two times a day (BID) | ORAL | 6 refills | Status: DC

## 2016-09-18 NOTE — Progress Notes (Signed)
Advanced Heart Failure Clinic Note   Primary Care: Starts with  care Dr. Adriana Simas in [facility] Primary Cardiologist: Dr. Gala Romney (Previously Dr. Lady Gary)  HPI:  Joshua [name] is a 56 y.o. 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   Pt had not followed up with cardiologist for several years.  Admitted to [facility] 07/21/16 to 07/27/16 with worsening SOB. Cath and echo as below.  EF dropped from previous. Grafts patent and hemodynamics compensated after diuresis.  Diuresed >50 lbs total.   Has been following in PharmD Clinic. K has been high. Spiro cut to 12.5 and told to avoid high K foods.   Presents today for follow up. Feeling good. Watching intake closely. Trying to walk a mile per day. Says he is a whole lot better. Weight at home stable 252-254. No orthopnea, PND or edema. Last K 4.9   Echo 07/21/16  LVEF 20-25%, mild LAE, Mod RV dilation, Mild RAE.    L/RHC 07/26/16    -     1st Mrg lesion, 100 %stenosed.  2nd Mrg lesion, 100 %stenosed.  Acute Mrg lesion, 90 %stenosed.  Mid LAD-1 lesion, 90 %stenosed.  Mid LAD-2 lesion, 95 %stenosed.  Mid Cx lesion, 40 %stenosed.  Prox RCA lesion, 95 %stenosed.  Dist RCA lesion, 90 %stenosed.    Findings: Ao = 96/63 (77) LV = 98/18 RA = 8 RV = 42/10 PA = 39/19 (26) PCW = 14 Fick cardiac output/index = 6.6/2.8 PVR = FA sat = 96% PA sat = 66%, 67%   Past Medical History:  Diagnosis Date  . Acid reflux   . Anasarca   . Arthritis    SHOULDERS  . CHF (congestive heart failure) (HCC)   . Coronary artery disease 04/07/2013   cath by Dr. Lady Gary at [facility]  . Essential hypertension, benign 04/06/2013  . Hypertension   . Kidney stone   . Non-STEMI (non-ST elevated myocardial infarction) (HCC) 04/06/2013  . NSTEMI (non-ST elevated myocardial infarction) (HCC)   . Obesity, unspecified 04/07/2013  . 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   LIMA to LAD, SVG to OM2, SVG to OM3, SVG to  PDA, EVH via right thigh and bilateral lower legs    Current Outpatient Prescriptions  Medication Sig Dispense Refill  . aspirin 81 MG tablet Take 1 tablet (81 mg total) by mouth daily. 90 tablet 3  . atorvastatin (LIPITOR) 40 MG tablet Take 1 tablet (40 mg total) by mouth daily at 6 PM. 90 tablet 3  . carvedilol (COREG) 3.125 MG tablet Take 1 tablet (3.125 mg total) by mouth 2 (two) times daily with a meal. 180 tablet 3  . furosemide (LASIX) 40 MG tablet Take 1 tablet (40 mg total) by mouth daily. 90 tablet 3  . sacubitril-valsartan (ENTRESTO) 49-51 MG Take 1 tablet by mouth 2 (two) times daily. 60 tablet 3  . spironolactone (ALDACTONE) 25 MG tablet Take 0.5 tablets (12.5 mg total) by mouth daily. 90 tablet 3   No current facility-administered medications for this encounter.     No Known Allergies    Social History   Social History  . Marital status: Single    Spouse name: N/A  . Number of children: N/A  . Years of education: N/A   Occupational History  . house remodeler      currently out of work  .  [employer]   Social History Main Topics  .  Smoking status: Never Smoker  . Smokeless tobacco: Never Used     Comment: iI  DIPPED TOBACCO AT ONE TOME"  . Alcohol use No  . Drug use: No  . Sexual activity: Not on file   Other Topics Concern  . 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 Cancer Mother   . Coronary artery disease Father   . Hypertension Father   . Heart attack Paternal Uncle     Vitals:   09/18/16 0956  BP: (!) 150/90  Pulse: 74  SpO2: 99%  Weight: 252 lb 8 oz (114.5 kg)   Wt Readings from Last 3 Encounters:  09/18/16 252 lb 8 oz (114.5 kg)  09/02/16 254 lb (115.2 kg)  08/05/16 246 lb 3.2 oz (111.7 kg)     PHYSICAL EXAM: General:  Well appearing. No resp difficulty HEENT: normal Neck: thick supple.  no obvious JVD. Carotids 2+ bilat; no bruits. No lymphadenopathy or thryomegaly appreciated. Cor: PMI nondisplaced. Regular rate & rhythm. No rubs, gallops or murmurs. Lungs: clear Abdomen: obese, soft, nontender, nondistended. No hepatosplenomegaly. No bruits or masses. Good bowel sounds. Extremities: no cyanosis, clubbing, rash, edema Neuro: alert & orientedx3, cranial nerves grossly intact. moves all 4 extremities w/o difficulty. Affect pleasant   ASSESSMENT & PLAN:  1. 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 Echo 1/15 EF 45-50% - Much improved NYHA II - Volume status looks stable on exam.  - Continue lasix 40 mg daily - Continue coreg 3.125 mg BID - Increase Entresto 97/103 watch K closely - Continue spiro 12.5 mg daily.  - Repeat echo 1 month if EF < 35% consider - OK to return to work as tolerated - Reinforced fluid restriction to < 2 L daily, sodium restriction to less than 2000 mg daily, and the importance of daily weights.   2. CAD s/p CABG - No s/s ischemia. Continue statin and ASA. -  Recent cath stable 3. Snoring - Rescheduled sleep study due to finances.  4. Hyperkalemia - Improving with reduction in spiro dose. Recheck today. Watch closely with Entresto  5. HTN - BP up today. Will increase Entresto to 97/103 bid. Watch K closely.  6. Chronic back pain - Per PCP. Continue tylenol prn.     Arvilla Meres, MD 09/18/16

## 2016-09-18 NOTE — Progress Notes (Signed)
Advanced Heart Failure Medication Review by a Pharmacist  Does the patient  feel that his/her medications are working for him/her?  yes  Has the patient been experiencing any side effects to the medications prescribed?  no  Does the patient measure his/her own blood pressure or blood glucose at home?  no   Does the patient have any problems obtaining medications due to transportation or finances?   no  Understanding of regimen: good Understanding of indications: good Potential of compliance: good Patient understands to avoid NSAIDs. Patient understands to avoid decongestants.  Issues to address at subsequent visits: none.   Pharmacist comments: Joshua [name] is a pleasant 56 y/o male who presents with no medication list or bottles but good recollection of his medication regimen. He endorses good adherence to his medication regimen, and he had no medication-related questions or concerns for me at this time.   Marlinda Mike PharmD Candidate  Time with patient: 10 minutes Preparation and documentation time: 5 minutes Total time: 15 minutes

## 2016-09-18 NOTE — Patient Instructions (Addendum)
Increase Entresto to 97/103 mg Twice daily   Labs today  Labs in 1 week  Your physician recommends that you schedule a follow-up appointment in: 4-6 weeks with echocardiogram

## 2016-09-18 NOTE — Addendum Note (Signed)
Encounter addended by: Noralee Space, RN on: 09/18/2016 10:42 AM<BR>    Actions taken: Order list changed, Diagnosis association updated, Sign clinical note

## 2016-09-26 ENCOUNTER — Other Ambulatory Visit: Payer: Self-pay | Admitting: Internal Medicine

## 2016-09-27 LAB — BASIC METABOLIC PANEL
BUN / CREAT RATIO: 20 (ref 9–20)
BUN: 29 mg/dL — ABNORMAL HIGH (ref 6–24)
CALCIUM: 10.1 mg/dL (ref 8.7–10.2)
CHLORIDE: 102 mmol/L (ref 96–106)
CO2: 22 mmol/L (ref 20–29)
Creatinine, Ser: 1.42 mg/dL — ABNORMAL HIGH (ref 0.76–1.27)
GFR, EST AFRICAN AMERICAN: 63 mL/min/{1.73_m2} (ref >59)
GFR, EST NON AFRICAN AMERICAN: 55 mL/min/{1.73_m2} — AB (ref >59)
Glucose: 107 mg/dL — ABNORMAL HIGH (ref 65–99)
Potassium: 5 mmol/L (ref 3.5–5.2)
Sodium: 139 mmol/L (ref 134–144)

## 2016-09-30 ENCOUNTER — Ambulatory Visit: Payer: BC Managed Care – PPO | Admitting: Pulmonary Disease

## 2016-10-08 ENCOUNTER — Other Ambulatory Visit (HOSPITAL_COMMUNITY): Payer: Self-pay | Admitting: Internal Medicine

## 2016-10-28 ENCOUNTER — Telehealth (HOSPITAL_COMMUNITY): Payer: Self-pay | Admitting: Vascular Surgery

## 2016-10-28 NOTE — Telephone Encounter (Signed)
Pt needs refill on all HF medication please advise

## 2016-11-06 ENCOUNTER — Encounter (HOSPITAL_COMMUNITY): Payer: BC Managed Care – PPO | Admitting: Internal Medicine

## 2016-11-06 ENCOUNTER — Other Ambulatory Visit (HOSPITAL_COMMUNITY): Payer: BC Managed Care – PPO

## 2016-12-18 ENCOUNTER — Ambulatory Visit (HOSPITAL_BASED_OUTPATIENT_CLINIC_OR_DEPARTMENT_OTHER)
Admission: RE | Admit: 2016-12-18 | Discharge: 2016-12-18 | Disposition: A | Discharge status: Home / Self Care | Payer: BC Managed Care – PPO | Source: Ambulatory Visit | Attending: Internal Medicine | Admitting: Internal Medicine

## 2016-12-18 ENCOUNTER — Ambulatory Visit (HOSPITAL_COMMUNITY)
Admission: RE | Admit: 2016-12-18 | Discharge: 2016-12-18 | Disposition: A | Discharge status: Home / Self Care | Payer: BC Managed Care – PPO | Source: Ambulatory Visit | Attending: Internal Medicine | Admitting: Internal Medicine

## 2016-12-18 ENCOUNTER — Encounter (HOSPITAL_COMMUNITY): Payer: Self-pay | Admitting: Internal Medicine

## 2016-12-18 VITALS — BP 156/92 | HR 65 | Wt 263.0 lb

## 2016-12-18 DIAGNOSIS — I10 Essential (primary) hypertension: Secondary | ICD-10-CM | POA: Diagnosis not present

## 2016-12-18 DIAGNOSIS — I5022 Chronic systolic (congestive) heart failure: Secondary | ICD-10-CM | POA: Insufficient documentation

## 2016-12-18 DIAGNOSIS — I5042 Chronic combined systolic (congestive) and diastolic (congestive) heart failure: Secondary | ICD-10-CM

## 2016-12-18 DIAGNOSIS — I340 Nonrheumatic mitral (valve) insufficiency: Secondary | ICD-10-CM | POA: Diagnosis not present

## 2016-12-18 DIAGNOSIS — E6609 Other obesity due to excess calories: Secondary | ICD-10-CM

## 2016-12-18 DIAGNOSIS — I2510 Atherosclerotic heart disease of native coronary artery without angina pectoris: Secondary | ICD-10-CM | POA: Diagnosis not present

## 2016-12-18 DIAGNOSIS — I110 Hypertensive heart disease with heart failure: Secondary | ICD-10-CM | POA: Diagnosis not present

## 2016-12-18 DIAGNOSIS — E875 Hyperkalemia: Secondary | ICD-10-CM

## 2016-12-18 LAB — BASIC METABOLIC PANEL
ANION GAP: 5 (ref 5–15)
BUN: 18 mg/dL (ref 6–20)
CHLORIDE: 106 mmol/L (ref 101–111)
CO2: 25 mmol/L (ref 22–32)
Calcium: 9.2 mg/dL (ref 8.9–10.3)
Creatinine, Ser: 1.24 mg/dL (ref 0.61–1.24)
GFR calc non Af Amer: >60 mL/min (ref >60)
Glucose, Bld: 98 mg/dL (ref 65–99)
POTASSIUM: 4.6 mmol/L (ref 3.5–5.1)
Sodium: 136 mmol/L (ref 135–145)

## 2016-12-18 MED ORDER — CARVEDILOL 6.25 MG PO TABS: 6.2500 mg | ORAL_TABLET | Freq: Two times a day (BID) | ORAL | 3 refills | Status: DC

## 2016-12-18 MED ORDER — SACUBITRIL-VALSARTAN 97-103 MG PO TABS: 1.0000 | ORAL_TABLET | Freq: Two times a day (BID) | ORAL | 6 refills | Status: DC

## 2016-12-18 NOTE — Progress Notes (Signed)
  Echocardiogram 2D Echocardiogram has been performed.  Joshua [name] L Androw 12/18/2016, 11:34 AM

## 2016-12-18 NOTE — Patient Instructions (Addendum)
INCREASE Coreg to 6.25 mg, one tab twice a day  Labs today We will only contact you if something comes back abnormal or we need to make some changes. Otherwise no news is good news!  You have been referred to [facility]- Electrophysiology Dr Ladona Ridgel  Address: [address] [place], [place] [address] Phone: [phone]   Your physician has requested that you have a cardiac MRI. Cardiac MRI uses a computer to create images of your heart as its beating, producing both still and moving pictures of your heart and major blood vessels. For further information please visit [email or web address]. Please follow the instruction sheet given to you today for more information.  Your physician recommends that you schedule a follow-up appointment in: 6-8 weeks with Dr Gala Romney  Do the following things EVERYDAY: 1) Weigh yourself in the morning before breakfast. Write it down and keep it in a log. 2) Take your medicines as prescribed 3) Eat low salt foods-Limit salt (sodium) to 2000 mg per day.  4) Stay as active as you can everyday 5) Limit all fluids for the day to less than 2 liters

## 2016-12-18 NOTE — Progress Notes (Signed)
Advanced Heart Failure Clinic Note   Primary Care: Starts with [place] care Dr. Adriana Simas in [facility] Primary Cardiologist: Dr. Gala Romney (Previously Dr. Lady Gary)  HPI:  Joshua [name] is a 56 y.o. 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   Admitted to [facility] 07/21/16 to 07/27/16 with worsening SOB. Cath and echo as below.  EF dropped from previous. Now 20-25% Grafts patent and hemodynamics compensated after diuresis.  Diuresed >50 lbs total.   [facility] been following [facility]. K has been high. Spiro cut to 12.5 and told to avoid high K foods.   Pt presents today for follow up. Feeling great overall. Works at [employer]. Denies SOB with walking around and doing yard work.  Trying to walk daily. Denies SOB. He snores heavily, but refuses sleep study at this time. Hasn't needed any extra lasix. He feels great and has no complaints today. Denies lightheadedness or dizziness, chest pain or orthopnea.    Echo 07/21/16  LVEF 20-25%, mild LAE, Mod RV dilation, Mild RAE.    L/RHC 07/26/16    -     1st Mrg lesion, 100 %stenosed.  2nd Mrg lesion, 100 %stenosed.  Acute Mrg lesion, 90 %stenosed.  Mid LAD-1 lesion, 90 %stenosed.  Mid LAD-2 lesion, 95 %stenosed.  Mid Cx lesion, 40 %stenosed.  Prox RCA lesion, 95 %stenosed.  Dist RCA lesion, 90 %stenosed.    Findings: Ao = 96/63 (77) LV = 98/18 RA = 8 RV = 42/10 PA = 39/19 (26) PCW = 14 Fick cardiac output/index = 6.6/2.8 PVR = FA sat = 96% PA sat = 66%, 67%   Past Medical History:  Diagnosis Date  . Acid reflux   . Anasarca   . Arthritis    SHOULDERS  . CHF (congestive heart failure) (HCC)   . Coronary artery disease 04/07/2013   cath by Dr. Lady Gary at [facility]  . Essential hypertension, benign 04/06/2013  . Hypertension   . Kidney stone   . Non-STEMI (non-ST elevated myocardial infarction) (HCC) 04/06/2013  . NSTEMI (non-ST elevated myocardial infarction) (HCC)   .  Obesity, unspecified 04/07/2013  . 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   LIMA to LAD, SVG to OM2, SVG to OM3, SVG to PDA, EVH via right thigh and bilateral lower legs    Current Outpatient Prescriptions  Medication Sig Dispense Refill  . aspirin 81 MG tablet Take 1 tablet (81 mg total) by mouth daily. 90 tablet 3  . atorvastatin (LIPITOR) 40 MG tablet Take 1 tablet (40 mg total) by mouth daily at 6 PM. 90 tablet 3  . carvedilol (COREG) 3.125 MG tablet Take 1 tablet (3.125 mg total) by mouth 2 (two) times daily with a meal. 180 tablet 3  . furosemide (LASIX) 40 MG tablet Take 1 tablet (40 mg total) by mouth daily. 90 tablet 3  . sacubitril-valsartan (ENTRESTO) 97-103 MG Take 1 tablet by mouth 2 (two) times daily. 60 tablet 6  . spironolactone (ALDACTONE) 25 MG tablet Take 0.5 tablets (12.5 mg total) by mouth daily. 90 tablet 3   No current facility-administered medications for this encounter.     No Known Allergies    Social History   Social History  . Marital status: Single    Spouse name: N/A  . Number of children: N/A  . Years of education: N/A   Occupational History  . house remodeler      currently out of work  .  [employer]   Social History Main Topics  . Smoking status: Never Smoker  . Smokeless tobacco: Never Used     Comment: iI  DIPPED TOBACCO AT ONE TOME"  . Alcohol use No  . Drug use: No  . Sexual activity: Not on file   Other Topics Concern  . 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 Cancer Mother   . Coronary artery disease Father   . Hypertension Father   . Heart attack Paternal Uncle     Vitals:   12/18/16 1158  BP: (!) 156/92  Pulse: 65  SpO2: 95%  Weight: 263 lb (119.3 kg)   Wt Readings from Last 3 Encounters:  12/18/16 263 lb (119.3 kg)  09/18/16 252 lb 8 oz (114.5 kg)  09/02/16 254 lb (115.2 kg)      PHYSICAL EXAM: General: Well appearing. No resp difficulty. HEENT: Normal Neck: Supple. JVP 5-6. Carotids 2+ bilat; no bruits. No thyromegaly or nodule noted. Cor: PMI nondisplaced. RRR, No M/G/R noted Lungs: CTAB, normal effort. Abdomen: Soft, non-tender, non-distended, no HSM. No bruits or masses. +BS  Extremities: No cyanosis, clubbing, rash, R and LLE no edema.  Neuro: Alert & orientedx3, cranial nerves grossly intact. moves all 4 extremities w/o difficulty. Affect pleasant   ASSESSMENT & PLAN:  1. 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 Echo 1/15 EF 45-50% - NYHA II symptoms and stable.  - Volume status looks OK on exam.  - Continue lasix 40 mg daily. Can take extra as needed.  - Increase coreg 6.25 mg BID.  - Continue Entresto 97/103. BMET today.  - Continue spiro 12.5 mg daily. Intolerant to increase with hyperK.  - EF remains down. Refer to EP for ICD consideration.  - OK to continue work as tolerated. - Reinforced fluid restriction to < 2 L daily, sodium restriction to less than 2000 mg daily, and the importance of daily weights.   2. CAD s/p CABG - No s/s of ischemia.   Continue statin and ASA. - Recent cath stable (07/2016) - will check cMRI for amount of scarring and prognosis for LV dysfunction.  3. Snoring - Refuses sleep study with his other upcoming work up(s).  4. Hyperkalemia - Borderline at 5.0 in July. BMET today.  5. HTN - BP elevated. Meds as above.  6. Chronic back pain - Per PCP. Continue tylenol prn. No change.  Will refer to Dr. Ladona Ridgel to consider [employer] ICD. cMRI to look at scarring and chance for further myocardial recovery.   Graciella Freer, PA-C 12/18/16   Patient seen and examined with the above-signed Advanced Practice Provider and/or Housestaff. I personally reviewed laboratory data, imaging studies and relevant notes. I  independently examined the patient and formulated the important aspects of the plan. I have edited the note to reflect any of my changes or salient points. I have personally discussed the plan with the patient and/or family.  Clinically doing very well. NYHA II. Volume status stable. Echo reviewed personally and EF likely 25-30% rang but formal read is 40-45%. Will get cMRI to further evalaute. If EF <= 35% consider ICD. Will increase carvedilol.   Arvilla Meres, MD  1:23 PM

## 2016-12-23 ENCOUNTER — Encounter: Payer: Self-pay | Admitting: Internal Medicine

## 2017-01-08 ENCOUNTER — Ambulatory Visit (HOSPITAL_COMMUNITY)
Admission: RE | Admit: 2017-01-08 | Discharge: 2017-01-08 | Disposition: A | Discharge status: Home / Self Care | Payer: BC Managed Care – PPO | Source: Ambulatory Visit | Attending: Internal Medicine | Admitting: Internal Medicine

## 2017-01-08 DIAGNOSIS — I5042 Chronic combined systolic (congestive) and diastolic (congestive) heart failure: Secondary | ICD-10-CM | POA: Diagnosis not present

## 2017-01-08 DIAGNOSIS — I255 Ischemic cardiomyopathy: Secondary | ICD-10-CM | POA: Insufficient documentation

## 2017-01-08 DIAGNOSIS — I081 Rheumatic disorders of both mitral and tricuspid valves: Secondary | ICD-10-CM | POA: Insufficient documentation

## 2017-01-08 MED ORDER — GADOBENATE DIMEGLUMINE 529 MG/ML IV SOLN
38.0000 mL | Freq: Once | INTRAVENOUS | Status: AC | PRN
  Administered 2017-01-08: 38 mL via INTRAVENOUS

## 2017-01-30 ENCOUNTER — Encounter (HOSPITAL_COMMUNITY): Payer: BC Managed Care – PPO | Admitting: Internal Medicine

## 2017-01-31 ENCOUNTER — Encounter (HOSPITAL_COMMUNITY): Payer: Self-pay | Admitting: *Deleted

## 2017-02-26 NOTE — Telephone Encounter (Signed)
error 

## 2017-06-05 ENCOUNTER — Other Ambulatory Visit (HOSPITAL_COMMUNITY): Payer: Self-pay | Admitting: Internal Medicine

## 2017-07-25 ENCOUNTER — Telehealth (HOSPITAL_COMMUNITY): Payer: Self-pay | Admitting: *Deleted

## 2017-07-25 NOTE — Telephone Encounter (Signed)
Patient's entresto 49-52 mg PA approved from 07/24/17 through 07/25/18.

## 2017-07-29 ENCOUNTER — Other Ambulatory Visit (HOSPITAL_COMMUNITY): Payer: Self-pay | Admitting: Internal Medicine

## 2017-07-30 ENCOUNTER — Other Ambulatory Visit (HOSPITAL_COMMUNITY): Payer: Self-pay | Admitting: Internal Medicine

## 2017-09-22 ENCOUNTER — Other Ambulatory Visit (HOSPITAL_COMMUNITY): Payer: Self-pay | Admitting: Student

## 2017-09-22 ENCOUNTER — Other Ambulatory Visit (HOSPITAL_COMMUNITY): Payer: Self-pay | Admitting: Internal Medicine

## 2017-11-12 ENCOUNTER — Other Ambulatory Visit (HOSPITAL_COMMUNITY): Payer: Self-pay | Admitting: Student

## 2017-11-12 ENCOUNTER — Other Ambulatory Visit (HOSPITAL_COMMUNITY): Payer: Self-pay | Admitting: Internal Medicine

## 2017-12-31 ENCOUNTER — Other Ambulatory Visit (HOSPITAL_COMMUNITY): Payer: Self-pay | Admitting: Student

## 2017-12-31 ENCOUNTER — Other Ambulatory Visit (HOSPITAL_COMMUNITY): Payer: Self-pay | Admitting: Internal Medicine

## 2018-03-02 ENCOUNTER — Other Ambulatory Visit (HOSPITAL_COMMUNITY): Payer: Self-pay | Admitting: *Deleted

## 2018-03-02 MED ORDER — SACUBITRIL-VALSARTAN 97-103 MG PO TABS: 1.0000 | ORAL_TABLET | Freq: Two times a day (BID) | ORAL | 3 refills | Status: DC

## 2018-03-02 MED ORDER — ATORVASTATIN CALCIUM 40 MG PO TABS: ORAL_TABLET | ORAL | 3 refills | Status: DC

## 2018-03-06 ENCOUNTER — Other Ambulatory Visit (HOSPITAL_COMMUNITY): Payer: Self-pay

## 2018-03-06 MED ORDER — SACUBITRIL-VALSARTAN 97-103 MG PO TABS: 1.0000 | ORAL_TABLET | Freq: Two times a day (BID) | ORAL | 6 refills | Status: DC

## 2018-04-01 ENCOUNTER — Other Ambulatory Visit (HOSPITAL_COMMUNITY): Payer: Self-pay | Admitting: Student

## 2018-04-03 ENCOUNTER — Other Ambulatory Visit (HOSPITAL_COMMUNITY): Payer: Self-pay | Admitting: *Deleted

## 2018-04-03 ENCOUNTER — Other Ambulatory Visit (HOSPITAL_COMMUNITY): Payer: Self-pay

## 2018-04-03 MED ORDER — FUROSEMIDE 40 MG PO TABS: ORAL_TABLET | ORAL | 2 refills | Status: DC

## 2018-05-14 IMAGING — CR DG ABDOMEN ACUTE W/ 1V CHEST
1 series · 4 of 4 positions shown · non-contrast
Comparison: Radiographs of May 03, 2013.

CLINICAL DATA: Acute generalized abdominal pain.

EXAM:
DG ABDOMEN ACUTE W/ 1V CHEST

[Series 1: dg abd acute w/chest · 0.14mm/px · 4 of 4 slices shown]
[im 1/4]
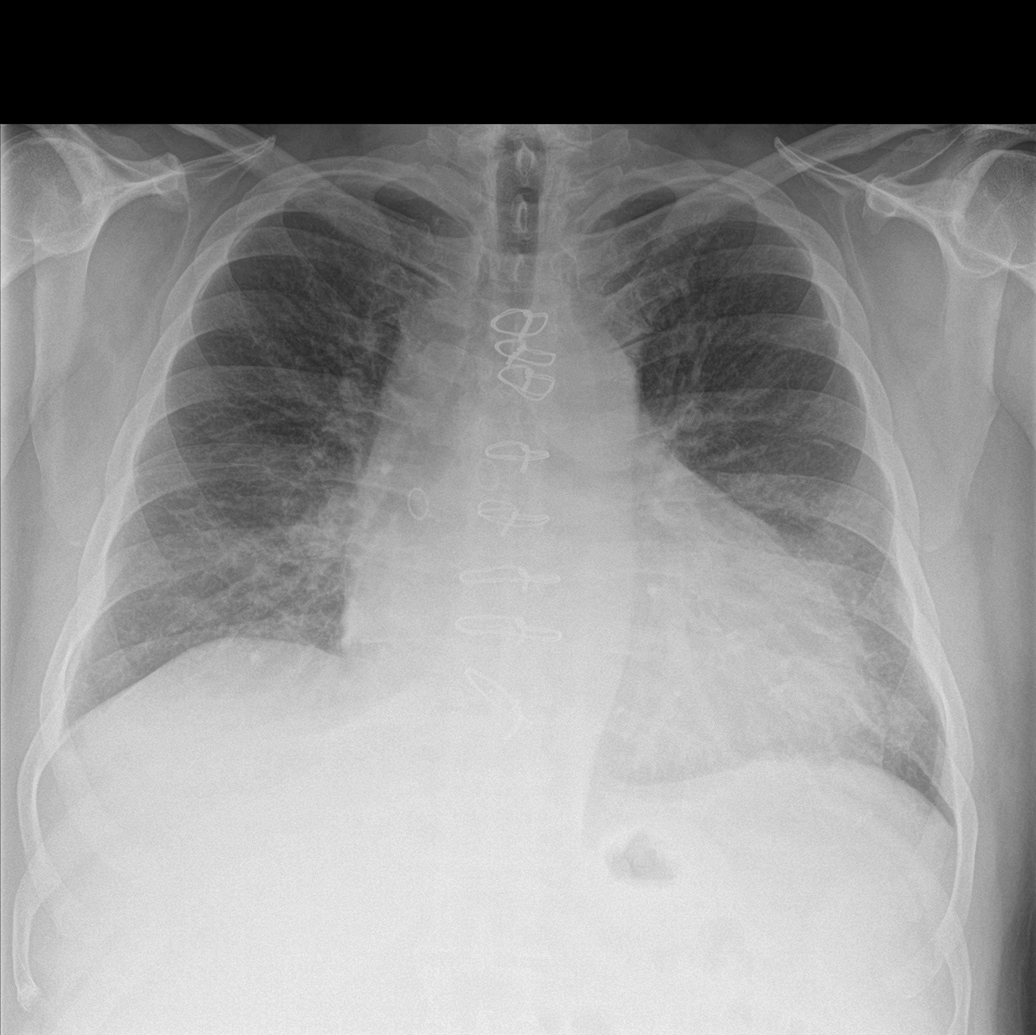
[im 2/4]
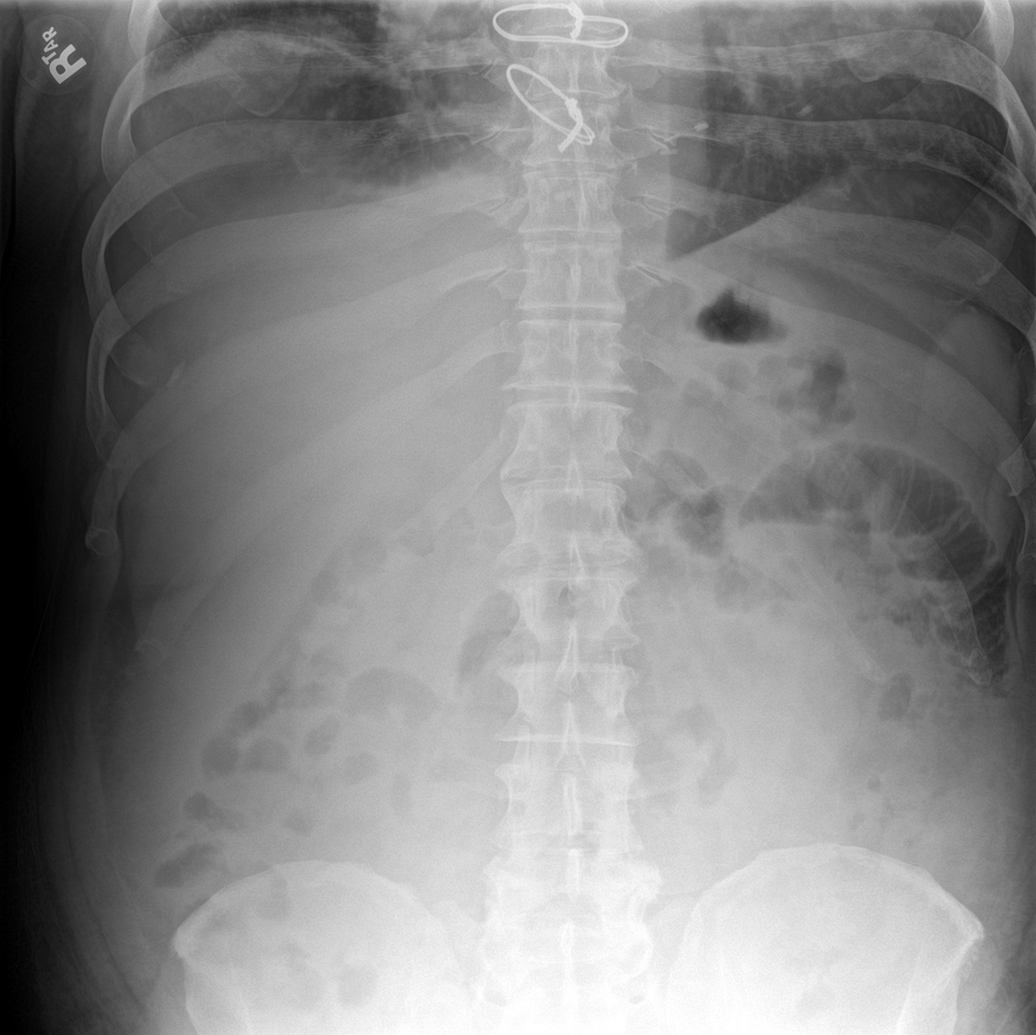
[im 3/4]
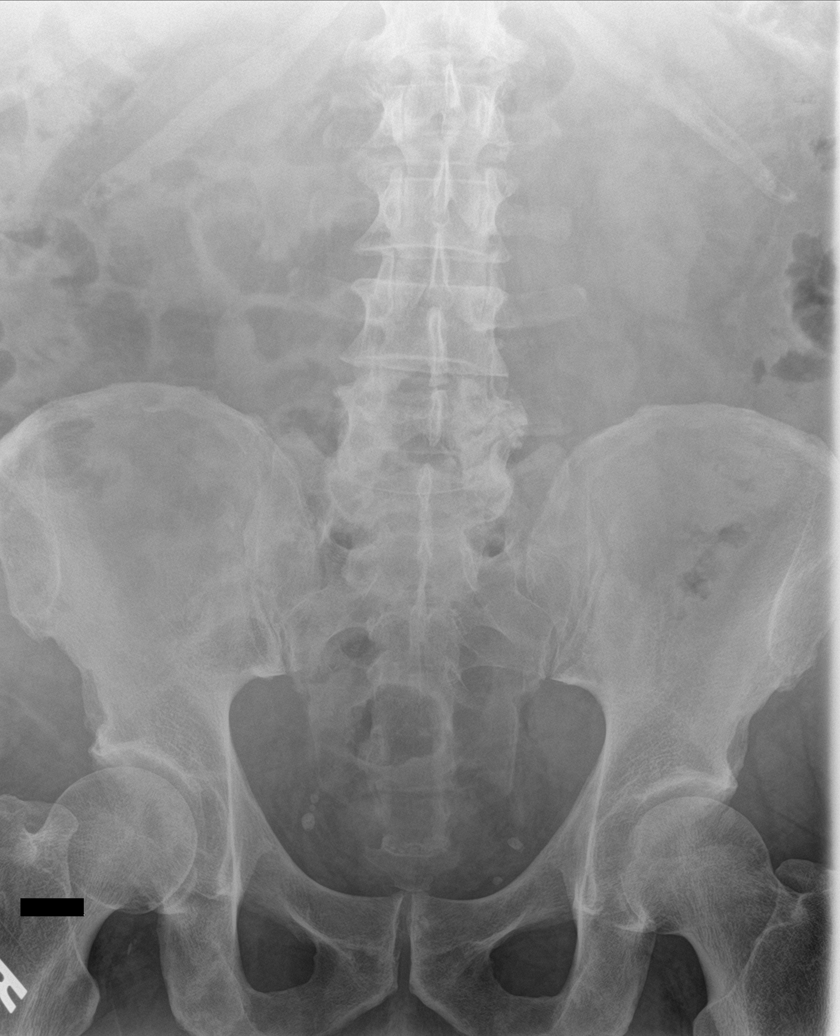
[im 4/4]
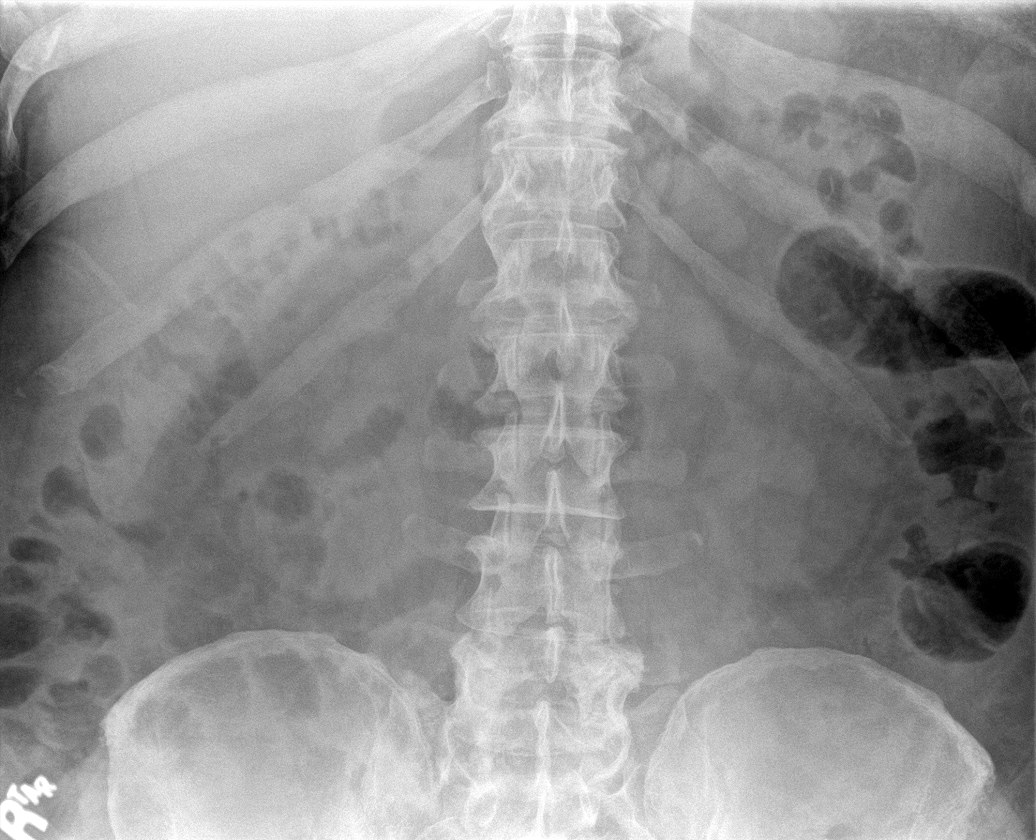

[4 of 4 positions shown; findings below may reference images not displayed]

FINDINGS: Stable cardiomegaly. Status post coronary artery bypass graft.
Stable mild central pulmonary vascular congestion. No pneumothorax
or pleural effusion is noted. There is no evidence of bowel
obstruction or ileus. Phleboliths are noted in the pelvis.
IMPRESSION: No evidence of bowel obstruction or ileus. Stable cardiomegaly with
mild central pulmonary vascular congestion.

## 2018-08-12 ENCOUNTER — Other Ambulatory Visit (HOSPITAL_COMMUNITY): Payer: Self-pay | Admitting: Internal Medicine

## 2018-10-07 ENCOUNTER — Other Ambulatory Visit (HOSPITAL_COMMUNITY): Payer: Self-pay | Admitting: Internal Medicine

## 2018-12-24 ENCOUNTER — Other Ambulatory Visit (HOSPITAL_COMMUNITY): Payer: Self-pay

## 2018-12-24 MED ORDER — ATORVASTATIN CALCIUM 40 MG PO TABS: ORAL_TABLET | ORAL | 3 refills | Status: DC

## 2018-12-24 MED ORDER — ENTRESTO 97-103 MG PO TABS: 1.0000 | ORAL_TABLET | Freq: Two times a day (BID) | ORAL | 0 refills | Status: DC

## 2018-12-25 ENCOUNTER — Telehealth (HOSPITAL_COMMUNITY): Payer: Self-pay | Admitting: Pharmacist

## 2018-12-25 NOTE — Telephone Encounter (Signed)
Patient Advocate Encounter   Received notification from [facility] that prior authorization for Joshua [name] is required.   PA submitted on CoverMyMeds Key [id number] Status is pending   Will continue to follow.  Audry Riles, PharmD, BCPS, CPP Heart Failure Clinic Pharmacist [phone]

## 2018-12-25 NOTE — Telephone Encounter (Signed)
Advanced Heart Failure Patient Advocate Encounter  Prior Authorization for Delene Loll has been approved.    Effective dates: 12/25/2018 through 12/25/2019  Patients co-pay is $10.00  Audry Riles, PharmD, BCPS, CPP Heart Failure Clinic Pharmacist [phone]

## 2019-03-24 ENCOUNTER — Other Ambulatory Visit: Payer: Self-pay

## 2019-03-24 MED ORDER — CARVEDILOL 6.25 MG PO TABS: 6.2500 mg | ORAL_TABLET | Freq: Two times a day (BID) | ORAL | 0 refills | Status: DC

## 2019-04-23 ENCOUNTER — Other Ambulatory Visit (HOSPITAL_COMMUNITY): Payer: Self-pay

## 2019-04-23 MED ORDER — ENTRESTO 97-103 MG PO TABS: 1.0000 | ORAL_TABLET | Freq: Two times a day (BID) | ORAL | 0 refills | Status: DC

## 2019-04-23 MED ORDER — CARVEDILOL 6.25 MG PO TABS: 6.2500 mg | ORAL_TABLET | Freq: Two times a day (BID) | ORAL | 0 refills | Status: DC

## 2021-01-09 ENCOUNTER — Inpatient Hospital Stay (HOSPITAL_COMMUNITY)
Admission: AD | Admit: 2021-01-09 | Discharge: 2021-01-14 | DRG: 024 | Disposition: A | Discharge status: Rehabilitation Facility | Payer: BC Managed Care – PPO | Source: Other Acute Inpatient Hospital | Attending: Neurology | Admitting: Neurology

## 2021-01-09 ENCOUNTER — Inpatient Hospital Stay (HOSPITAL_COMMUNITY): Payer: BC Managed Care – PPO | Admitting: Anesthesiology

## 2021-01-09 ENCOUNTER — Emergency Department: Payer: BC Managed Care – PPO

## 2021-01-09 ENCOUNTER — Encounter: Payer: Self-pay | Admitting: Emergency Medicine

## 2021-01-09 ENCOUNTER — Emergency Department
Admission: EM | Admit: 2021-01-09 | Discharge: 2021-01-09 | Disposition: A | Discharge status: Other Acute Inpatient Hospital | Payer: BC Managed Care – PPO | Attending: Emergency Medicine | Admitting: Emergency Medicine

## 2021-01-09 ENCOUNTER — Inpatient Hospital Stay (HOSPITAL_COMMUNITY): Payer: BC Managed Care – PPO

## 2021-01-09 ENCOUNTER — Encounter (HOSPITAL_COMMUNITY)
Admission: AD | Disposition: A | Discharge status: Rehabilitation Facility | Payer: Self-pay | Source: Other Acute Inpatient Hospital | Attending: Neurology

## 2021-01-09 DIAGNOSIS — I6312 Cerebral infarction due to embolism of basilar artery: Principal | ICD-10-CM | POA: Diagnosis present

## 2021-01-09 DIAGNOSIS — D751 Secondary polycythemia: Secondary | ICD-10-CM | POA: Diagnosis present

## 2021-01-09 DIAGNOSIS — I110 Hypertensive heart disease with heart failure: Secondary | ICD-10-CM | POA: Insufficient documentation

## 2021-01-09 DIAGNOSIS — I63013 Cerebral infarction due to thrombosis of bilateral vertebral arteries: Secondary | ICD-10-CM

## 2021-01-09 DIAGNOSIS — R471 Dysarthria and anarthria: Secondary | ICD-10-CM | POA: Diagnosis present

## 2021-01-09 DIAGNOSIS — Z87442 Personal history of urinary calculi: Secondary | ICD-10-CM | POA: Diagnosis not present

## 2021-01-09 DIAGNOSIS — I6389 Other cerebral infarction: Secondary | ICD-10-CM | POA: Diagnosis not present

## 2021-01-09 DIAGNOSIS — I454 Nonspecific intraventricular block: Secondary | ICD-10-CM | POA: Diagnosis present

## 2021-01-09 DIAGNOSIS — Z7982 Long term (current) use of aspirin: Secondary | ICD-10-CM | POA: Insufficient documentation

## 2021-01-09 DIAGNOSIS — I2510 Atherosclerotic heart disease of native coronary artery without angina pectoris: Secondary | ICD-10-CM | POA: Insufficient documentation

## 2021-01-09 DIAGNOSIS — I161 Hypertensive emergency: Secondary | ICD-10-CM

## 2021-01-09 DIAGNOSIS — Z87891 Personal history of nicotine dependence: Secondary | ICD-10-CM

## 2021-01-09 DIAGNOSIS — F419 Anxiety disorder, unspecified: Secondary | ICD-10-CM | POA: Diagnosis present

## 2021-01-09 DIAGNOSIS — M1990 Unspecified osteoarthritis, unspecified site: Secondary | ICD-10-CM | POA: Diagnosis present

## 2021-01-09 DIAGNOSIS — Z79899 Other long term (current) drug therapy: Secondary | ICD-10-CM

## 2021-01-09 DIAGNOSIS — R531 Weakness: Secondary | ICD-10-CM | POA: Diagnosis present

## 2021-01-09 DIAGNOSIS — R29703 NIHSS score 3: Secondary | ICD-10-CM | POA: Diagnosis present

## 2021-01-09 DIAGNOSIS — K219 Gastro-esophageal reflux disease without esophagitis: Secondary | ICD-10-CM | POA: Diagnosis present

## 2021-01-09 DIAGNOSIS — I5042 Chronic combined systolic (congestive) and diastolic (congestive) heart failure: Secondary | ICD-10-CM | POA: Diagnosis present

## 2021-01-09 DIAGNOSIS — I5021 Acute systolic (congestive) heart failure: Secondary | ICD-10-CM

## 2021-01-09 DIAGNOSIS — Z8249 Family history of ischemic heart disease and other diseases of the circulatory system: Secondary | ICD-10-CM

## 2021-01-09 DIAGNOSIS — I10 Essential (primary) hypertension: Secondary | ICD-10-CM

## 2021-01-09 DIAGNOSIS — Z951 Presence of aortocoronary bypass graft: Secondary | ICD-10-CM | POA: Insufficient documentation

## 2021-01-09 DIAGNOSIS — I63113 Cerebral infarction due to embolism of bilateral vertebral arteries: Secondary | ICD-10-CM | POA: Diagnosis present

## 2021-01-09 DIAGNOSIS — Z20822 Contact with and (suspected) exposure to covid-19: Secondary | ICD-10-CM | POA: Insufficient documentation

## 2021-01-09 DIAGNOSIS — I639 Cerebral infarction, unspecified: Secondary | ICD-10-CM | POA: Insufficient documentation

## 2021-01-09 DIAGNOSIS — I651 Occlusion and stenosis of basilar artery: Secondary | ICD-10-CM | POA: Diagnosis present

## 2021-01-09 DIAGNOSIS — R001 Bradycardia, unspecified: Secondary | ICD-10-CM | POA: Diagnosis present

## 2021-01-09 DIAGNOSIS — R008 Other abnormalities of heart beat: Secondary | ICD-10-CM | POA: Diagnosis not present

## 2021-01-09 DIAGNOSIS — R270 Ataxia, unspecified: Secondary | ICD-10-CM | POA: Diagnosis present

## 2021-01-09 DIAGNOSIS — E785 Hyperlipidemia, unspecified: Secondary | ICD-10-CM | POA: Diagnosis present

## 2021-01-09 DIAGNOSIS — E7800 Pure hypercholesterolemia, unspecified: Secondary | ICD-10-CM

## 2021-01-09 DIAGNOSIS — I252 Old myocardial infarction: Secondary | ICD-10-CM | POA: Diagnosis not present

## 2021-01-09 DIAGNOSIS — E669 Obesity, unspecified: Secondary | ICD-10-CM | POA: Diagnosis present

## 2021-01-09 DIAGNOSIS — E6609 Other obesity due to excess calories: Secondary | ICD-10-CM | POA: Diagnosis present

## 2021-01-09 DIAGNOSIS — I63532 Cerebral infarction due to unspecified occlusion or stenosis of left posterior cerebral artery: Secondary | ICD-10-CM | POA: Diagnosis not present

## 2021-01-09 HISTORY — PX: IR US GUIDE VASC ACCESS RIGHT: IMG2390

## 2021-01-09 HISTORY — PX: IR ANGIO INTRA EXTRACRAN SEL COM CAROTID INNOMINATE UNI R MOD SED: IMG5359

## 2021-01-09 HISTORY — PX: RADIOLOGY WITH ANESTHESIA: SHX6223

## 2021-01-09 HISTORY — PX: IR PERCUTANEOUS ART THROMBECTOMY/INFUSION INTRACRANIAL INC DIAG ANGIO: IMG6087

## 2021-01-09 LAB — CBC
HCT: 53 % — ABNORMAL HIGH (ref 39.0–52.0)
Hemoglobin: 18.1 g/dL — ABNORMAL HIGH (ref 13.0–17.0)
MCH: 30.6 pg (ref 26.0–34.0)
MCHC: 34.2 g/dL (ref 30.0–36.0)
MCV: 89.7 fL (ref 80.0–100.0)
Platelets: 202 10*3/uL (ref 150–400)
RBC: 5.91 MIL/uL — ABNORMAL HIGH (ref 4.22–5.81)
RDW: 14.2 % (ref 11.5–15.5)
WBC: 11.2 10*3/uL — ABNORMAL HIGH (ref 4.0–10.5)
nRBC: 0 % (ref 0.0–0.2)

## 2021-01-09 LAB — URINALYSIS, ROUTINE W REFLEX MICROSCOPIC
Bacteria, UA: NONE SEEN
Bilirubin Urine: NEGATIVE
Glucose, UA: NEGATIVE mg/dL
Hgb urine dipstick: NEGATIVE
Ketones, ur: 5 mg/dL — AB
Leukocytes,Ua: NEGATIVE
Nitrite: NEGATIVE
Protein, ur: 30 mg/dL — AB
Specific Gravity, Urine: 1.044 — ABNORMAL HIGH (ref 1.005–1.030)
Squamous Epithelial / HPF: NONE SEEN (ref 0–5)
pH: 5 (ref 5.0–8.0)

## 2021-01-09 LAB — COMPREHENSIVE METABOLIC PANEL
ALT: 20 U/L (ref 0–44)
AST: 20 U/L (ref 15–41)
Albumin: 4.9 g/dL (ref 3.5–5.0)
Alkaline Phosphatase: 54 U/L (ref 38–126)
Anion gap: 11 (ref 5–15)
BUN: 22 mg/dL — ABNORMAL HIGH (ref 6–20)
CO2: 25 mmol/L (ref 22–32)
Calcium: 9.4 mg/dL (ref 8.9–10.3)
Chloride: 101 mmol/L (ref 98–111)
Creatinine, Ser: 1.02 mg/dL (ref 0.61–1.24)
GFR, Estimated: >60 mL/min (ref >60)
Glucose, Bld: 154 mg/dL — ABNORMAL HIGH (ref 70–99)
Potassium: 4.2 mmol/L (ref 3.5–5.1)
Sodium: 137 mmol/L (ref 135–145)
Total Bilirubin: 1.3 mg/dL — ABNORMAL HIGH (ref 0.3–1.2)
Total Protein: 8.3 g/dL — ABNORMAL HIGH (ref 6.5–8.1)

## 2021-01-09 LAB — DIFFERENTIAL
Abs Immature Granulocytes: 0.05 10*3/uL (ref 0.00–0.07)
Basophils Absolute: 0 10*3/uL (ref 0.0–0.1)
Basophils Relative: 0 %
Eosinophils Absolute: 0 10*3/uL (ref 0.0–0.5)
Eosinophils Relative: 0 %
Immature Granulocytes: 0 %
Lymphocytes Relative: 8 %
Lymphs Abs: 0.9 10*3/uL (ref 0.7–4.0)
Monocytes Absolute: 0.4 10*3/uL (ref 0.1–1.0)
Monocytes Relative: 4 %
Neutro Abs: 9.8 10*3/uL — ABNORMAL HIGH (ref 1.7–7.7)
Neutrophils Relative %: 88 %

## 2021-01-09 LAB — URINE DRUG SCREEN, QUALITATIVE (ARMC ONLY)
Amphetamines, Ur Screen: NOT DETECTED
Barbiturates, Ur Screen: NOT DETECTED
Benzodiazepine, Ur Scrn: NOT DETECTED
Cannabinoid 50 Ng, Ur ~~LOC~~: NOT DETECTED
Cocaine Metabolite,Ur ~~LOC~~: NOT DETECTED
MDMA (Ecstasy)Ur Screen: NOT DETECTED
Methadone Scn, Ur: NOT DETECTED
Opiate, Ur Screen: NOT DETECTED
Phencyclidine (PCP) Ur S: NOT DETECTED
Tricyclic, Ur Screen: NOT DETECTED

## 2021-01-09 LAB — ETHANOL: Alcohol, Ethyl (B): <10 mg/dL (ref <10)

## 2021-01-09 LAB — RESP PANEL BY RT-PCR (FLU A&B, COVID) ARPGX2
Influenza A by PCR: NEGATIVE
Influenza B by PCR: NEGATIVE
SARS Coronavirus 2 by RT PCR: NEGATIVE

## 2021-01-09 LAB — APTT: aPTT: 30 seconds (ref 24–36)

## 2021-01-09 LAB — PROTIME-INR
INR: 1.1 (ref 0.8–1.2)
Prothrombin Time: 13.7 seconds (ref 11.4–15.2)

## 2021-01-09 LAB — HIV ANTIBODY (ROUTINE TESTING W REFLEX): HIV Screen 4th Generation wRfx: NONREACTIVE

## 2021-01-09 LAB — CBG MONITORING, ED: Glucose-Capillary: 141 mg/dL — ABNORMAL HIGH (ref 70–99)

## 2021-01-09 SURGERY — IR WITH ANESTHESIA
Anesthesia: General

## 2021-01-09 MED ORDER — ROCURONIUM BROMIDE 100 MG/10ML IV SOLN
INTRAVENOUS | Status: DC | PRN
  Administered 2021-01-09 (×2): 20 mg via INTRAVENOUS
  Administered 2021-01-09: 30 mg via INTRAVENOUS
  Administered 2021-01-09: 80 mg via INTRAVENOUS
  Administered 2021-01-09: 20 mg via INTRAVENOUS

## 2021-01-09 MED ORDER — NITROGLYCERIN 1 MG/10 ML FOR IR/CATH LAB
INTRA_ARTERIAL | Status: AC
  Filled 2021-01-09: qty 10

## 2021-01-09 MED ORDER — SUGAMMADEX SODIUM 200 MG/2ML IV SOLN
INTRAVENOUS | Status: DC | PRN
  Administered 2021-01-09: 200 mg via INTRAVENOUS
  Administered 2021-01-09: 100 mg via INTRAVENOUS

## 2021-01-09 MED ORDER — ONDANSETRON HCL 4 MG/2ML IJ SOLN
4.0000 mg | Freq: Once | INTRAMUSCULAR | Status: AC
  Administered 2021-01-09: 4 mg via INTRAVENOUS
  Filled 2021-01-09: qty 2

## 2021-01-09 MED ORDER — CANGRELOR TETRASODIUM 50 MG IV SOLR
INTRAVENOUS | Status: AC
  Filled 2021-01-09: qty 50

## 2021-01-09 MED ORDER — CEFAZOLIN SODIUM-DEXTROSE 2-4 GM/100ML-% IV SOLN
INTRAVENOUS | Status: AC
  Filled 2021-01-09: qty 100

## 2021-01-09 MED ORDER — LIDOCAINE HCL 1 % IJ SOLN
INTRAMUSCULAR | Status: AC
  Filled 2021-01-09: qty 20

## 2021-01-09 MED ORDER — FUROSEMIDE 40 MG PO TABS
40.0000 mg | ORAL_TABLET | Freq: Every day | ORAL | Status: DC
  Administered 2021-01-09 – 2021-01-14 (×6): 40 mg via ORAL
  Filled 2021-01-09 (×6): qty 1

## 2021-01-09 MED ORDER — TICAGRELOR 60 MG PO TABS
ORAL_TABLET | ORAL | Status: DC | PRN
  Administered 2021-01-09: 180 mg

## 2021-01-09 MED ORDER — TICAGRELOR 90 MG PO TABS
90.0000 mg | ORAL_TABLET | Freq: Two times a day (BID) | ORAL | Status: DC
  Administered 2021-01-10 – 2021-01-14 (×9): 90 mg via ORAL
  Filled 2021-01-09 (×10): qty 1

## 2021-01-09 MED ORDER — ASPIRIN 300 MG RE SUPP: 300.0000 mg | Freq: Every day | RECTAL | Status: DC

## 2021-01-09 MED ORDER — LACTATED RINGERS IV SOLN: INTRAVENOUS | Status: DC | PRN

## 2021-01-09 MED ORDER — ACETAMINOPHEN 325 MG PO TABS
650.0000 mg | ORAL_TABLET | ORAL | Status: DC | PRN
  Filled 2021-01-09: qty 2

## 2021-01-09 MED ORDER — ONDANSETRON HCL 4 MG/2ML IJ SOLN
INTRAMUSCULAR | Status: DC | PRN
  Administered 2021-01-09: 4 mg via INTRAVENOUS

## 2021-01-09 MED ORDER — CLEVIDIPINE BUTYRATE 0.5 MG/ML IV EMUL
0.0000 mg/h | INTRAVENOUS | Status: DC
  Administered 2021-01-09: 21 mg/h via INTRAVENOUS
  Filled 2021-01-09 (×2): qty 50

## 2021-01-09 MED ORDER — SODIUM CHLORIDE 0.9 % IV SOLN: INTRAVENOUS | Status: DC

## 2021-01-09 MED ORDER — CEFAZOLIN SODIUM-DEXTROSE 2-3 GM-%(50ML) IV SOLR
INTRAVENOUS | Status: DC | PRN
  Administered 2021-01-09: 2 g via INTRAVENOUS

## 2021-01-09 MED ORDER — ENOXAPARIN SODIUM 40 MG/0.4ML IJ SOSY
40.0000 mg | PREFILLED_SYRINGE | INTRAMUSCULAR | Status: DC
  Administered 2021-01-09 – 2021-01-13 (×5): 40 mg via SUBCUTANEOUS
  Filled 2021-01-09 (×5): qty 0.4

## 2021-01-09 MED ORDER — PHENYLEPHRINE HCL-NACL 20-0.9 MG/250ML-% IV SOLN
INTRAVENOUS | Status: DC | PRN
  Administered 2021-01-09: 20 ug/min via INTRAVENOUS

## 2021-01-09 MED ORDER — ACETAMINOPHEN 160 MG/5ML PO SOLN: 650.0000 mg | ORAL | Status: DC | PRN

## 2021-01-09 MED ORDER — VERAPAMIL HCL 2.5 MG/ML IV SOLN: INTRA_ARTERIAL | Status: DC | PRN

## 2021-01-09 MED ORDER — CLEVIDIPINE BUTYRATE 0.5 MG/ML IV EMUL
INTRAVENOUS | Status: DC | PRN
  Administered 2021-01-09: 2 mg/h via INTRAVENOUS

## 2021-01-09 MED ORDER — ATORVASTATIN CALCIUM 40 MG PO TABS
40.0000 mg | ORAL_TABLET | Freq: Every day | ORAL | Status: DC
  Administered 2021-01-09 – 2021-01-10 (×2): 40 mg via ORAL
  Filled 2021-01-09 (×2): qty 1

## 2021-01-09 MED ORDER — CANGRELOR BOLUS VIA INFUSION
INTRAVENOUS | Status: DC | PRN
  Administered 2021-01-09: 1785 ug via INTRAVENOUS

## 2021-01-09 MED ORDER — CARVEDILOL 6.25 MG PO TABS
6.2500 mg | ORAL_TABLET | Freq: Two times a day (BID) | ORAL | Status: DC
  Administered 2021-01-10 – 2021-01-14 (×9): 6.25 mg via ORAL
  Filled 2021-01-09: qty 1
  Filled 2021-01-09: qty 2
  Filled 2021-01-09 (×3): qty 1
  Filled 2021-01-09: qty 2
  Filled 2021-01-09 (×3): qty 1

## 2021-01-09 MED ORDER — ASPIRIN 300 MG RE SUPP
300.0000 mg | Freq: Once | RECTAL | Status: AC
  Administered 2021-01-09: 300 mg via RECTAL
  Filled 2021-01-09: qty 1

## 2021-01-09 MED ORDER — ACETAMINOPHEN 650 MG RE SUPP: 650.0000 mg | RECTAL | Status: DC | PRN

## 2021-01-09 MED ORDER — CLEVIDIPINE BUTYRATE 0.5 MG/ML IV EMUL
INTRAVENOUS | Status: AC
  Filled 2021-01-09: qty 50

## 2021-01-09 MED ORDER — VERAPAMIL HCL 2.5 MG/ML IV SOLN
INTRAVENOUS | Status: AC
  Filled 2021-01-09: qty 2

## 2021-01-09 MED ORDER — ACETAMINOPHEN 325 MG RE SUPP: 650.0000 mg | RECTAL | Status: DC | PRN

## 2021-01-09 MED ORDER — IOHEXOL 300 MG/ML  SOLN
300.0000 mL | Freq: Once | INTRAMUSCULAR | Status: AC | PRN
  Administered 2021-01-09: 258 mL via INTRA_ARTERIAL

## 2021-01-09 MED ORDER — TICAGRELOR 90 MG PO TABS: 90.0000 mg | ORAL_TABLET | Freq: Two times a day (BID) | ORAL | Status: DC

## 2021-01-09 MED ORDER — ASPIRIN 81 MG PO CHEW
81.0000 mg | CHEWABLE_TABLET | Freq: Every day | ORAL | Status: DC
  Administered 2021-01-10 – 2021-01-14 (×5): 81 mg via ORAL
  Filled 2021-01-09 (×5): qty 1

## 2021-01-09 MED ORDER — FENTANYL CITRATE (PF) 100 MCG/2ML IJ SOLN
INTRAMUSCULAR | Status: AC
  Filled 2021-01-09: qty 2

## 2021-01-09 MED ORDER — HEPARIN SODIUM (PORCINE) 1000 UNIT/ML IJ SOLN
INTRAMUSCULAR | Status: AC
  Filled 2021-01-09: qty 1

## 2021-01-09 MED ORDER — ASPIRIN 81 MG PO CHEW: 81.0000 mg | CHEWABLE_TABLET | Freq: Every day | ORAL | Status: DC

## 2021-01-09 MED ORDER — CLOPIDOGREL BISULFATE 75 MG PO TABS
300.0000 mg | ORAL_TABLET | Freq: Once | ORAL | Status: DC
  Filled 2021-01-09: qty 4

## 2021-01-09 MED ORDER — NITROGLYCERIN 1 MG/10 ML FOR IR/CATH LAB
INTRA_ARTERIAL | Status: DC | PRN
  Administered 2021-01-09: 200 ug via INTRA_ARTERIAL

## 2021-01-09 MED ORDER — SPIRONOLACTONE 25 MG PO TABS
25.0000 mg | ORAL_TABLET | Freq: Every day | ORAL | Status: DC
  Administered 2021-01-09 – 2021-01-14 (×6): 25 mg via ORAL
  Filled 2021-01-09 (×4): qty 1

## 2021-01-09 MED ORDER — PHENYLEPHRINE 40 MCG/ML (10ML) SYRINGE FOR IV PUSH (FOR BLOOD PRESSURE SUPPORT)
PREFILLED_SYRINGE | INTRAVENOUS | Status: DC | PRN
  Administered 2021-01-09: 80 ug via INTRAVENOUS

## 2021-01-09 MED ORDER — ACETAMINOPHEN 325 MG PO TABS: 650.0000 mg | ORAL_TABLET | ORAL | Status: DC | PRN

## 2021-01-09 MED ORDER — STROKE: EARLY STAGES OF RECOVERY BOOK: Freq: Once | Status: DC

## 2021-01-09 MED ORDER — PROPOFOL 10 MG/ML IV BOLUS
INTRAVENOUS | Status: DC | PRN
  Administered 2021-01-09: 170 mg via INTRAVENOUS

## 2021-01-09 MED ORDER — SODIUM CHLORIDE (PF) 0.9 % IJ SOLN
INTRAVENOUS | Status: DC | PRN
  Administered 2021-01-09: 25 ug via INTRA_ARTERIAL

## 2021-01-09 MED ORDER — IOHEXOL 350 MG/ML SOLN
75.0000 mL | Freq: Once | INTRAVENOUS | Status: AC | PRN
  Administered 2021-01-09: 75 mL via INTRAVENOUS

## 2021-01-09 MED ORDER — SUCCINYLCHOLINE CHLORIDE 200 MG/10ML IV SOSY
PREFILLED_SYRINGE | INTRAVENOUS | Status: DC | PRN
  Administered 2021-01-09: 120 mg via INTRAVENOUS

## 2021-01-09 MED ORDER — TICAGRELOR 90 MG PO TABS
ORAL_TABLET | ORAL | Status: AC
  Filled 2021-01-09: qty 2

## 2021-01-09 MED ORDER — SENNOSIDES-DOCUSATE SODIUM 8.6-50 MG PO TABS: 1.0000 | ORAL_TABLET | Freq: Two times a day (BID) | ORAL | Status: DC

## 2021-01-09 MED ORDER — CLEVIDIPINE BUTYRATE 0.5 MG/ML IV EMUL
0.0000 mg/h | INTRAVENOUS | Status: DC
  Administered 2021-01-09 (×3): 21 mg/h via INTRAVENOUS
  Administered 2021-01-09: 19 mg/h via INTRAVENOUS
  Administered 2021-01-10: 14 mg/h via INTRAVENOUS
  Administered 2021-01-10: 17 mg/h via INTRAVENOUS
  Filled 2021-01-09 (×7): qty 50

## 2021-01-09 MED ORDER — SODIUM CHLORIDE 0.9 % IV SOLN
INTRAVENOUS | Status: AC | PRN
  Administered 2021-01-09: 2 ug/kg/min via INTRAVENOUS

## 2021-01-09 MED ORDER — LACTATED RINGERS IV BOLUS
1000.0000 mL | Freq: Once | INTRAVENOUS | Status: AC
  Administered 2021-01-09: 1000 mL via INTRAVENOUS

## 2021-01-09 MED ORDER — LIDOCAINE HCL (CARDIAC) PF 100 MG/5ML IV SOSY
PREFILLED_SYRINGE | INTRAVENOUS | Status: DC | PRN
  Administered 2021-01-09: 100 mg via INTRATRACHEAL

## 2021-01-09 MED ORDER — HEPARIN SODIUM (PORCINE) 5000 UNIT/ML IJ SOLN: 5000.0000 [IU] | Freq: Three times a day (TID) | INTRAMUSCULAR | Status: DC

## 2021-01-09 MED ORDER — ASPIRIN 325 MG PO TABS: 325.0000 mg | ORAL_TABLET | Freq: Every day | ORAL | Status: DC

## 2021-01-09 MED ORDER — FENTANYL CITRATE (PF) 250 MCG/5ML IJ SOLN
INTRAMUSCULAR | Status: DC | PRN
  Administered 2021-01-09: 100 ug via INTRAVENOUS

## 2021-01-09 NOTE — Anesthesia Procedure Notes (Signed)
Arterial Line Insertion Start/End10/18/2022 6:15 AM, 01/09/2021 6:36 AM Performed by: Claudina Lick, CRNA  Patient location: OR. Preanesthetic checklist: patient identified, IV checked, site marked, risks and benefits discussed, monitors and equipment checked, pre-op evaluation, timeout performed and anesthesia consent Emergency situation Left, radial was placed Catheter size: 20 G Hand hygiene performed  and maximum sterile barriers used   Attempts: 1 Procedure performed without using ultrasound guided technique. Ultrasound Notes:anatomy identified, needle tip was noted to be adjacent to the nerve/plexus identified and no ultrasound evidence of intravascular and/or intraneural injection Following insertion, dressing applied and Biopatch. Post procedure assessment: normal and unchanged  Patient tolerated the procedure well with no immediate complications.

## 2021-01-09 NOTE — ED Triage Notes (Signed)
Pt arrived via [facility] from home where he called friend/neighbor at midnight due to increased weakness and slurred speech. Pt reports he arrived home at 1400 on 10/17, laid down and then woke at 1800 and did not feel well, laid back down and woke at midnight with slurred speech and difficulty standing/ambulating. EMS at home sts pt stood up but would have fallen if not for assistance. LKW 1400, 10/17. Pt denies ETOH and substance abuse. No drift noted, bilateral grip strength equal and strong.

## 2021-01-09 NOTE — Anesthesia Preprocedure Evaluation (Addendum)
Anesthesia Evaluation  Patient identified by MRN, date of birth, ID band Patient awake    Reviewed: Allergy & Precautions, NPO status , Patient's Chart, lab work & pertinent test resultsPreop documentation limited or incomplete due to emergent nature of procedure.  History of Anesthesia Complications Negative for: history of anesthetic complications  Airway Mallampati: III  TM Distance: <3 FB Neck ROM: Full    Dental  (+) Teeth Intact, Dental Advisory Given   Pulmonary neg pulmonary ROS,           Cardiovascular hypertension, Pt. on medications and Pt. on home beta blockers + CAD, + Past MI, + CABG and +CHF    Study Conclusions   - Left ventricle: The cavity size was normal. There was mild  concentric hypertrophy. Systolic function was moderately reduced.  The estimated ejection fraction was in the range of 35% to 40%.  Hypokinesis of the basal-midanteroseptal, inferior, and  inferoseptal myocardium. Doppler parameters are consistent with  abnormal left ventricular relaxation (grade 1 diastolic  dysfunction). Doppler parameters are consistent with  indeterminate ventricular filling pressure.  - Aortic valve: Transvalvular velocity was within the normal range.  There was no stenosis. There was no regurgitation. Valve area  (VTI): 2.04 cm^2. Valve area (Vmax): 2.07 cm^2. Valve area  (Vmean): 1.97 cm^2.  - Mitral valve: Transvalvular velocity was within the normal range.  There was no evidence for stenosis. There was mild regurgitation.  - Right ventricle: The cavity size was normal. Wall thickness was  normal. Systolic function was normal.  - Atrial septum: No defect or patent foramen ovale was identified.  - Tricuspid valve: There was no regurgitation.   Impressions:   - LVEF significantly improved from 06/2016.    Neuro/Psych CVA negative psych ROS   GI/Hepatic Neg liver ROS, GERD  ,  Endo/Other   negative endocrine ROS  Renal/GU negative Renal ROS  negative genitourinary   Musculoskeletal negative musculoskeletal ROS (+)   Abdominal   Peds negative pediatric ROS (+)  Hematology negative hematology ROS (+)   Anesthesia Other Findings   Reproductive/Obstetrics negative OB ROS                            Anesthesia Physical Anesthesia Plan  ASA: 4 and emergent  Anesthesia Plan: General   Post-op Pain Management:    Induction: Intravenous  PONV Risk Score and Plan: 2 and Ondansetron and Dexamethasone  Airway Management Planned: Oral ETT  Additional Equipment: Arterial line  Intra-op Plan:   Post-operative Plan: Extubation in OR  Informed Consent: I have reviewed the patients History and Physical, chart, labs and discussed the procedure including the risks, benefits and alternatives for the proposed anesthesia with the patient or authorized representative who has indicated his/her understanding and acceptance.     Dental advisory given  Plan Discussed with: CRNA and Anesthesiologist  Anesthesia Plan Comments:         Anesthesia Quick Evaluation

## 2021-01-09 NOTE — Progress Notes (Signed)
PT Cancellation Note  Patient Details Name: Joshua [name] MRN: [id number] DOB: 01/22/1961   Cancelled Treatment:    Reason Eval/Treat Not Completed: Active bedrest order till midnight; will attempt another day.   Elray Mcgregor 01/09/2021, 1:22 PM Sheran Lawless, PT Acute Rehabilitation Services [phone] Office:[phone] 01/09/2021

## 2021-01-09 NOTE — ED Notes (Signed)
Tele-neuro on screen with Clinical research associate for report.

## 2021-01-09 NOTE — Progress Notes (Signed)
Spoke with IR MD due to Pt being maxed on cleviprex. RN instructed to continue monitoring and SBP goal  less than 170.

## 2021-01-09 NOTE — Transfer of Care (Signed)
Immediate Anesthesia Transfer of Care Note  Patient: Joshua [name]  Procedure(s) Performed: IR WITH ANESTHESIA  Patient Location: PACU and ICU  Anesthesia Type:General  Level of Consciousness: awake, alert , oriented and drowsy  Airway & Oxygen Therapy: Patient Spontanous Breathing and Patient connected to face mask oxygen  Post-op Assessment: Report given to RN and Post -op Vital signs reviewed and stable  Post vital signs: Reviewed and stable  Last Vitals:  Vitals Value Taken Time  BP    Temp    Pulse 71 01/09/21 1103  Resp 14 01/09/21 1103  SpO2 100 % 01/09/21 1103  Vitals shown include unvalidated device data.  Last Pain: There were no vitals filed for this visit.       Complications: No notable events documented.

## 2021-01-09 NOTE — Anesthesia Procedure Notes (Signed)
Procedure Name: Intubation Date/Time: 01/09/2021 6:35 AM Performed by: Claudina Lick, CRNA Pre-anesthesia Checklist: Patient identified, Emergency Drugs available, Suction available and Patient being monitored Patient Re-evaluated:Patient Re-evaluated prior to induction Oxygen Delivery Method: Circle system utilized Preoxygenation: Pre-oxygenation with 100% oxygen Induction Type: IV induction, Rapid sequence and Cricoid Pressure applied Laryngoscope Size: Miller and 2 Grade View: Grade I Tube type: Oral Tube size: 8.0 mm Number of attempts: 1 Airway Equipment and Method: Stylet Placement Confirmation: ETT inserted through vocal cords under direct vision, positive ETCO2 and breath sounds checked- equal and bilateral Secured at: 23 cm Tube secured with: Tape Dental Injury: Teeth and Oropharynx as per pre-operative assessment

## 2021-01-09 NOTE — Anesthesia Postprocedure Evaluation (Signed)
Anesthesia Post Note  Patient: Joshua [name]  Procedure(s) Performed: IR WITH ANESTHESIA     Patient location during evaluation: ICU Anesthesia Type: General Level of consciousness: awake and alert, patient cooperative and oriented Pain management: pain level controlled Vital Signs Assessment: post-procedure vital signs reviewed and stable Respiratory status: spontaneous breathing, nonlabored ventilation and respiratory function stable Cardiovascular status: stable and blood pressure returned to baseline Postop Assessment: no apparent nausea or vomiting (speech remains slurred, but RN states improving) Anesthetic complications: no   No notable events documented.  Last Vitals:  Vitals:   01/09/21 1600 01/09/21 1700  BP: (!) 143/78 125/76  Pulse: 70 73  Resp: 18 19  Temp: 36.7 C   SpO2: 97% 95%    Last Pain:  Vitals:   01/09/21 1600  TempSrc: Oral  PainSc: 0-No pain                 Kiyah Demartini,E. Chantell Kunkler

## 2021-01-09 NOTE — ED Provider Notes (Signed)
[facility] Emergency Department Provider Note   ____________________________________________   Event Date/Time   First MD Initiated Contact with Patient 01/09/21 0214     (approximate)  I have reviewed the triage vital signs and the nursing notes.   HISTORY  Chief Complaint Weakness    HPI Joshua [name] is a 60 y.o. male brought to the ED via EMS from home with a chief complaint of generalized weakness, speech difficulty and gait difficulty.  Patient with a history of CAD, hypertension, CHF who awoke at midnight with speech difficulty and gait instability.  Patient's roommate also noted speech difficulty and unsteady gait which is new for the patient.  Patient endorses generalized malaise.  Denies chest pain, shortness of breath, abdominal pain, nausea or vomiting.   Past Medical History:  Diagnosis Date   Acid reflux    Anasarca    Arthritis    SHOULDERS   CHF (congestive heart failure) (HCC)    Coronary artery disease 04/07/2013   cath by Dr. 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   LIMA to LAD, SVG to OM2, SVG to OM3, SVG to PDA, EVH via right thigh and bilateral lower legs    Patient Active Problem List   Diagnosis Date Noted   Chronic combined systolic and diastolic heart failure (HCC) 08/05/2016   Acute respiratory failure (HCC) 07/21/2016   Hypertensive emergency 07/21/2016   Acute kidney injury (HCC) 07/21/2016   Anasarca    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   Acid reflux 04/07/2013   Obesity, unspecified 04/07/2013   Coronary artery disease 04/07/2013   Kidney stone    Non-STEMI (non-ST elevated myocardial infarction) (HCC) 04/06/2013   Essential hypertension, benign 04/06/2013    Past Surgical History:   Procedure Laterality Date   CORONARY ARTERY BYPASS GRAFT N/A 04/09/2013   Procedure: CORONARY ARTERY BYPASS GRAFTING (CABG);  Surgeon: Purcell Nails, MD;  Location: [facility] OR;  Service: Open Heart Surgery;  Laterality: N/A;  CABG x four,  using left internal mammary artery and right and left leg greater saphenous vein harvested endoscopically   INTRAOPERATIVE TRANSESOPHAGEAL ECHOCARDIOGRAM N/A 04/09/2013   Procedure: INTRAOPERATIVE TRANSESOPHAGEAL ECHOCARDIOGRAM;  Surgeon: Purcell Nails, MD;  Location: [facility] OR;  Service: Open Heart Surgery;  Laterality: N/A;   RIGHT/LEFT HEART CATH AND CORONARY/GRAFT ANGIOGRAPHY N/A 07/26/2016   Procedure: Right/Left Heart Cath and Coronary/Graft Angiography;  Surgeon: Dolores Patty, MD;  Location: [facility] INVASIVE CV LAB;  Service: Cardiovascular;  Laterality: N/A;    Prior to Admission medications   Medication Sig Start Date End Date Taking? Authorizing Provider  aspirin 81 MG tablet Take 1 tablet (81 mg total) by mouth daily. 08/05/16   Graciella Freer, PA-C  atorvastatin (LIPITOR) 40 MG tablet TAKE 1 TABLET(40 MG) BY MOUTH DAILY AT 6 PM 12/24/18   Bensimhon, Bevelyn Buckles, MD  carvedilol (COREG) 6.25 MG tablet Take 1 tablet (6.25 mg total) by mouth 2 (two) times daily with a meal. 04/23/19   Bensimhon, Bevelyn Buckles, MD  furosemide (LASIX) 40 MG tablet TAKE 1 TABLET(40 MG) BY MOUTH DAILY 04/03/18   Bensimhon, Bevelyn Buckles, MD  sacubitril-valsartan (ENTRESTO) 97-103 MG Take 1 tablet by mouth 2 (two) times daily. Please schedule an appointment for further refills 04/23/19  Bensimhon, Bevelyn Buckles, MD  spironolactone (ALDACTONE) 25 MG tablet Take 0.5 tablets (12.5 mg total) by mouth daily. 09/02/16   Bensimhon, Bevelyn Buckles, MD    Allergies Patient has no known allergies.  Family History  Problem Relation Age of Onset   Cancer Mother    Coronary artery disease Father    Hypertension Father    Heart attack Paternal Uncle     Social History Social History   Tobacco Use    Smoking status: Never   Smokeless tobacco: Never   Tobacco comments:    iI  DIPPED TOBACCO AT ONE TOME"  Vaping Use   Vaping Use: Never used  Substance Use Topics   Alcohol use: No   Drug use: No    Review of Systems  Constitutional: No fever/chills Eyes: No visual changes. ENT: No sore throat. Cardiovascular: Denies chest pain. Respiratory: Denies shortness of breath. Gastrointestinal: No abdominal pain.  No nausea, no vomiting.  No diarrhea.  No constipation. Genitourinary: Negative for dysuria. Musculoskeletal: Negative for back pain. Skin: Negative for rash. Neurological: Positive for speech difficulty and gait instability.  Negative for headaches, focal weakness or numbness.   ____________________________________________   PHYSICAL EXAM:  VITAL SIGNS: ED Triage Vitals  Enc Vitals Group     BP      Pulse      Resp      Temp      Temp src      SpO2      Weight      Height      Head Circumference      Peak Flow      Pain Score      Pain Loc      Pain Edu?      Excl. in GC?     Constitutional: Alert and oriented. Well appearing and in mild acute distress. Eyes: Conjunctivae are normal. PERRL. EOMI. Head: Atraumatic. Nose: No congestion/rhinnorhea. Mouth/Throat: Mucous membranes are moist.   Neck: No stridor.   Cardiovascular: Normal rate, regular rhythm. Grossly normal heart sounds.  Good peripheral circulation. Respiratory: Normal respiratory effort.  No retractions. Lungs CTAB. Gastrointestinal: Soft and nontender to light or deep palpation. No distention. No abdominal bruits. No CVA tenderness. Musculoskeletal: No lower extremity tenderness nor edema.  No joint effusions. Neurologic: Alert and oriented x3.  Slurred speech and language. No gross focal neurologic deficits are appreciated.  Skin:  Skin is warm, dry and intact. No rash noted. Psychiatric: Mood and affect are normal. Speech and behavior are  normal.  ____________________________________________   LABS (all labs ordered are listed, but only abnormal results are displayed)  Labs Reviewed  CBC - Abnormal; Notable for the following components:      Result Value   WBC 11.2 (*)    RBC 5.91 (*)    Hemoglobin 18.1 (*)    HCT 53.0 (*)    All other components within normal limits  DIFFERENTIAL - Abnormal; Notable for the following components:   Neutro Abs 9.8 (*)    All other components within normal limits  COMPREHENSIVE METABOLIC PANEL - Abnormal; Notable for the following components:   Glucose, Bld 154 (*)    BUN 22 (*)    Total Protein 8.3 (*)    Total Bilirubin 1.3 (*)    All other components within normal limits  URINALYSIS, ROUTINE W REFLEX MICROSCOPIC - Abnormal; Notable for the following components:   Color, Urine YELLOW (*)    APPearance CLEAR (*)  Specific Gravity, Urine 1.044 (*)    Ketones, ur 5 (*)    Protein, ur 30 (*)    All other components within normal limits  CBG MONITORING, ED - Abnormal; Notable for the following components:   Glucose-Capillary 141 (*)    All other components within normal limits  RESP PANEL BY RT-PCR (FLU A&B, COVID) ARPGX2  PROTIME-INR  APTT  ETHANOL  URINE DRUG SCREEN, QUALITATIVE ([facility] ONLY)   ____________________________________________  EKG  ED ECG REPORT I, Gilman Olazabal J, the attending physician, personally viewed and interpreted this ECG.   Date: 01/09/2021  EKG Time: 0219  Rate: 69  Rhythm: normal sinus rhythm  Axis: Normal  Intervals:none  ST&T Change: Nonspecific  ____________________________________________  RADIOLOGY I, Rollande Thursby J, personally viewed and evaluated these images (plain radiographs) as part of my medical decision making, as well as reviewing the written report by the radiologist.  ED MD interpretation: CT head discussed with radiologist: No ICH; chest x-ray demonstrates no acute cardiopulmonary process; CTA head/neck discussed with  radiologist positive for bilateral vertebral artery thrombosis, distal basilar thrombus  Official radiology report(s): DG Chest Port 1 View  Result Date: 01/09/2021 CLINICAL DATA:  Increased weakness and slurred speech EXAM: PORTABLE CHEST 1 VIEW COMPARISON:  07/21/2016 FINDINGS: Cardiac shadow is enlarged. Postsurgical changes are again noted and stable. Lungs are clear bilaterally. No bony abnormality is seen. IMPRESSION: Stable cardiomegaly.  No acute abnormality noted. Electronically Signed   By: Alcide Clever M.D.   On: 01/09/2021 02:57   CT HEAD CODE STROKE WO CONTRAST  Result Date: 01/09/2021 CLINICAL DATA:  Code stroke.  Neuro deficit, weakness EXAM: CT HEAD WITHOUT CONTRAST TECHNIQUE: Contiguous axial images were obtained from the base of the skull through the vertex without intravenous contrast. COMPARISON:  04/07/2013 FINDINGS: Brain: No evidence of acute infarction, hemorrhage, cerebral edema, mass, mass effect, or midline shift. Ventricles and sulci are normal for age. No extra-axial fluid collection. Periventricular white matter changes, likely the sequela of chronic small vessel ischemic disease. Vascular: No hyperdense vessel or unexpected calcification. Skull: Normal. Negative for fracture or focal lesion. Sinuses/Orbits: No acute finding. Mild mucosal thickening in the sinuses. Other: The mastoid air cells are well aerated. ASPECTS [facility] Stroke Program Early CT Score) - Ganglionic level infarction (caudate, lentiform nuclei, internal capsule, insula, M1-M3 cortex): 7 - Supraganglionic infarction (M4-M6 cortex): 3 Total score (0-10 with 10 being normal): 10 IMPRESSION: 1. No acute intracranial process. 2. ASPECTS is 10 Code stroke imaging results were communicated on 01/09/2021 at 2:38 am to provider Dr. Dolores Frame Via telephone, who verbally acknowledged these results. Electronically Signed   By: Wiliam Ke M.D.   On: 01/09/2021 02:40   CT ANGIO HEAD NECK W WO CM (CODE  STROKE)  Addendum Date: 01/09/2021   ADDENDUM REPORT: 01/09/2021 05:01 ADDENDUM: Critical Value/emergent results were called by telephone at the time of interpretation on 01/09/2021 at 0437 hours to Dr. Lesly Rubenstein Ethelbert Thain , who verbally acknowledged these results. Electronically Signed   By: Odessa Fleming M.D.   On: 01/09/2021 05:01   Result Date: 01/09/2021 CLINICAL DATA:  60 year old male code stroke presentation. Weakness. EXAM: CT ANGIOGRAPHY HEAD AND NECK TECHNIQUE: Multidetector CT imaging of the head and neck was performed using the standard protocol during bolus administration of intravenous contrast. Multiplanar CT image reconstructions and MIPs were obtained to evaluate the vascular anatomy. Carotid stenosis measurements (when applicable) are obtained utilizing NASCET criteria, using the distal internal carotid diameter as the denominator. CONTRAST:  68mL OMNIPAQUE IOHEXOL  350 MG/ML SOLN COMPARISON:  Noncontrast head CT 0226 hours. FINDINGS: CTA NECK Skeleton: Prior sternotomy. Carious left posterior mandible dentition. Cervical spine degeneration. No acute osseous abnormality identified. Upper chest: Mediastinal lipomatosis. Prior CABG. Partially visible cardiomegaly. No evidence of pericardial effusion or mediastinal lymphadenopathy. Visible major airways appear patent, minor atelectasis. Other neck: Negative. Aortic arch: 3 vessel arch configuration. Minor arch atherosclerosis. Right carotid system: Mildly tortuous brachiocephalic artery and proximal right CCA without stenosis. Mild soft plaque proximal to the right carotid bifurcation without stenosis. Partially retropharyngeal course of the bifurcation and right ICA. Mild partially calcified atherosclerosis without stenosis. Left carotid system: Negative left CCA origin. Mildly tortuous left CCA with no significant plaque or stenosis. Partially retropharyngeal left carotid bifurcation and ICA. Mild mostly soft plaque of the proximal ICA without stenosis. Mild  tortuosity of the vessel in calcified plaque just below the skull base. Vertebral arteries: Mildly tortuous proximal right subclavian artery without stenosis. Normal right vertebral artery origin. Gradient of decreasing contrast opacification throughout the cervical right vertebral artery which is occluded in the V3 segment leading up to the skull base (series 7, image 161). Mild plaque in the proximal left subclavian artery without stenosis. Mild plaque at the left vertebral artery origin without stenosis. Asymmetrically better enhancement of the cervical left vertebral artery, however, the vessel is tapered and abruptly occluded at the distal V3 segment at the skull base on series 7, image 162. CTA HEAD Posterior circulation: Bilateral proximal vertebral artery V4 segments are occluded, but reconstituted at the PICA origins. Distal right V4 enhances somewhat better. Patent vertebrobasilar junction without stenosis. Patent proximal basilar artery. However, there is a 4-5 mm filling defect in the distal basilar extending to the level of the SCA origins compatible with acute thrombosis. See series 7, image 116 and series 8, image 123. Despite this the basilar tip and PCA origins remain patent. The right SCA is occluded. The left SCA remains patent. Posterior communicating arteries are diminutive or absent. Bilateral PCA branches are patent but irregular, with mild to moderate irregularity and stenosis which appears maximal at the right P3 bifurcation such as series 10, image 56. Anterior circulation: Both ICA siphons are patent. Bilateral siphon atherosclerosis is mild without stenosis. Normal ophthalmic artery origins. Patent carotid termini. Patent MCA and ACA origins. Right A1 appears somewhat dominant. Normal anterior communicating artery. Bilateral ACA branches are within normal limits. Left MCA M1 segment and trifurcation are patent without stenosis. Right MCA M1 segment and bifurcation are patent without  stenosis. Bilateral MCA branches are mildly irregular. No MCA occlusion is identified. Venous sinuses: Early contrast timing, grossly patent. Anatomic variants: Mildly dominant right A1. Review of the MIP images confirms the above findings IMPRESSION: 1. 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 Both vertebral arteries occlude at the skull base. And there is a a 4-5 mm segment of thrombus at the Distal Basilar at the level of the SCAs. The Right SCA origin is occluded. But the Basilar tip and PCAs remain patent. There is mild to moderate irregularity and stenosis of the distal PCAs greater on the right. 2. Bilateral carotid artery atherosclerosis without hemodynamically significant stenosis. Mild intracranial atherosclerosis. 3. Prior CABG.  Mild aortic arch atherosclerosis. Electronically Signed: By: Odessa Fleming M.D. On: 01/09/2021 04:33    ____________________________________________   PROCEDURES  Procedure(s) performed (including Critical Care):  .1-3 Lead EKG Interpretation Performed by: Irean Hong, MD Authorized by: Irean Hong, MD  Interpretation: normal     ECG rate:  75   ECG rate assessment: normal     Rhythm: sinus rhythm     Ectopy: none     Conduction: normal   Comments:     Patient placed on cardiac monitor to evaluate for arrhythmias  NIH Stroke Scale  Interval: Baseline Time: 5:38 AM Person Administering Scale: [place] J  Administer stroke scale items in the order listed. Record performance in each category after each subscale exam. Do not go back and change scores. Follow directions provided for each exam technique. Scores should reflect what the patient does, not what the clinician thinks the patient can do. The clinician should record answers while administering the exam and work quickly. Except where indicated, the patient should not be coached (i.e., repeated requests to patient to make a special  effort).   1a  Level of consciousness: 0=alert; keenly responsive  1b. LOC questions:  0=Performs both tasks correctly  1c. LOC commands: 0=Performs both tasks correctly  2.  Best Gaze: 0=normal  3.  Visual: 0=No visual loss  4. Facial Palsy: 0=Normal symmetric movement  5a.  Motor left arm: 0=No drift, limb holds 90 (or 45) degrees for full 10 seconds  5b.  Motor right arm: 0=No drift, limb holds 90 (or 45) degrees for full 10 seconds  6a. motor left leg: 0=No drift, limb holds 90 (or 45) degrees for full 10 seconds  6b  Motor right leg:  0=No drift, limb holds 90 (or 45) degrees for full 10 seconds  7. Limb Ataxia: 0=Absent  8.  Sensory: 0=Normal; no sensory loss  9. Best Language:  1=Mild to moderate aphasia; some obvious loss of fluency or facility of comprehension without significant limitation on ideas expressed or form of expression.  10. Dysarthria: 1=Mild to moderate, patient slurs at least some words and at worst, can be understood with some difficulty  11. Extinction and Inattention: 0=No abnormality  12. Distal motor function: 0=Normal   Total:   2     CRITICAL CARE Performed by: Irean Hong   Total critical care time: 60 minutes  Critical care time was exclusive of separately billable procedures and treating other patients.  Critical care was necessary to treat or prevent imminent or life-threatening deterioration.  Critical care was time spent personally by me on the following activities: development of treatment plan with patient and/or surrogate as well as nursing, discussions with consultants, evaluation of patient's response to treatment, examination of patient, obtaining history from patient or surrogate, ordering and performing treatments and interventions, ordering and review of laboratory studies, ordering and review of radiographic studies, pulse oximetry and re-evaluation of patient's condition.  ____________________________________________   INITIAL  IMPRESSION / ASSESSMENT AND PLAN / ED COURSE  As part of my medical decision making, I reviewed the following data within the electronic MEDICAL RECORD NUMBER Nursing notes reviewed and incorporated, Labs reviewed, EKG interpreted, Old chart reviewed, Radiograph reviewed, Discussed with admitting physician, A consult was requested and obtained from this/these consultant(s) Neurology, and Notes from prior ED visits     60 year old male presenting with slurred speech, dysarthria and gait instability.  Differential diagnosis includes but is not limited to CVA, TIA, ACS, infectious, metabolic etiologies, etc.  Code stroke initiated.  Patient sent to CT scan.  Teleneurology consulted.  Clinical Course as of 01/09/21 0538  Tue Jan 09, 2021  0254 Patient able to clarify history that he was not feeling well when he returned home from work  at 2 PM yesterday.  Took a nap and awoke around 6 PM.  Went back to sleep and awoke at midnight with speech and gait difficulties.  Last known well around 6 PM. [JS]  0259 Spoke with Dr. Izola Price from teleneurology who elicited a last well known time of 9 PM which places patient outside the window of [facility].  Recommends 300 mg Plavix load, CTA head/neck and admission for stroke work-up. [JS]  0305 Plavix held due to patient failed swallow screen. [JS]  [phone] Spoke again with Dr. Izola Price from neurology regarding positive CTA head/neck.  He will contact interventional radiology and give me a call back. [JS]  3818 Patient arranged to be transported via [facility] to [facility] where he will undergo further diagnostic testing with the hopes of neurological intervention via IR.  Neurology recommends 300 mg aspirin PR, head of bed flat, IV fluids.  Updated patient who is agreeable to plan of care. 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    Irean Hong, MD 01/09/21 [phone]

## 2021-01-09 NOTE — Progress Notes (Signed)
STROKE TEAM PROGRESS NOTE   SUBJECTIVE (INTERVAL HISTORY) His RN is at the bedside. He is lying in bed flat with right groin puncture. Awake alert and following commands, moderate to severe dysarthria but moving all extremities. S/p IR with b/l VA and BA revascularization. On Cangrelor now, pending CT in 1:30pm.    OBJECTIVE Temp:  [98.1 F (36.7 C)-98.2 F (36.8 C)] 98.2 F (36.8 C) (10/18 0529) Pulse Rate:  [62-78] 66 (10/18 1230) Resp:  [12-19] 12 (10/18 1230) BP: (127-203)/(78-112) 137/79 (10/18 1230) SpO2:  [94 %-100 %] 97 % (10/18 1230) Arterial Line BP: (157-172)/(70-78) 170/75 (10/18 1230)  Recent Labs  Lab 01/09/21 0217  GLUCAP 141*   Recent Labs  Lab 01/09/21 0240  NA 137  K 4.2  CL 101  CO2 25  GLUCOSE 154*  BUN 22*  CREATININE 1.02  CALCIUM 9.4   Recent Labs  Lab 01/09/21 0240  AST 20  ALT 20  ALKPHOS 54  BILITOT 1.3*  PROT 8.3*  ALBUMIN 4.9   Recent Labs  Lab 01/09/21 0240  WBC 11.2*  NEUTROABS 9.8*  HGB 18.1*  HCT 53.0*  MCV 89.7  PLT 202   No results for input(s): CKTOTAL, CKMB, CKMBINDEX, TROPONINI in the last 168 hours. Recent Labs    01/09/21 0240  LABPROT 13.7  INR 1.1   Recent Labs    01/09/21 0515  COLORURINE YELLOW*  LABSPEC 1.044*  PHURINE 5.0  GLUCOSEU NEGATIVE  HGBUR NEGATIVE  BILIRUBINUR NEGATIVE  KETONESUR 5*  PROTEINUR 30*  NITRITE NEGATIVE  LEUKOCYTESUR NEGATIVE       Component Value Date/Time   CHOL 118 07/21/2016 0903   TRIG 83 07/21/2016 0903   HDL 20 (L) 07/21/2016 0903   CHOLHDL 5.9 07/21/2016 0903   VLDL 17 07/21/2016 0903   LDLCALC 81 07/21/2016 0903   Lab Results  Component Value Date   HGBA1C 5.8 (H) 07/24/2016      Component Value Date/Time   LABOPIA NONE DETECTED 01/09/2021 0515   COCAINSCRNUR NONE DETECTED 01/09/2021 0515   LABBENZ NONE DETECTED 01/09/2021 0515   AMPHETMU NONE DETECTED 01/09/2021 0515   THCU NONE DETECTED 01/09/2021 0515   LABBARB NONE DETECTED 01/09/2021 0515     Recent Labs  Lab 01/09/21 0240  ETH <10    I have personally reviewed the radiological images below and agree with the radiology interpretations.  DG Chest Port 1 View  Result Date: 01/09/2021 CLINICAL DATA:  Increased weakness and slurred speech EXAM: PORTABLE CHEST 1 VIEW COMPARISON:  07/21/2016 FINDINGS: Cardiac shadow is enlarged. Postsurgical changes are again noted and stable. Lungs are clear bilaterally. No bony abnormality is seen. IMPRESSION: Stable cardiomegaly.  No acute abnormality noted. Electronically Signed   By: Alcide Clever M.D.   On: 01/09/2021 02:57   CT HEAD CODE STROKE WO CONTRAST  Result Date: 01/09/2021 CLINICAL DATA:  Code stroke.  Neuro deficit, weakness EXAM: CT HEAD WITHOUT CONTRAST TECHNIQUE: Contiguous axial images were obtained from the base of the skull through the vertex without intravenous contrast. COMPARISON:  04/07/2013 FINDINGS: Brain: No evidence of acute infarction, hemorrhage, cerebral edema, mass, mass effect, or midline shift. Ventricles and sulci are normal for age. No extra-axial fluid collection. Periventricular white matter changes, likely the sequela of chronic small vessel ischemic disease. Vascular: No hyperdense vessel or unexpected calcification. Skull: Normal. Negative for fracture or focal lesion. Sinuses/Orbits: No acute finding. Mild mucosal thickening in the sinuses. Other: The mastoid air cells are well aerated. ASPECTS [facility] Stroke Program  Early CT Score) - Ganglionic level infarction (caudate, lentiform nuclei, internal capsule, insula, M1-M3 cortex): 7 - Supraganglionic infarction (M4-M6 cortex): 3 Total score (0-10 with 10 being normal): 10 IMPRESSION: 1. No acute intracranial process. 2. ASPECTS is 10 Code stroke imaging results were communicated on 01/09/2021 at 2:38 am to provider Dr. Dolores Frame Via telephone, who verbally acknowledged these results. Electronically Signed   By: Wiliam Ke M.D.   On: 01/09/2021 02:40   CT ANGIO HEAD  NECK W WO CM (CODE STROKE)  Addendum Date: 01/09/2021   ADDENDUM REPORT: 01/09/2021 05:01 ADDENDUM: Critical Value/emergent results were called by telephone at the time of interpretation on 01/09/2021 at 0437 hours to Dr. Lesly Rubenstein SUNG , who verbally acknowledged these results. Electronically Signed   By: Odessa Fleming M.D.   On: 01/09/2021 05:01   Result Date: 01/09/2021 CLINICAL DATA:  60 year old male code stroke presentation. Weakness. EXAM: CT ANGIOGRAPHY HEAD AND NECK TECHNIQUE: Multidetector CT imaging of the head and neck was performed using the standard protocol during bolus administration of intravenous contrast. Multiplanar CT image reconstructions and MIPs were obtained to evaluate the vascular anatomy. Carotid stenosis measurements (when applicable) are obtained utilizing NASCET criteria, using the distal internal carotid diameter as the denominator. CONTRAST:  75mL OMNIPAQUE IOHEXOL 350 MG/ML SOLN COMPARISON:  Noncontrast head CT 0226 hours. FINDINGS: CTA NECK Skeleton: Prior sternotomy. Carious left posterior mandible dentition. Cervical spine degeneration. No acute osseous abnormality identified. Upper chest: Mediastinal lipomatosis. Prior CABG. Partially visible cardiomegaly. No evidence of pericardial effusion or mediastinal lymphadenopathy. Visible major airways appear patent, minor atelectasis. Other neck: Negative. Aortic arch: 3 vessel arch configuration. Minor arch atherosclerosis. Right carotid system: Mildly tortuous brachiocephalic artery and proximal right CCA without stenosis. Mild soft plaque proximal to the right carotid bifurcation without stenosis. Partially retropharyngeal course of the bifurcation and right ICA. Mild partially calcified atherosclerosis without stenosis. Left carotid system: Negative left CCA origin. Mildly tortuous left CCA with no significant plaque or stenosis. Partially retropharyngeal left carotid bifurcation and ICA. Mild mostly soft plaque of the proximal ICA  without stenosis. Mild tortuosity of the vessel in calcified plaque just below the skull base. Vertebral arteries: Mildly tortuous proximal right subclavian artery without stenosis. Normal right vertebral artery origin. Gradient of decreasing contrast opacification throughout the cervical right vertebral artery which is occluded in the V3 segment leading up to the skull base (series 7, image 161). Mild plaque in the proximal left subclavian artery without stenosis. Mild plaque at the left vertebral artery origin without stenosis. Asymmetrically better enhancement of the cervical left vertebral artery, however, the vessel is tapered and abruptly occluded at the distal V3 segment at the skull base on series 7, image 162. CTA HEAD Posterior circulation: Bilateral proximal vertebral artery V4 segments are occluded, but reconstituted at the PICA origins. Distal right V4 enhances somewhat better. Patent vertebrobasilar junction without stenosis. Patent proximal basilar artery. However, there is a 4-5 mm filling defect in the distal basilar extending to the level of the SCA origins compatible with acute thrombosis. See series 7, image 116 and series 8, image 123. Despite this the basilar tip and PCA origins remain patent. The right SCA is occluded. The left SCA remains patent. Posterior communicating arteries are diminutive or absent. Bilateral PCA branches are patent but irregular, with mild to moderate irregularity and stenosis which appears maximal at the right P3 bifurcation such as series 10, image 56. Anterior circulation: Both ICA siphons are patent. Bilateral siphon atherosclerosis is mild without stenosis.  Normal ophthalmic artery origins. Patent carotid termini. Patent MCA and ACA origins. Right A1 appears somewhat dominant. Normal anterior communicating artery. Bilateral ACA branches are within normal limits. Left MCA M1 segment and trifurcation are patent without stenosis. Right MCA M1 segment and bifurcation  are patent without stenosis. Bilateral MCA branches are mildly irregular. No MCA occlusion is identified. Venous sinuses: Early contrast timing, grossly patent. Anatomic variants: Mildly dominant right A1. Review of the MIP images confirms the above findings IMPRESSION: 1. 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 Both vertebral arteries occlude at the skull base. And there is a a 4-5 mm segment of thrombus at the Distal Basilar at the level of the SCAs. The Right SCA origin is occluded. But the Basilar tip and PCAs remain patent. There is mild to moderate irregularity and stenosis of the distal PCAs greater on the right. 2. Bilateral carotid artery atherosclerosis without hemodynamically significant stenosis. Mild intracranial atherosclerosis. 3. Prior CABG.  Mild aortic arch atherosclerosis. Electronically Signed: By: Odessa Fleming M.D. On: 01/09/2021 04:33      PHYSICAL EXAM  Temp:  [98.1 F (36.7 C)-98.2 F (36.8 C)] 98.2 F (36.8 C) (10/18 0529) Pulse Rate:  [62-78] 66 (10/18 1230) Resp:  [12-19] 12 (10/18 1230) BP: (127-203)/(78-112) 137/79 (10/18 1230) SpO2:  [94 %-100 %] 97 % (10/18 1230) Arterial Line BP: (157-172)/(70-78) 170/75 (10/18 1230)  General - Well nourished, well developed, in no apparent distress, mildly anxious.  Ophthalmologic - fundi not visualized due to noncooperation.  Cardiovascular - Regular rhythm and rate.  Neuro - awake, alert, eyes open, orientated to age, place, month, but not to year. No aphasia, but kept asking the same questions, and stat the same statement, able to name and repeat, following all simple commands. Moderate to severe dysarthria. No gaze palsy, however, with lateral gaze showed b/l bidirectional nystagmus, no disconjugate eyes. tracking bilaterally, visual field full, PERRL. No significant facial droop. Tongue midline. Bilateral UEs 5/5, no drift. Bilaterally LEs equal strength.  Sensation seems decreased on the left slightly, b/l FTN intact grossly, gait not tested.     ASSESSMENT/PLAN Mr. Joshua [name] is a 60 y.o. male with history of CHF, CAD/NSTEMI s/p CABG, HTN, obesity admitted for speech difficulty, b/l LE weakness and gait difficulty. No tPA given due to outside window.    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 Cardioembolic source is also in DDx CT no acute finding CTA head and neck - b/l V3 and V4 occlusion, BA thrombus at SCA level with right SCA occlusion S/p IR occluded bilateral VBJs  and BA with TICI2c and right VBJ stenting CT repeat suspect small right cerebellar infarct MRI  pending MRA  pending 2D Echo pending LDL pending  HgbA1c pending  SCDs for VTE prophylaxis aspirin 81 mg daily prior to admission, now on aspirin 81 mg daily and Brilinta (ticagrelor) 90 mg bid. Ongoing aggressive stroke risk factor management Therapy recommendations:  pending  Disposition:  pending  CHF  Home meds - coreg, entresto, lasix, spironolactone TTE pending Will resume home meds once pass swallow Gentle hydration  Hypertension Unstable On cleviprex BP goal 120-140 within 24h of procedure Resume po meds once pass swallow Long term BP goal normotensive  Hyperlipidemia Home meds:  lipitor 40  LDL pending, goal < 70 Will resume statin once po access Continue statin at discharge  Dysphagia  NPO  now Pending swallow eval On gentle IVF Speech on board  Other Stroke Risk Factors Obesity, There is no height or weight on file to calculate BMI.  Coronary artery disease s/p CABG  Other Active Problems Leukocytosis WBC 11.2 Polycythemia Hb 18.1  Hospital day # 0  This patient is critically ill due to posterior circulation infarcts with b/l VA and BA occlusion s/p IR, hypertensive emergency, CHF, dysphagia and at significant risk of neurological worsening, death  form recurrent stroke, hemorrhagic conversion, heart failure. This patient's care requires constant monitoring of vital signs, hemodynamics, respiratory and cardiac monitoring, review of multiple databases, neurological assessment, discussion with family, other specialists and medical decision making of high complexity. I spent 30 minutes of neurocritical care time in the care of this patient.    Marvel Plan, MD PhD Stroke Neurology 01/09/2021 12:45 PM    To contact Stroke Continuity provider, please refer to [email or web address]. After hours, contact General Neurology

## 2021-01-09 NOTE — ED Notes (Signed)
Code [place] called to [employer]) at [employer] per Dolores Frame, MD

## 2021-01-09 NOTE — H&P (Signed)
Neurology H&P  Joshua [name] MR# [id number] 01/09/2021   CC: basilar artery occlusion  History is obtained from: ED staff and chart.  HPI: Joshua [name] is a 60 y.o. male PMHx as reviewed below CAD, HTN, CHF who awoke at midnight with speech difficulty and gait instability/bilateral lower extremity weakness and NIHSS 3  transferred from OSH after CTA showed basilar artery occlusion.  The patient arrived directly to neuro IR and was not able to    LKW: 21:00 tNK given: No low NIHSS IR Thrombectomy yes Modified Rankin Scale: 0-Completely asymptomatic and back to baseline post- stroke NIHSS: 3 from outside hospital.   ROS: At outside hospital Denies chest pain, shortness of breath, abdominal pain, nausea or vomiting. Unable to assess due to encephalopathy.  Past Medical History:  Diagnosis Date   Acid reflux    Anasarca    Arthritis    SHOULDERS   CHF (congestive heart failure) (HCC)    Coronary artery disease 04/07/2013   cath by Dr. 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 He has never used smokeless tobacco. He reports that he does not drink alcohol and does not use drugs.  Prior to Admission medications   Medication Sig Start Date End Date Taking? Authorizing Provider  aspirin 81 MG tablet Take 1 tablet (81 mg total) by mouth daily. 08/05/16   Graciella Freer, PA-C  atorvastatin (LIPITOR) 40 MG tablet TAKE 1 TABLET(40 MG) BY MOUTH DAILY AT 6  PM 12/24/18   Bensimhon, Bevelyn Buckles, MD  carvedilol (COREG) 6.25 MG tablet Take 1 tablet (6.25 mg total) by mouth 2 (two) times daily with a meal. 04/23/19   Bensimhon, Bevelyn Buckles, MD  furosemide (LASIX) 40 MG tablet TAKE 1 TABLET(40 MG) BY MOUTH DAILY 04/03/18   Bensimhon, Bevelyn Buckles, MD  sacubitril-valsartan (ENTRESTO) 97-103 MG Take 1 tablet by mouth 2 (two) times daily. Please schedule an appointment for further refills 04/23/19   Bensimhon, Bevelyn Buckles, MD  spironolactone (ALDACTONE) 25 MG tablet Take 0.5 tablets (12.5 mg total) by mouth daily. 09/02/16   Bensimhon, Bevelyn Buckles, MD   Exam: Current vital signs: There were no vitals taken for this visit.  Physical Exam  The patient was sedated in the interventional suite and was not able to be examined.  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 Both vertebral arteries occlude at the skull base. And there is a a 4-5 mm segment of thrombus at the Distal Basilar at the level of the SCAs. The Right SCA origin is occluded. But the Basilar tip and PCAs remain patent. There is mild to moderate irregularity and stenosis of the  distal PCAs greater on the right. Bilateral carotid artery atherosclerosis without hemodynamically significant stenosis. Mild intracranial atherosclerosis.  Assessment: Joshua [name] is a 60 y.o. 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 The angiogram was reviewed and the patient was discussed with Dr. Corliss Skains and plan for  intervention. On arrival, the patient was taken directly to neuro IR. Report was the patient was alert and talking prior to sedation for procedure.    Plan: Admit to NICU  for post thrombectomy management per protocol. Close surveillance of neurological status. Hip straight x 6 hrs Monitor groin puncture site for pulse quality, limb temperature, signs of neurovascular compromise.  Antiplatelets once cangrelor is discontinued:   - Load ticagrelor 180mg  then tomorrow continue 90mg  two times daily.  - Aspirin 81mg  daily.  Blood pressure: MAP >65 SBP 120-160: - Start nicardipine infusion may increase to maximum 8mcg/min as needed to maintain SBP<120 -160. - Labetalol 20mg  every minutes as needed if SBP>140.   Labs: type and cross, CBC, CMP, Mg, phos, troponin, HbA1c, lipid panel, TSH.  Maintain O2 sats > 94% Normothermia - For temperature >37.5C - acetaminophen 650mg  q4-6 hours PRN. Gentle IV hydration. CT head 6 hours post procedure or per protocol.  MRI brain without contrast when able. Relative euglycemia and treat if hyperglycemia (>200 mg/dL)/hypoglycemia (< 60mg /dL). TTE. Statin if LDL > 70 If the patient received intra-arterial tPA would prefer to hold antiplatelet therapy for now. Telemetry monitoring for arrhythmia. Recommend Stroke education. Recommend PT/OT/SLP consult. If there is acute neurologic decline STAT CT head.   PPx:     GI - H2, docusate 400mg  qhs.     DVT - heparin subq and SCDs. Precautions: Aspiration/seizure/fall   Electronically signed by:  , MD Page: 01/09/2021, 6:52 AM

## 2021-01-09 NOTE — ED Notes (Signed)
Patient failed swallow screen, PO Plavix held, MD made aware

## 2021-01-09 NOTE — Consult Note (Addendum)
[facility] Tele[facility]urology Consult Services   Patient Name:   Joshua [name], Joshua [name] Date of Birth:   1960/04/07 Identification Number:   MRN - [id number] Date of Service:   01/09/2021 02:28:27  Diagnosis:       I63.9 - Cerebrovascular accident (CVA), unspecified mechanism (HCC)  Impression:      Joshua [name] is a 60 yo gentleman with pmh of HTN, CAd, CABG, and CHF who presents via EMS with slurred speech and B/L leg weakness. LKW is out of the TPA window. NIH 3. CTH shows no acute findings. CTA short segment b/l vert occlusion with distal reperfusion, short segment acute basilar occlusion with distal reperfusion. Acute ischemic stroke. Recommend emergent transfer for possible mechanical thrombectomy and inpatient stroke workup.  Metrics: Last Known Well: 01/08/2021 21:00:00 [facility] Notification Time: 01/09/2021 77:82:42 Arrival Time: 01/09/2021 02:11:00 Stamp Time: 01/09/2021 02:28:27 Initial Response Time: 01/09/2021 02:29:44 Symptoms: slurred speech B/L leg weakness. NIHSS Start Assessment Time: 01/09/2021 02:33:07 Patient is not a candidate for Thrombolytic. Thrombolytic Medical Decision: 01/09/2021 02:33:23 Patient was not deemed candidate for Thrombolytic because of following reasons: Last Well Known Above 4.5 Hours.  CT head was reviewed and results were: I personally Reviewed the CT Head and it Showed No Acute Hemorrhage or Acute Core Infarct  ED Physician notified of diagnostic impression and management plan on 01/09/2021 02:53:45  Advanced Imaging: CTA Head and Neck Completed.  LVO:Yes  Discussed with NIR:Yes  Discussed with NIR Time:01/09/2021 04:57:57  Discussed with NIR Text:  Discussed with NIR Dr. Thomasena Edis, requested patient be transferred [facility] ER for CTP and possible mechanical thrombectomy. Requested patient get aspirin 300mg  rectally and that we have HOB flat.   Our recommendations are outlined  below.  Recommendations:        Transfer to [facility]        Neuro Checks [phone]       Bedside Swallow Eval       DVT Prophylaxis       Stat IV Fluids, Normal Saline       Head of Bed 30 Degrees       Euglycemia and Avoid Hyperthermia (PRN Acetaminophen)       Rectal aspirin 300mg  stat        Antihypertensives PRN if Blood pressure is greater than 220/120 or there is a concern for End organ damage/contraindications for permissive HTN. If blood pressure is greater than 220/120 give labetalol PO or IV or Vasotec IV with a goal of 15% reduction in BP during the first 24 hours.       MRI brain wo       Echocardiogram.       PT/OT/Speech consults       lipid panel       a1c  Routine Consultation with Inhouse Neurology for Follow up Care  Sign Out:       Discussed with Emergency Department Provider    ------------------------------------------------------------------------------  History of Present Illness: Patient is a 60 year old Male.  Patient was brought by EMS for symptoms of slurred speech B/L leg weakness.  Joshua [name] is a 60 yo gentleman with pmh of HTN, CAD, CABG, and CHF who presents via EMS with slurred speech and B/L leg weakness. He says she loses his balance when he tries to walk. says he was talking normally at 6 pm and had developed "funny walking " and speech problems at 9 pm.   Past Medical History:      Hypertension  Coronary Artery Disease      There is NO history of Diabetes Mellitus      There is NO history of Hyperlipidemia      There is NO history of Atrial Fibrillation      There is NO history of Stroke      CHF, CABG  Social History: Smoking: No Alcohol Use: No Drug Use: No  Review of System:  14 Points Review of Systems was performed and was negative except mentioned in HPI.  Anticoagulant use:  No  Antiplatelet use: Yes Aspirin 81 mg  Allergies:  Reviewed    Examination: BP(203/111), Pulse(73), Blood Glucose(141) 1A:  Level of Consciousness - Alert; keenly responsive + 0 1B: Ask Month and Age - Both Questions Right + 0 1C: Blink Eyes & Squeeze Hands - Performs Both Tasks + 0 2: Test Horizontal Extraocular Movements - Normal + 0 3: Test Visual Fields - No Visual Loss + 0 4: Test Facial Palsy (Use Grimace if Obtunded) - Normal symmetry + 0 5A: Test Left Arm Motor Drift - No Drift for 10 Seconds + 0 5B: Test Right Arm Motor Drift - No Drift for 10 Seconds + 0 6A: Test Left Leg Motor Drift - No Drift for 5 Seconds + 0 6B: Test Right Leg Motor Drift - No Drift for 5 Seconds + 0 7: Test Limb Ataxia (FNF/Heel-Shin) - No Ataxia + 0 8: Test Sensation - Normal; No sensory loss + 0 9: Test Language/Aphasia - Mild-Moderate Aphasia: Some Obvious Changes, Without Significant Limitation + 1 10: Test Dysarthria - Severe Dysarthria: Unintelligble Slurring or Out of Proportion to Aphasia + 2 11: Test Extinction/Inattention - No abnormality + 0  NIHSS Score: 3  NIHSS Free Text : able to describe the picture, some fluent aphasia  Pre-Morbid Modified Rankin Scale: 0 Points = No symptoms at all   Patient/Family was informed the Neurology Consult would occur via TeleHealth consult by way of interactive audio and video telecommunications and consented to receiving care in this manner.   Patient is being evaluated for possible acute neurologic impairment and high probability of imminent or life-threatening deterioration. I spent total of 30 minutes providing care to this patient, including time for face to face visit via telemedicine, review of medical records, imaging studies and discussion of findings with providers, the patient and/or family.   Dr Marijo File   [facility] [phone]  Case [id number]

## 2021-01-09 NOTE — Procedures (Signed)
S/P 4 vessel cerebral artreriogram RT rad and RT CFA approach. Findings. 1.Occluded bilateral VBJs  and basilar arterry.  S/P complete revascularization of occluded basilar artery and RT VBJ with x 1 pass with 16mm x 40 mm solitaire X retriever and aspiration achioeving a TICI 2C revascularization . S/Pstent assisted angioplasty of underlying high grade stenosis of RT VBJ  Post CT no ICH Hemostasis at RT CFA site with manual compression.Distal pulses dopplerable. Extubated.  Maintaining O2 sats. Opens eyes and moves both LEs and RT arm to command. Pupils 2 mm Rt = Lt . Meds brilinta 180 mg via OG prior to placement of stent. Loading dose of cangrelor IV prior to stent placement . Started on 4 hr infusion IV  to stop at 135 pm followed by CT of the brain without contrast. S.Tomara Youngberg MD

## 2021-01-09 NOTE — ED Notes (Signed)
Pt to CT

## 2021-01-10 ENCOUNTER — Encounter (HOSPITAL_COMMUNITY): Payer: Self-pay | Admitting: Interventional Radiology

## 2021-01-10 ENCOUNTER — Inpatient Hospital Stay (HOSPITAL_COMMUNITY): Payer: BC Managed Care – PPO

## 2021-01-10 DIAGNOSIS — I6389 Other cerebral infarction: Secondary | ICD-10-CM | POA: Diagnosis not present

## 2021-01-10 LAB — CBC WITH DIFFERENTIAL/PLATELET
Abs Immature Granulocytes: 0.05 10*3/uL (ref 0.00–0.07)
Basophils Absolute: 0 10*3/uL (ref 0.0–0.1)
Basophils Relative: 0 %
Eosinophils Absolute: 0 10*3/uL (ref 0.0–0.5)
Eosinophils Relative: 0 %
HCT: 47.4 % (ref 39.0–52.0)
Hemoglobin: 16.2 g/dL (ref 13.0–17.0)
Immature Granulocytes: 1 %
Lymphocytes Relative: 16 %
Lymphs Abs: 1.4 10*3/uL (ref 0.7–4.0)
MCH: 30.5 pg (ref 26.0–34.0)
MCHC: 34.2 g/dL (ref 30.0–36.0)
MCV: 89.1 fL (ref 80.0–100.0)
Monocytes Absolute: 0.8 10*3/uL (ref 0.1–1.0)
Monocytes Relative: 9 %
Neutro Abs: 6.5 10*3/uL (ref 1.7–7.7)
Neutrophils Relative %: 74 %
Platelets: 196 10*3/uL (ref 150–400)
RBC: 5.32 MIL/uL (ref 4.22–5.81)
RDW: 14.8 % (ref 11.5–15.5)
WBC: 8.7 10*3/uL (ref 4.0–10.5)
nRBC: 0 % (ref 0.0–0.2)

## 2021-01-10 LAB — ECHOCARDIOGRAM COMPLETE
Area-P 1/2: 3.27 cm2
Calc EF: 38.5 %
S' Lateral: 4.6 cm
Single Plane A2C EF: 39.9 %
Single Plane A4C EF: 34.4 %

## 2021-01-10 LAB — LIPID PANEL
Cholesterol: 194 mg/dL (ref 0–200)
HDL: 30 mg/dL — ABNORMAL LOW (ref >40)
LDL Cholesterol: 123 mg/dL — ABNORMAL HIGH (ref 0–99)
Total CHOL/HDL Ratio: 6.5 RATIO
Triglycerides: 205 mg/dL — ABNORMAL HIGH (ref <150)
VLDL: 41 mg/dL — ABNORMAL HIGH (ref 0–40)

## 2021-01-10 LAB — BASIC METABOLIC PANEL
Anion gap: 13 (ref 5–15)
BUN: 13 mg/dL (ref 6–20)
CO2: 21 mmol/L — ABNORMAL LOW (ref 22–32)
Calcium: 8.8 mg/dL — ABNORMAL LOW (ref 8.9–10.3)
Chloride: 102 mmol/L (ref 98–111)
Creatinine, Ser: 1.08 mg/dL (ref 0.61–1.24)
GFR, Estimated: >60 mL/min (ref >60)
Glucose, Bld: 104 mg/dL — ABNORMAL HIGH (ref 70–99)
Potassium: 3.8 mmol/L (ref 3.5–5.1)
Sodium: 136 mmol/L (ref 135–145)

## 2021-01-10 LAB — HEMOGLOBIN A1C
Hgb A1c MFr Bld: 5.3 % (ref 4.8–5.6)
Mean Plasma Glucose: 105.41 mg/dL

## 2021-01-10 MED ORDER — PERFLUTREN LIPID MICROSPHERE
1.0000 mL | INTRAVENOUS | Status: AC | PRN
  Administered 2021-01-10: 5 mL via INTRAVENOUS
  Filled 2021-01-10: qty 10

## 2021-01-10 MED ORDER — LABETALOL HCL 5 MG/ML IV SOLN: 5.0000 mg | INTRAVENOUS | Status: DC | PRN

## 2021-01-10 MED ORDER — ATORVASTATIN CALCIUM 80 MG PO TABS
80.0000 mg | ORAL_TABLET | Freq: Every day | ORAL | Status: DC
  Administered 2021-01-11 – 2021-01-13 (×3): 80 mg via ORAL
  Filled 2021-01-10 (×4): qty 1

## 2021-01-10 MED ORDER — CHLORHEXIDINE GLUCONATE CLOTH 2 % EX PADS
6.0000 | MEDICATED_PAD | Freq: Every day | CUTANEOUS | Status: DC
  Administered 2021-01-10 – 2021-01-13 (×4): 6 via TOPICAL

## 2021-01-10 NOTE — Evaluation (Signed)
Occupational Therapy Evaluation Patient Details Name: Joshua [name] MRN: [id number] DOB: [birth year] Today's Date: 01/10/2021   History of Present Illness 60 y/o male presented to [facility] ED on 10/18 with speech difficulty and gait instability/bilateral LE weakness. Transferred to [facility]. 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 S/p thrombectomy on 10/18. 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 Flat affect, anxious and concrete thought processes. Unsure of baseline cognition. Apparent ataxia R U/LE, requiring +2 min A for safe mobility and ADL @ RW level. At this time recommend rehab at [facility] to reach modified independent goals to facilitate safe DC home.      Recommendations for follow up therapy are one component of a multi-disciplinary discharge planning process, led by the attending physician.  Recommendations may be updated based on patient status, additional functional criteria and insurance authorization.   Follow Up Recommendations  CIR    Equipment Recommendations  3 in 1 bedside commode;Other (comment) (RW)    Recommendations for Other Services Rehab consult     Precautions / Restrictions Precautions Precautions: Fall Precaution Comments: R side ataxia Restrictions Weight Bearing Restrictions: No      Mobility Bed Mobility Overal bed mobility: Needs Assistance Bed Mobility: Supine to Sit     Supine to sit: Supervision          Transfers Overall transfer level: Needs assistance   Transfers: Sit to/from Stand Sit to Stand: Min assist;+2 physical assistance         General transfer comment: requires support for stepping R ataxia LE    Balance Overall balance assessment: Needs assistance   Sitting balance-Leahy Scale: Fair       Standing balance-Leahy Scale: Poor                              ADL either performed or assessed with clinical judgement   ADL Overall ADL's : Needs assistance/impaired Eating/Feeding: Modified independent   Grooming: Set up;Supervision/safety;Cueing for safety Grooming Details (indicate cue type and reason): Min A for steadying in standing Upper Body Bathing: Set up;Supervision/ safety;Sitting   Lower Body Bathing: Minimal assistance;Sit to/from stand   Upper Body Dressing : Supervision/safety;Set up;Sitting   Lower Body Dressing: Minimal assistance   Toilet Transfer: Minimal assistance;+2 for safety/equipment;Ambulation;RW;Grab bars           Functional mobility during ADLs: Minimal assistance;Rolling walker;+2 for safety/equipment       Vision Baseline Vision/History: 1 Wears glasses (reading) Vision Assessment?: Yes Additional Comments: no apparent deficits; will further assess     Perception Perception Comments: appears intact; cues for spatial positioning in RW however most likley due to slow processing and problem solving; no over/undershooting noted   Praxis Praxis Praxis-Other Comments: appears intact    Pertinent Vitals/Pain Pain Assessment: No/denies pain     Hand Dominance Right   Extremity/Trunk Assessment Upper Extremity Assessment Upper Extremity Assessment: RUE deficits/detail RUE Deficits / Details: strength overall WFL however apparent ataxia; using functionally with minimal difficulty RUE Sensation: decreased light touch RUE Coordination: decreased fine motor;decreased gross motor   Lower Extremity Assessment Lower Extremity Assessment: Defer to PT evaluation   Cervical / Trunk Assessment Cervical / Trunk Assessment: Other exceptions (mild trunkal ataxia noted)   Communication Communication Communication: Expressive difficulties   Cognition Arousal/Alertness: Awake/alert Behavior During  Therapy: Anxious;Flat affect Overall Cognitive Status: No family/caregiver present to  determine baseline cognitive functioning                                 General Comments: concrete thinking; increased time for problem solving; waits for instructions to perform tasks; apologizing throughout session; [employer] executive funcitoning however unsure of baseline; perseverating on not having money to pay for "any of this"   General Comments  drives; works with the school-  in the kitchen    Exercises     Shoulder Instructions      Home Living Family/patient expects to be discharged to:: Private residence Living Arrangements: Alone Available Help at Discharge: Family;Friend(s);Available PRN/intermittently Type of Home: House Home Access: Stairs to enter [employer] of Steps: 1   Home Layout: One level     Bathroom Shower/Tub: Chief Strategy Officer: Standard Bathroom Accessibility: Yes How Accessible: Accessible via walker Home Equipment: None          Prior Functioning/Environment Level of Independence: Independent                 OT Problem List: Decreased strength;Decreased range of motion;Decreased activity tolerance;Impaired balance (sitting and/or standing);Decreased coordination;Decreased cognition;Decreased safety awareness;Impaired sensation;Impaired UE functional use;Obesity      OT Treatment/Interventions: Self-care/ADL training;Therapeutic exercise;Neuromuscular education;DME and/or AE instruction;Therapeutic activities;Cognitive remediation/compensation;Visual/perceptual remediation/compensation;Patient/family education;Balance training    OT Goals(Current goals can be found in the care plan section) Acute Rehab OT Goals Patient Stated Goal: to get better so he cn work OT Goal Formulation: With patient Time For Goal Achievement: 01/24/21 Potential to Achieve Goals: Good  OT Frequency: Min 2X/week   Barriers to D/C:            Co-evaluation PT/OT/SLP Co-Evaluation/Treatment: Yes Reason for  Co-Treatment: Necessary to address cognition/behavior during functional activity;For patient/therapist safety;To address functional/ADL transfers   OT goals addressed during session: ADL's and self-care      AM-PAC OT "6 Clicks" Daily Activity     Outcome Measure Help from another person eating meals?: None Help from another person taking care of personal grooming?: A Little Help from another person toileting, which includes using toliet, bedpan, or urinal?: A Little Help from another person bathing (including washing, rinsing, drying)?: A Little Help from another person to put on and taking off regular upper body clothing?: A Little Help from another person to put on and taking off regular lower body clothing?: A Little 6 Click Score: 19   End of Session Equipment Utilized During Treatment: Gait belt;Rolling walker Nurse Communication: Mobility status;Other (comment) (DC needs)  Activity Tolerance: Patient tolerated treatment well Patient left: in chair;with call bell/phone within reach;with chair alarm set  OT Visit Diagnosis: Unsteadiness on feet (R26.81);Other abnormalities of gait and mobility (R26.89);Muscle weakness (generalized) (M62.81);Ataxia, unspecified (R27.0);Other symptoms and signs involving cognitive function                Time: 1019-1053 OT Time Calculation (min): 34 min Charges:  OT General Charges $OT Visit: 1 Visit OT Evaluation $OT Eval Moderate Complexity: 1 Mod  Christyan Reger, OT/L   Acute OT Clinical Specialist Acute Rehabilitation Services Pager [phone] Office [phone]   [facility] 01/10/2021, 11:26 AM

## 2021-01-10 NOTE — Evaluation (Signed)
Physical Therapy Evaluation Patient Details Name: Joshua [name] MRN: [id number] DOB: 1960/05/03 Today's Date: 01/10/2021  History of Present Illness  60 y/o male presented to [facility] ED on 10/18 with speech difficulty and gait instability/bilateral LE weakness. Transferred to [facility]. 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 S/p thrombectomy on 10/18. 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 Patient presents with R side ataxia, weakness, impaired balance, impaired sensation, and impaired cognition. Patient with slow processing and concrete thinking. Patient requires minA+2 for ambulation with use of RW for stability. Patient will benefit from skilled PT services during acute stay to address listed deficits. Recommend CIR following discharge to maximize functional independence prior to returning home. Anticipate patient will be modI with help of CIR prior to returning home.         Recommendations for follow up therapy are one component of a multi-disciplinary discharge planning process, led by the attending physician.  Recommendations may be updated based on patient status, additional functional criteria and insurance authorization.  Follow Up Recommendations CIR    Equipment Recommendations  Rolling [name] with 5" wheels    Recommendations for Other Services Rehab consult     Precautions / Restrictions Precautions Precautions: Fall Precaution Comments: R side ataxia Restrictions Weight Bearing Restrictions: No      Mobility  Bed Mobility Overal bed mobility: Needs Assistance Bed Mobility: Supine to Sit     Supine to sit: Supervision          Transfers Overall transfer level: Needs assistance Equipment used: Rolling [name] (2 wheeled) Transfers: Sit to/from Stand Sit to Stand: Min assist;+2  physical assistance         General transfer comment: requires support for stepping R ataxia LE  Ambulation/Gait Ambulation/Gait assistance: Min assist;+2 physical assistance;+2 safety/equipment Gait Distance (Feet): 10 Feet (x10') Assistive device: Rolling [name] (2 wheeled) Gait Pattern/deviations: Step-through pattern;Decreased stride length;Ataxic Gait velocity: decreased   General Gait Details: initially stepping with HHAx2 but required modA+2, provided RW with improved stability. MinA+2 for ambulation in/out of bathroom with standing rest break for grooming task. Ataxia noted with R LE  Stairs            Wheelchair Mobility    Modified Rankin (Stroke Patients Only) Modified Rankin (Stroke Patients Only) Pre-Morbid Rankin Score: No symptoms Modified Rankin: Moderately severe disability     Balance Overall balance assessment: Needs assistance Sitting-balance support: No upper extremity supported Sitting balance-Leahy Scale: Fair     Standing balance support: Bilateral upper extremity supported Standing balance-Leahy Scale: Poor                               Pertinent Vitals/Pain Pain Assessment: No/denies pain Faces Pain Scale: No hurt    Home Living Family/patient expects to be discharged to:: Private residence Living Arrangements: Alone Available Help at Discharge: Family;Friend(s);Available PRN/intermittently Type of Home: House Home Access: Stairs to enter   [employer] of Steps: 1 Home Layout: One level Home Equipment: None      Prior Function Level of Independence: Independent               Hand Dominance   Dominant Hand: Right    Extremity/Trunk Assessment   Upper Extremity Assessment Upper Extremity Assessment: Defer to OT evaluation    Lower Extremity  Assessment Lower Extremity Assessment: RLE deficits/detail RLE Deficits / Details: strength 5/5; ataxia noted with heel to shin on R RLE Sensation:  decreased light touch;decreased proprioception RLE Coordination: decreased fine motor;decreased gross motor    Cervical / Trunk Assessment Cervical / Trunk Assessment: Other exceptions (mild trunkal ataxia noted)  Communication   Communication: Expressive difficulties  Cognition Arousal/Alertness: Awake/alert Behavior During Therapy: Anxious;Flat affect Overall Cognitive Status: No family/caregiver present to determine baseline cognitive functioning                                 General Comments: concrete thinking; increased time for problem solving; waits for instructions to perform tasks; apologizing throughout session; Poor executive functioning however unsure of baseline; perseverating on not having money to pay for "any of this"      General Comments      Exercises     Assessment/Plan    PT Assessment Patient needs continued PT services  PT Problem List Decreased strength;Decreased activity tolerance;Decreased balance;Decreased mobility;Decreased coordination;Decreased cognition;Decreased knowledge of use of DME;Decreased safety awareness;Impaired sensation       PT Treatment Interventions DME instruction;Gait training;Stair training;Functional mobility training;Therapeutic activities;Therapeutic exercise;Balance training;Neuromuscular re-education;Patient/family education    PT Goals (Current goals can be found in the Care Plan section)  Acute Rehab PT Goals Patient Stated Goal: to get better so he can work PT Goal Formulation: With patient Time For Goal Achievement: 01/24/21 Potential to Achieve Goals: Good    Frequency Min 4X/week   Barriers to discharge        Co-evaluation PT/OT/SLP Co-Evaluation/Treatment: Yes Reason for Co-Treatment: Necessary to address cognition/behavior during functional activity;For patient/therapist safety;To address functional/ADL transfers PT goals addressed during session: Mobility/safety with mobility;Balance;Proper  use of DME         AM-PAC PT "6 Clicks" Mobility  Outcome Measure Help needed turning from your back to your side while in a flat bed without using bedrails?: A Little Help needed moving from lying on your back to sitting on the side of a flat bed without using bedrails?: A Little Help needed moving to and from a bed to a chair (including a wheelchair)?: Total Help needed standing up from a chair using your arms (e.g., wheelchair or bedside chair)?: Total Help needed to walk in hospital room?: Total Help needed climbing 3-5 steps with a railing? : Total 6 Click Score: 10    End of Session Equipment Utilized During Treatment: Gait belt Activity Tolerance: Patient tolerated treatment well Patient left: in chair;with call bell/phone within reach;with chair alarm set Nurse Communication: Mobility status PT Visit Diagnosis: Unsteadiness on feet (R26.81);Muscle weakness (generalized) (M62.81);Ataxic gait (R26.0)    Time: 1610-9604 PT Time Calculation (min) (ACUTE ONLY): 39 min   Charges:   PT Evaluation $PT Eval Moderate Complexity: 1 Mod PT Treatments $Gait Training: 8-22 mins        Joshua [name] A. Dan Humphreys PT, DPT Acute Rehabilitation Services Pager [phone] Office [phone]   Joshua [name] 01/10/2021, 3:26 PM

## 2021-01-10 NOTE — Progress Notes (Signed)
STROKE TEAM PROGRESS NOTE   SUBJECTIVE (INTERVAL HISTORY) His OT is at the bedside. He is sitting in chair, awake alert, neuro intact except mild right nasolabial fold flattening and right UE and LE dysmetria. BP stable on meds.   OBJECTIVE Temp:  [98 F (36.7 C)-99.2 F (37.3 C)] 98.7 F (37.1 C) (10/19 0800) Pulse Rate:  [52-86] 77 (10/19 0900) Resp:  [9-25] 20 (10/19 0900) BP: (101-153)/(71-108) 153/100 (10/19 0900) SpO2:  [90 %-100 %] 92 % (10/19 0900) Arterial Line BP: (157-170)/(70-75) 163/70 (10/18 1300)  Recent Labs  Lab 01/09/21 0217  GLUCAP 141*   Recent Labs  Lab 01/09/21 0240 01/10/21 0449  NA 137 136  K 4.2 3.8  CL 101 102  CO2 25 21*  GLUCOSE 154* 104*  BUN 22* 13  CREATININE 1.02 1.08  CALCIUM 9.4 8.8*   Recent Labs  Lab 01/09/21 0240  AST 20  ALT 20  ALKPHOS 54  BILITOT 1.3*  PROT 8.3*  ALBUMIN 4.9   Recent Labs  Lab 01/09/21 0240 01/10/21 0449  WBC 11.2* 8.7  NEUTROABS 9.8* 6.5  HGB 18.1* 16.2  HCT 53.0* 47.4  MCV 89.7 89.1  PLT 202 196   No results for input(s): CKTOTAL, CKMB, CKMBINDEX, TROPONINI in the last 168 hours. Recent Labs    01/09/21 0240  LABPROT 13.7  INR 1.1   Recent Labs    01/09/21 0515  COLORURINE YELLOW*  LABSPEC 1.044*  PHURINE 5.0  GLUCOSEU NEGATIVE  HGBUR NEGATIVE  BILIRUBINUR NEGATIVE  KETONESUR 5*  PROTEINUR 30*  NITRITE NEGATIVE  LEUKOCYTESUR NEGATIVE       Component Value Date/Time   CHOL 194 01/10/2021 0449   TRIG 205 (H) 01/10/2021 0449   HDL 30 (L) 01/10/2021 0449   CHOLHDL 6.5 01/10/2021 0449   VLDL 41 (H) 01/10/2021 0449   LDLCALC 123 (H) 01/10/2021 0449   Lab Results  Component Value Date   HGBA1C 5.3 01/10/2021      Component Value Date/Time   LABOPIA NONE DETECTED 01/09/2021 0515   COCAINSCRNUR NONE DETECTED 01/09/2021 0515   LABBENZ NONE DETECTED 01/09/2021 0515   AMPHETMU NONE DETECTED 01/09/2021 0515   THCU NONE DETECTED 01/09/2021 0515   LABBARB NONE DETECTED  01/09/2021 0515    Recent Labs  Lab 01/09/21 0240  ETH <10    I have personally reviewed the radiological images below and agree with the radiology interpretations.  CT HEAD WO CONTRAST  Result Date: 01/09/2021 CLINICAL DATA:  Stroke follow-up. Status post basilar artery thrombectomy. EXAM: CT HEAD WITHOUT CONTRAST TECHNIQUE: Contiguous axial images were obtained from the base of the skull through the vertex without intravenous contrast. COMPARISON:  Head CT earlier today FINDINGS: Brain: There is new mild asymmetric hypodensity in the right cerebellar hemisphere suspicious for an acute infarct. No acute supratentorial infarct, intracranial hemorrhage, midline shift, or extra-axial fluid collection is identified. Hypodensities in the cerebral white matter bilaterally are unchanged and nonspecific but compatible with mild chronic small vessel ischemic disease. The ventricles and sulci are normal. Vascular: Calcified atherosclerosis at the skull base. New stent in the distal right vertebral artery. Residual intravascular contrast material. Skull: No fracture or suspicious osseous lesion. Sinuses/Orbits: Mild mucosal thickening in the paranasal sinuses. Clear mastoid air cells. Unremarkable orbits. Other: None. IMPRESSION: 1. Suspected acute right cerebellar infarct. 2. No intracranial hemorrhage. Electronically Signed   By: Sebastian Ache M.D.   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 Status post endovascular revascularization of basilar artery and right vertebrobasilar junction. Stented right vertebrobasilar junction. Right cerebellar infarcts suspected on postprocedure CT. EXAM: MRA HEAD WITHOUT CONTRAST TECHNIQUE: Angiographic images of the Circle of Willis were acquired using MRA technique without intravenous  contrast. COMPARISON:  MRI this morning reported separately. CTA head and neck yesterday. FINDINGS: Susceptibility artifact in the visible distal right vertebral artery, with preserved flow signal in the distal aspect of the stent. Preserved flow signal at the bilateral vertebrobasilar junction, however, that on the left might be retrograde. Patent basilar artery with mild irregularity, no stenosis. Bilateral AICA, SCA, and PCA origins are patent. Posterior communicating arteries are diminutive or absent. Bilateral PCA branches are patent, mildly irregular in the left P2 segment. Antegrade flow in both ICA siphons. Mild siphon irregularity, no significant siphon stenosis. Patent carotid termini, MCA and ACA origins. Mildly dominant A1 as seen yesterday. Anterior communicating artery, visible ACA branches, right MCA M1 and visible right MCA branches are within normal limits. There is a mild left MCA mid M1 segment irregularity and stenosis which is more apparent than on the CTA yesterday (series 1007, image 10). But the left MCA bifurcation is patent without stenosis. Visible left MCA branches are within normal limits. IMPRESSION: 1. Patent Basilar Artery without stenosis. Patent bilateral AICA, SCA, and PCA origins. 2. Patent vertebrobasilar junction, but the distal vertebral arteries are minimally included on this exam and there is stent artifact on the right. 3. Anterior circulation remarkable for mild bilateral ICA siphon and Left MCA M1 atherosclerosis. Electronically Signed   By: Odessa Fleming M.D.   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 Status post endovascular revascularization of basilar artery and right vertebrobasilar junction. Stented right vertebrobasilar junction. Right cerebellar infarcts suspected on postprocedure CT.  EXAM: MRI HEAD WITHOUT CONTRAST TECHNIQUE: Multiplanar, multiecho pulse sequences of the brain and surrounding structures were obtained without intravenous contrast. COMPARISON:  Head CT 14 10 and 0226 hours yesterday. FINDINGS: Brain: Confluent right superior cerebellar artery territory infarct with restricted diffusion (series 7, image 59). But numerous scattered small additional bilateral cerebellar hemisphere infarcts. Restricted diffusion at the right lateral medulla also on series 5, image 66. And there are several small foci of diffusion restriction also in the right midbrain near the midline (series 5, image 77). Several punctate infarcts in the right thalamus including ventrally on series 5, image 84. Similar scattered small infarcts in the bilateral occipital lobes and occasionally the right parietal lobe. And there are perhaps 1 or 2 punctate infarcts also in the anterior circulation (left middle frontal gyrus series 5, image 101). No evidence of hemorrhagic transformation. Cytotoxic edema at the affected areas, most pronounced in the right superior cerebellum. Mild mass effect on the 4th ventricle, but no ventriculomegaly or other significant intracranial mass effect. Superimposed patchy and scattered chronic cerebral white matter T2 and FLAIR hyperintensity, most pronounced in the periatrial regions. Deep gray matter nuclei are within normal limits. There is a chronic microhemorrhage in the anterior right frontal lobe white matter series 14, image 32. No evidence of mass lesion, extra-axial collection or acute intracranial hemorrhage. Cervicomedullary junction and pituitary are within normal limits. Vascular: Evidence of continued absent flow in the distal left vertebral artery.  But other major intracranial vascular flow voids are preserved, see also MRA today reported separately. Skull and upper cervical spine: Negative visible cervical spine. Visualized bone marrow signal is within normal limits.  Sinuses/Orbits: Negative. Other: Trace mastoid air cell fluid on the right. Visible internal auditory structures appear normal. Negative visible scalp and face. IMPRESSION: 1. Confluent right cerebellar SCA territory infarct with numerous other small infarcts scattered throughout the bilateral posterior circulation, including: Right lateral medulla, bilateral cerebellar hemispheres, right midbrain, right thalamus, bilateral occipital lobes. Occasional punctate anterior circulation infarcts. 2. No associated hemorrhage. Mild mass effect on the 4th ventricle but no ventriculomegaly. No midline shift and normal basilar cisterns. 3. Moderate underlying chronic cerebral white matter disease. Solitary chronic microhemorrhage in the right frontal lobe white matter. 4. MRA today reported separately. Electronically Signed   By: H  Hall M.D.   On: 01/10/2021 05:06   DG Chest Port 1 View  Result Date: 01/09/2021 CLINICAL DATA:  Increased weakness and slurred speech EXAM: PORTABLE CHEST 1 VIEW COMPARISON:  07/21/2016 FINDINGS: Cardiac shadow is enlarged. Postsurgical changes are again noted and stable. Lungs are clear bilaterally. No bony abnormality is seen. IMPRESSION: Stable cardiomegaly.  No acute abnormality noted. Electronically Signed   By: Mark  Lukens M.D.   On: 01/09/2021 02:57   CT HEAD CODE STROKE WO CONTRAST  Result Date: 01/09/2021 CLINICAL DATA:  Code stroke.  Neuro deficit, weakness EXAM: CT HEAD WITHOUT CONTRAST TECHNIQUE: Contiguous axial images were obtained from the base of the skull through the vertex without intravenous contrast. COMPARISON:  04/07/2013 FINDINGS: Brain: No evidence of acute infarction, hemorrhage, cerebral edema, mass, mass effect, or midline shift. Ventricles and sulci are normal for age. No extra-axial fluid collection. Periventricular white matter changes, likely the sequela of chronic small vessel ischemic disease. Vascular: No hyperdense vessel or unexpected calcification.  Skull: Normal. Negative for fracture or focal lesion. Sinuses/Orbits: No acute finding. Mild mucosal thickening in the sinuses. Other: The mastoid air cells are well aerated. ASPECTS ([place] Stroke Program Early CT Score) - Ganglionic level infarction (caudate, lentiform nuclei, internal capsule, insula, M1-M3 cortex): 7 - Supraganglionic infarction (M4-M6 cortex): 3 Total score (0-10 with 10 being normal): 10 IMPRESSION: 1. No acute intracranial process. 2. ASPECTS is 10 Code stroke imaging results were communicated on 01/09/2021 at 2:38 am to provider Dr. Sung Via telephone, who verbally acknowledged these results. Electronically Signed   By: Alison  Vasan M.D.   On: 01/09/2021 02:40   CT ANGIO HEAD NECK W WO CM (CODE STROKE)  Addendum Date: 01/09/2021   ADDENDUM REPORT: 01/09/2021 05:01 ADDENDUM: Critical Value/emergent results were called by telephone at the time of interpretation on 01/09/2021 at 0437 hours to Dr. JADE SUNG , who verbally acknowledged these results. Electronically Signed   By: H  Hall M.D.   On: 01/09/2021 05:01   Result Date: 01/09/2021 CLINICAL DATA:  60 year old male code stroke presentation. Weakness. EXAM: CT ANGIOGRAPHY HEAD AND NECK TECHNIQUE: Multidetector CT imaging of the head and neck was performed using the standard protocol during bolus administration of intravenous contrast. Multiplanar CT image reconstructions and MIPs were obtained to evaluate the vascular anatomy. Carotid stenosis measurements (when applicable) are obtained utilizing NASCET criteria, using the distal internal carotid diameter as the denominator. CONTRAST:  [id number] IOHEXOL 350 MG/ML SOLN COMPARISON:  Noncontrast head CT 0226 hours. FINDINGS: CTA NECK Skeleton: Prior sternotomy. Carious left posterior mandible dentition. Cervical spine degeneration. No acute osseous abnormality identified. Upper chest: Mediastinal lipomatosis. Prior CABG. Partially visible  cardiomegaly. No evidence of pericardial  effusion or mediastinal lymphadenopathy. Visible major airways appear patent, minor atelectasis. Other neck: Negative. Aortic arch: 3 vessel arch configuration. Minor arch atherosclerosis. Right carotid system: Mildly tortuous brachiocephalic artery and proximal right CCA without stenosis. Mild soft plaque proximal to the right carotid bifurcation without stenosis. Partially retropharyngeal course of the bifurcation and right ICA. Mild partially calcified atherosclerosis without stenosis. Left carotid system: Negative left CCA origin. Mildly tortuous left CCA with no significant plaque or stenosis. Partially retropharyngeal left carotid bifurcation and ICA. Mild mostly soft plaque of the proximal ICA without stenosis. Mild tortuosity of the vessel in calcified plaque just below the skull base. Vertebral arteries: Mildly tortuous proximal right subclavian artery without stenosis. Normal right vertebral artery origin. Gradient of decreasing contrast opacification throughout the cervical right vertebral artery which is occluded in the V3 segment leading up to the skull base (series 7, image 161). Mild plaque in the proximal left subclavian artery without stenosis. Mild plaque at the left vertebral artery origin without stenosis. Asymmetrically better enhancement of the cervical left vertebral artery, however, the vessel is tapered and abruptly occluded at the distal V3 segment at the skull base on series 7, image 162. CTA HEAD Posterior circulation: Bilateral proximal vertebral artery V4 segments are occluded, but reconstituted at the PICA origins. Distal right V4 enhances somewhat better. Patent vertebrobasilar junction without stenosis. Patent proximal basilar artery. However, there is a 4-5 mm filling defect in the distal basilar extending to the level of the SCA origins compatible with acute thrombosis. See series 7, image 116 and series 8, image 123. Despite this the basilar tip and PCA origins remain patent. The  right SCA is occluded. The left SCA remains patent. Posterior communicating arteries are diminutive or absent. Bilateral PCA branches are patent but irregular, with mild to moderate irregularity and stenosis which appears maximal at the right P3 bifurcation such as series 10, image 56. Anterior circulation: Both ICA siphons are patent. Bilateral siphon atherosclerosis is mild without stenosis. Normal ophthalmic artery origins. Patent carotid termini. Patent MCA and ACA origins. Right A1 appears somewhat dominant. Normal anterior communicating artery. Bilateral ACA branches are within normal limits. Left MCA M1 segment and trifurcation are patent without stenosis. Right MCA M1 segment and bifurcation are patent without stenosis. Bilateral MCA branches are mildly irregular. No MCA occlusion is identified. Venous sinuses: Early contrast timing, grossly patent. Anatomic variants: Mildly dominant right A1. Review of the MIP images confirms the above findings IMPRESSION: 1. 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 Both vertebral arteries occlude at the skull base. And there is a a 4-5 mm segment of thrombus at the Distal Basilar at the level of the SCAs. The Right SCA origin is occluded. But the Basilar tip and PCAs remain patent. There is mild to moderate irregularity and stenosis of the distal PCAs greater on the right. 2. Bilateral carotid artery atherosclerosis without hemodynamically significant stenosis. Mild intracranial atherosclerosis. 3. Prior CABG.  Mild aortic arch atherosclerosis. Electronically Signed: By: Odessa Fleming M.D. On: 01/09/2021 04:33      PHYSICAL EXAM  Temp:  [98 F (36.7 C)-99.2 F (37.3 C)] 98.7 F (37.1 C) (10/19 0800) Pulse Rate:  [52-86] 77 (10/19 0900) Resp:  [9-25] 20 (10/19 0900) BP: (101-153)/(71-108) 153/100 (10/19 0900) SpO2:  [90 %-100 %] 92 % (10/19 0900) Arterial Line BP: (157-170)/(70-75) 163/70 (10/18  1300)  General - Well nourished, well developed, in no apparent  distress.  Ophthalmologic - fundi not visualized due to noncooperation.  Cardiovascular - Regular rhythm and rate.  Neuro - awake, alert, eyes open, orientated to age, place, and time. No aphasia, fluent language, able to name and repeat, following all simple commands. Slight dysarthria. No gaze palsy, however, with lateral gaze showed unsustained b/l bidirectional nystagmus, no disconjugate eyes. tracking bilaterally, visual field full, PERRL. Slight right nasolabial fold flattening. Tongue midline. Bilateral UEs 5/5, no drift. Bilaterally LEs equal strength. Sensation symmetric today, R HTN and FTN slight dysmetria, gait not tested.     ASSESSMENT/PLAN Mr. Demontray Franta is a 60 y.o. male with history of CHF, CAD/NSTEMI s/p CABG, HTN, obesity admitted for speech difficulty, b/l LE weakness and gait difficulty. No tPA given due to outside window.    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 Cardioembolic source is also in DDx CT no acute finding CTA head and neck - b/l V3 and V4 occlusion, BA thrombus at SCA level with right SCA occlusion S/p IR occluded bilateral VBJs  and BA with TICI2c and right VBJ stenting CT repeat suspect small right cerebellar infarct MRI Confluent right cerebellar SCA territory infarct with numerous other small infarcts scattered throughout the bilateral posterior circulation, including: Right lateral medulla, bilateral cerebellar hemispheres, right midbrain, right thalamus, bilateral occipital lobes. MRA posterior circulation patent now 2D Echo pending LDL 123 HgbA1c 5.3 SCDs for VTE prophylaxis aspirin 81 mg daily prior to admission, now on aspirin 81 mg daily and Brilinta (ticagrelor) 90 mg bid. Ongoing aggressive stroke risk factor management Therapy recommendations:  pending  Disposition:  pending  CHF  Home  meds - coreg, entresto, lasix, spironolactone TTE pending On coreg, lasix and spironolactone  Gentle hydration  Hypertension stable Off cleviprex On coreg, lasix and spironolactone  Long term BP goal normotensive  Hyperlipidemia Home meds:  lipitor 40  LDL 123, goal < 70 On lipitor 80 Continue statin at discharge  Other Stroke Risk Factors Obesity, There is no height or weight on file to calculate BMI.  Coronary artery disease s/p CABG  Other Active Problems Leukocytosis WBC 11.2->8.7 Polycythemia Hb 18.1->16.2  Hospital day # 1  This patient is critically ill due to [place] and BA occlusion s/p IR, posterior circulation infarcts, CHF and at significant risk of neurological worsening, death form recurrent stroke, stent occlusion, heart failure, hemorrhagic conversion. This patient's care requires constant monitoring of vital signs, hemodynamics, respiratory and cardiac monitoring, review of multiple databases, neurological assessment, discussion with family, other specialists and medical decision making of high complexity. I spent 35 minutes of neurocritical care time in the care of this patient.   Marvel Plan, MD PhD Stroke Neurology 01/10/2021 11:14 AM    To contact Stroke Continuity provider, please refer to [email or web address]. After hours, contact General Neurology

## 2021-01-10 NOTE — Evaluation (Signed)
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   LIMA to LAD, SVG to OM2, SVG to OM3, SVG to PDA, EVH via right thigh and bilateral lower legs   Past Surgical History:  Past Surgical History:  Procedure Laterality Date   CORONARY ARTERY BYPASS GRAFT N/A 04/09/2013   Procedure: CORONARY ARTERY BYPASS GRAFTING (CABG);  Surgeon: Purcell Nails, MD;  Location: [facility] OR;  Service: Open Heart Surgery;  Laterality: N/A;  CABG x four,  using left internal mammary artery and right and left leg greater saphenous vein harvested endoscopically   INTRAOPERATIVE TRANSESOPHAGEAL ECHOCARDIOGRAM N/A 04/09/2013   Procedure: INTRAOPERATIVE TRANSESOPHAGEAL ECHOCARDIOGRAM;   Surgeon: Purcell Nails, MD;  Location: [facility] OR;  Service: Open Heart Surgery;  Laterality: N/A;   RADIOLOGY WITH ANESTHESIA N/A 01/09/2021   Procedure: IR WITH ANESTHESIA;  Surgeon: Julieanne Cotton, MD;  Location: [facility] OR;  Service: Radiology;  Laterality: N/A;   RIGHT/LEFT HEART CATH AND CORONARY/GRAFT ANGIOGRAPHY N/A 07/26/2016   Procedure: Right/Left Heart Cath and Coronary/Graft Angiography;  Surgeon: Dolores Patty, MD;  Location: [facility] INVASIVE CV LAB;  Service: Cardiovascular;  Laterality: N/A;   HPI:  60 y/o male presented to [facility] ED on 10/18 with speech difficulty and gait instability/bilateral LE weakness. Transferred to [facility]. 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 MRI (10/19) showed confluent R cerebellar SCA territory infarct with numerous other small infarcts scattered throughout the bilateral posterior circulation, including: Right lateral medulla, bilateral cerebellar hemispheres, right midbrain, right thalamus, bilateral occipital lobes. S/p thrombectomy on 10/18. PMH: CAD, HTN, CHF, NSTEMI   Assessment / Plan / Recommendation Clinical Impression  Pt presents with cognitive communication deficits and very mild dysarthria post CVA. [facility] Mental Status Examination (SLUMS) administered with pt scoring 22/30, suggesting Mild Cognitive Impairment. He demonstrates strengths in orientation, delayed recall (after repeated repetition of word list) and mathematical calculations. Higher level executive functions and immediate recall/initial storage of information was very poor and appeared to be impacted by reduced sustained attention. Pt very labile throughout session regarding his present illness, resulting in tangential speech. Speech overall in conversation was 90-100% intelligible, though at times with articulatory imprecision and fast rate. SLP services to f/u for treatment of cognitive functions and  briefly for motor speech.    SLP Assessment  SLP  Recommendation/Assessment: Patient needs continued Speech Lanaguage Pathology Services SLP Visit Diagnosis: Dysarthria and anarthria (R47.1);Cognitive communication deficit (R41.841)    Recommendations for follow up therapy are one component of a multi-disciplinary discharge planning process, led by the attending physician.  Recommendations may be updated based on patient status, additional functional criteria and insurance authorization.    Follow Up Recommendations  Inpatient Rehab    Frequency and Duration min 2x/week  2 weeks      SLP Evaluation Cognition  Overall Cognitive Status: Impaired/Different from baseline Arousal/Alertness: Awake/alert Orientation Level: Oriented X4 Attention: Sustained Sustained Attention: Impaired Sustained Attention Impairment: Verbal basic;Verbal complex;Functional basic Memory: Impaired Memory Impairment: Storage deficit;Retrieval deficit;Decreased recall of new information Immediate Memory Recall:  (2/5 initially, 5/5 after several attempts (SLUMS)) Awareness: Impaired Awareness Impairment: Intellectual impairment;Emergent impairment Problem Solving: Impaired Problem Solving Impairment: Functional basic Executive Function: Self Monitoring;Self Correcting;Organizing;Sequencing Sequencing: Impaired Sequencing Impairment: Functional basic Organizing: Impaired Organizing Impairment: Functional basic Self Monitoring: Impaired Self Monitoring Impairment: Functional basic;Verbal basic Self Correcting: Impaired Self Correcting Impairment: Functional basic;Verbal basic Behaviors: Lability       Comprehension  Auditory Comprehension Overall Auditory Comprehension: Appears within functional limits for tasks assessed Visual Recognition/Discrimination Discrimination: Not tested Reading Comprehension Reading Status: Not tested    Expression Expression Primary Mode of Expression: Verbal Verbal Expression Overall Verbal Expression: Appears within  functional limits for tasks assessed Written Expression Written Expression: Not tested   Oral / Motor  Oral Motor/Sensory Function Overall Oral Motor/Sensory Function: Within functional limits Motor Speech Overall Motor Speech: Impaired Phonation: Normal Resonance: Within functional limits Articulation: Impaired Level of Impairment: Conversation Intelligibility: Intelligibility reduced Word: 75-100% accurate Phrase: 75-100% accurate Sentence: 75-100% accurate Conversation: 75-100% accurate Motor Planning: Witnin functional limits Motor Speech Errors: Unaware Effective Techniques: Slow rate;Over-articulate;Increased vocal intensity   GO                   Avie Echevaria, MA, CCC-SLP Acute Rehabilitation Services Office Number: [phone]  Paulette Blanch 01/10/2021, 2:28 PM

## 2021-01-10 NOTE — Progress Notes (Signed)
Chief Complaint: Patient was seen today for posterior CVA post intervention  Supervising Physician: Julieanne Cotton  [facility]  Subjective: S/P complete revascularization of occluded basilar artery and RT VBJ with x 1 pass with 10mm x 40 mm solitaire X retriever and aspiration followed by stent assisted angioplasty of underlying high grade stenosis of RT VBJ   Pt awake and alert. Denies headache or blurred vision. Had MRI this am.  Objective: Physical Exam: BP (!) 145/93   Pulse 75   Temp 98.7 F (37.1 C) (Oral)   Resp (!) 21   SpO2 91%  A&O x 3 Speech mildly dysarthric Face symmetric, tongue midline PERRLA, EOMI No drift. Fine motor intact, mildly uncoordinated on right Ext: (R)groin soft, NT Feet warm, excellent doppler pulses    Current Facility-Administered Medications:    acetaminophen (TYLENOL) tablet 650 mg, 650 mg, Oral, Q4H PRN **OR** acetaminophen (TYLENOL) 160 MG/5ML solution 650 mg, 650 mg, Per Tube, Q4H PRN **OR** acetaminophen (TYLENOL) suppository 650 mg, 650 mg, Rectal, Q4H PRN, Deveshwar, Sanjeev, MD   aspirin chewable tablet 81 mg, 81 mg, Oral, Daily **OR** aspirin chewable tablet 81 mg, 81 mg, Per Tube, Daily, Deveshwar, Sanjeev, MD   atorvastatin (LIPITOR) tablet 40 mg, 40 mg, Oral, Daily, Marvel Plan, MD, 40 mg at 01/09/21 2009   carvedilol (COREG) tablet 6.25 mg, 6.25 mg, Oral, BID WC, Marvel Plan, MD, 6.25 mg at 01/10/21 0831   Chlorhexidine Gluconate Cloth 2 % PADS 6 each, 6 each, Topical, Daily, Marvel Plan, MD   clevidipine (CLEVIPREX) infusion 0.5 mg/mL, 0-21 mg/hr, Intravenous, Continuous, Joaquim Nam, RPH, Last Rate: 6 mL/hr at 01/10/21 0600, 3 mg/hr at 01/10/21 0600   enoxaparin (LOVENOX) injection 40 mg, 40 mg, Subcutaneous, Q24H, Marvel Plan, MD, 40 mg at 01/09/21 2147   furosemide (LASIX) tablet 40 mg, 40 mg, Oral, Daily, Marvel Plan, MD, 40 mg at 01/09/21 2010   spironolactone (ALDACTONE) tablet 25 mg, 25 mg,  Oral, Daily, Marvel Plan, MD, 25 mg at 01/09/21 2010   ticagrelor (BRILINTA) tablet 90 mg, 90 mg, Oral, BID **OR** ticagrelor (BRILINTA) tablet 90 mg, 90 mg, Per Tube, BID, Julieanne Cotton, MD  Labs: CBC Recent Labs    01/09/21 0240 01/10/21 0449  WBC 11.2* 8.7  HGB 18.1* 16.2  HCT 53.0* 47.4  PLT 202 196   BMET Recent Labs    01/09/21 0240 01/10/21 0449  NA 137 136  K 4.2 3.8  CL 101 102  CO2 25 21*  GLUCOSE 154* 104*  BUN 22* 13  CREATININE 1.02 1.08  CALCIUM 9.4 8.8*   LFT Recent Labs    01/09/21 0240  PROT 8.3*  ALBUMIN 4.9  AST 20  ALT 20  ALKPHOS 54  BILITOT 1.3*   PT/INR Recent Labs    01/09/21 0240  LABPROT 13.7  INR 1.1     Studies/Results: CT HEAD WO CONTRAST  Result Date: 01/09/2021 CLINICAL DATA:  Stroke follow-up. Status post basilar artery thrombectomy. EXAM: CT HEAD WITHOUT CONTRAST TECHNIQUE: Contiguous axial images were obtained from the base of the skull through the vertex without intravenous contrast. COMPARISON:  Head CT earlier today FINDINGS: Brain: There is new mild asymmetric hypodensity in the right cerebellar hemisphere suspicious for an acute infarct. No acute supratentorial infarct, intracranial hemorrhage, midline shift, or extra-axial fluid collection is identified. Hypodensities in the cerebral white matter bilaterally are unchanged and nonspecific but compatible with mild chronic small vessel ischemic disease. The ventricles and sulci are  normal. Vascular: Calcified atherosclerosis at the skull base. New stent in the distal right vertebral artery. Residual intravascular contrast material. Skull: No fracture or suspicious osseous lesion. Sinuses/Orbits: Mild mucosal thickening in the paranasal sinuses. Clear mastoid air cells. Unremarkable orbits. Other: None. IMPRESSION: 1. Suspected acute right cerebellar infarct. 2. No intracranial hemorrhage. Electronically Signed   By: Sebastian Ache M.D.   On: 01/09/2021 15:57   MR ANGIO HEAD  WO CONTRAST  Result Date: 01/10/2021 CLINICAL DATA:  60 year old male code stroke presentation with bilateral distal vertebral artery and distal basilar artery thrombosis on CTA yesterday. Status post endovascular revascularization of basilar artery and right vertebrobasilar junction. Stented right vertebrobasilar junction. Right cerebellar infarcts suspected on postprocedure CT. EXAM: MRA HEAD WITHOUT CONTRAST TECHNIQUE: Angiographic images of the Circle of Willis were acquired using MRA technique without intravenous contrast. COMPARISON:  MRI this morning reported separately. CTA head and neck yesterday. FINDINGS: Susceptibility artifact in the visible distal right vertebral artery, with preserved flow signal in the distal aspect of the stent. Preserved flow signal at the bilateral vertebrobasilar junction, however, that on the left might be retrograde. Patent basilar artery with mild irregularity, no stenosis. Bilateral AICA, SCA, and PCA origins are patent. Posterior communicating arteries are diminutive or absent. Bilateral PCA branches are patent, mildly irregular in the left P2 segment. Antegrade flow in both ICA siphons. Mild siphon irregularity, no significant siphon stenosis. Patent carotid termini, MCA and ACA origins. Mildly dominant A1 as seen yesterday. Anterior communicating artery, visible ACA branches, right MCA M1 and visible right MCA branches are within normal limits. There is a mild left MCA mid M1 segment irregularity and stenosis which is more apparent than on the CTA yesterday (series 1007, image 10). But the left MCA bifurcation is patent without stenosis. Visible left MCA branches are within normal limits. IMPRESSION: 1. Patent Basilar Artery without stenosis. Patent bilateral AICA, SCA, and PCA origins. 2. Patent vertebrobasilar junction, but the distal vertebral arteries are minimally included on this exam and there is stent artifact on the right. 3. Anterior circulation remarkable for  mild bilateral ICA siphon and Left MCA M1 atherosclerosis. Electronically Signed   By: Odessa Fleming M.D.   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 Status post endovascular revascularization of basilar artery and right vertebrobasilar junction. Stented right vertebrobasilar junction. Right cerebellar infarcts suspected on postprocedure CT. EXAM: MRI HEAD WITHOUT CONTRAST TECHNIQUE: Multiplanar, multiecho pulse sequences of the brain and surrounding structures were obtained without intravenous contrast. COMPARISON:  Head CT 14 10 and 0226 hours yesterday. FINDINGS: Brain: Confluent right superior cerebellar artery territory infarct with restricted diffusion (series 7, image 59). But numerous scattered small additional bilateral cerebellar hemisphere infarcts. Restricted diffusion at the right lateral medulla also on series 5, image 66. And there are several small foci of diffusion restriction also in the right midbrain near the midline (series 5, image 77). Several punctate infarcts in the right thalamus including ventrally on series 5, image 84. Similar scattered small infarcts in the bilateral occipital lobes and occasionally the right parietal lobe. And there are perhaps 1 or 2 punctate infarcts also in the anterior circulation (left middle frontal gyrus series 5, image 101). No evidence of hemorrhagic transformation. Cytotoxic edema at the affected areas, most pronounced in the right superior cerebellum. Mild mass effect on the 4th ventricle, but  no ventriculomegaly or other significant intracranial mass effect. Superimposed patchy and scattered chronic cerebral white matter T2 and FLAIR hyperintensity, most pronounced in the periatrial regions. Deep gray matter nuclei are within normal limits. There is a chronic microhemorrhage in the  anterior right frontal lobe white matter series 14, image 32. No evidence of mass lesion, extra-axial collection or acute intracranial hemorrhage. Cervicomedullary junction and pituitary are within normal limits. Vascular: Evidence of continued absent flow in the distal left vertebral artery. But other major intracranial vascular flow voids are preserved, see also MRA today reported separately. Skull and upper cervical spine: Negative visible cervical spine. Visualized bone marrow signal is within normal limits. Sinuses/Orbits: Negative. Other: Trace mastoid air cell fluid on the right. Visible internal auditory structures appear normal. Negative visible scalp and face. IMPRESSION: 1. Confluent right cerebellar SCA territory infarct with numerous other small infarcts scattered throughout the bilateral posterior circulation, including: Right lateral medulla, bilateral cerebellar hemispheres, right midbrain, right thalamus, bilateral occipital lobes. Occasional punctate anterior circulation infarcts. 2. No associated hemorrhage. Mild mass effect on the 4th ventricle but no ventriculomegaly. No midline shift and normal basilar cisterns. 3. Moderate underlying chronic cerebral white matter disease. Solitary chronic microhemorrhage in the right frontal lobe white matter. 4. MRA today reported separately. Electronically Signed   By: Odessa Fleming M.D.   On: 01/10/2021 05:06   DG Chest Port 1 View  Result Date: 01/09/2021 CLINICAL DATA:  Increased weakness and slurred speech EXAM: PORTABLE CHEST 1 VIEW COMPARISON:  07/21/2016 FINDINGS: Cardiac shadow is enlarged. Postsurgical changes are again noted and stable. Lungs are clear bilaterally. No bony abnormality is seen. IMPRESSION: Stable cardiomegaly.  No acute abnormality noted. Electronically Signed   By: Alcide Clever M.D.   On: 01/09/2021 02:57   CT HEAD CODE STROKE WO CONTRAST  Result Date: 01/09/2021 CLINICAL DATA:  Code stroke.  Neuro deficit, weakness EXAM: CT  HEAD WITHOUT CONTRAST TECHNIQUE: Contiguous axial images were obtained from the base of the skull through the vertex without intravenous contrast. COMPARISON:  04/07/2013 FINDINGS: Brain: No evidence of acute infarction, hemorrhage, cerebral edema, mass, mass effect, or midline shift. Ventricles and sulci are normal for age. No extra-axial fluid collection. Periventricular white matter changes, likely the sequela of chronic small vessel ischemic disease. Vascular: No hyperdense vessel or unexpected calcification. Skull: Normal. Negative for fracture or focal lesion. Sinuses/Orbits: No acute finding. Mild mucosal thickening in the sinuses. Other: The mastoid air cells are well aerated. ASPECTS [facility] Stroke Program Early CT Score) - Ganglionic level infarction (caudate, lentiform nuclei, internal capsule, insula, M1-M3 cortex): 7 - Supraganglionic infarction (M4-M6 cortex): 3 Total score (0-10 with 10 being normal): 10 IMPRESSION: 1. No acute intracranial process. 2. ASPECTS is 10 Code stroke imaging results were communicated on 01/09/2021 at 2:38 am to provider Dr. Dolores Frame Via telephone, who verbally acknowledged these results. Electronically Signed   By: Wiliam Ke M.D.   On: 01/09/2021 02:40   CT ANGIO HEAD NECK W WO CM (CODE STROKE)  Addendum Date: 01/09/2021   ADDENDUM REPORT: 01/09/2021 05:01 ADDENDUM: Critical Value/emergent results were called by telephone at the time of interpretation on 01/09/2021 at 0437 hours to Dr. Lesly Rubenstein SUNG , who verbally acknowledged these results. Electronically Signed   By: Odessa Fleming M.D.   On: 01/09/2021 05:01   Result Date: 01/09/2021 CLINICAL DATA:  60 year old male code stroke presentation. Weakness. EXAM: CT ANGIOGRAPHY HEAD AND NECK TECHNIQUE: Multidetector CT imaging of the head and neck was performed using the  standard protocol during bolus administration of intravenous contrast. Multiplanar CT image reconstructions and MIPs were obtained to evaluate the vascular  anatomy. Carotid stenosis measurements (when applicable) are obtained utilizing NASCET criteria, using the distal internal carotid diameter as the denominator. CONTRAST:  2mL OMNIPAQUE IOHEXOL 350 MG/ML SOLN COMPARISON:  Noncontrast head CT 0226 hours. FINDINGS: CTA NECK Skeleton: Prior sternotomy. Carious left posterior mandible dentition. Cervical spine degeneration. No acute osseous abnormality identified. Upper chest: Mediastinal lipomatosis. Prior CABG. Partially visible cardiomegaly. No evidence of pericardial effusion or mediastinal lymphadenopathy. Visible major airways appear patent, minor atelectasis. Other neck: Negative. Aortic arch: 3 vessel arch configuration. Minor arch atherosclerosis. Right carotid system: Mildly tortuous brachiocephalic artery and proximal right CCA without stenosis. Mild soft plaque proximal to the right carotid bifurcation without stenosis. Partially retropharyngeal course of the bifurcation and right ICA. Mild partially calcified atherosclerosis without stenosis. Left carotid system: Negative left CCA origin. Mildly tortuous left CCA with no significant plaque or stenosis. Partially retropharyngeal left carotid bifurcation and ICA. Mild mostly soft plaque of the proximal ICA without stenosis. Mild tortuosity of the vessel in calcified plaque just below the skull base. Vertebral arteries: Mildly tortuous proximal right subclavian artery without stenosis. Normal right vertebral artery origin. Gradient of decreasing contrast opacification throughout the cervical right vertebral artery which is occluded in the V3 segment leading up to the skull base (series 7, image 161). Mild plaque in the proximal left subclavian artery without stenosis. Mild plaque at the left vertebral artery origin without stenosis. Asymmetrically better enhancement of the cervical left vertebral artery, however, the vessel is tapered and abruptly occluded at the distal V3 segment at the skull base on series  7, image 162. CTA HEAD Posterior circulation: Bilateral proximal vertebral artery V4 segments are occluded, but reconstituted at the PICA origins. Distal right V4 enhances somewhat better. Patent vertebrobasilar junction without stenosis. Patent proximal basilar artery. However, there is a 4-5 mm filling defect in the distal basilar extending to the level of the SCA origins compatible with acute thrombosis. See series 7, image 116 and series 8, image 123. Despite this the basilar tip and PCA origins remain patent. The right SCA is occluded. The left SCA remains patent. Posterior communicating arteries are diminutive or absent. Bilateral PCA branches are patent but irregular, with mild to moderate irregularity and stenosis which appears maximal at the right P3 bifurcation such as series 10, image 56. Anterior circulation: Both ICA siphons are patent. Bilateral siphon atherosclerosis is mild without stenosis. Normal ophthalmic artery origins. Patent carotid termini. Patent MCA and ACA origins. Right A1 appears somewhat dominant. Normal anterior communicating artery. Bilateral ACA branches are within normal limits. Left MCA M1 segment and trifurcation are patent without stenosis. Right MCA M1 segment and bifurcation are patent without stenosis. Bilateral MCA branches are mildly irregular. No MCA occlusion is identified. Venous sinuses: Early contrast timing, grossly patent. Anatomic variants: Mildly dominant right A1. Review of the MIP images confirms the above findings IMPRESSION: 1. 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 Both vertebral arteries occlude at the skull base. And there is a a 4-5 mm segment of thrombus at the Distal Basilar at the level of the SCAs. The Right SCA origin is occluded. But the Basilar tip and PCAs remain patent. There is mild to moderate irregularity and stenosis of the distal PCAs greater on the right. 2. Bilateral  carotid artery atherosclerosis without hemodynamically significant stenosis. Mild intracranial atherosclerosis. 3. Prior CABG.  Mild aortic arch atherosclerosis. Electronically Signed: By: Odessa Fleming M.D. On: 01/09/2021 04:33    Assessment/Plan: Posterior CVA secondary to occluded bilateral VBJs and basilar artery. S/P complete revascularization of occluded basilar artery and RT VBJ with x 1 pass with 68mm x 40 mm solitaire X retriever and aspiration followed by stent assisted angioplasty of underlying high grade stenosis of RT VBJ. Doing well overall, some mild dysarthria. MRI IMPRESSION: 1. Patent Basilar Artery without stenosis. Patent bilateral AICA, SCA, and PCA origins.  2. Patent vertebrobasilar junction, but the distal vertebral arteries are minimally included on this exam and there is stent artifact on the right  Pt to get OOB and work with therapy teams today. Discussed with pt importance of compliance with ASA/Brilinta to maintain patency of the stents.  Pt a little emotional about the cost of medications as well as his hospital costs.  Will see Dr. Corliss Skains in 2 weeks post discharge   LOS: 1 day   I spent a total of 20 minutes in face to face in clinical consultation, greater than 50% of which was counseling/coordinating care for posterior CVA s/p intervention  Brayton El PA-C 01/10/2021 8:53 AM

## 2021-01-10 NOTE — TOC CAGE-AID Note (Signed)
Transition of Care [facility]) - CAGE-AID Screening   Patient Details  Name: Joshua [name] MRN: [id number] Date of Birth: Jul 19, 1960  [facility]) CM/SW Contact:    Tyjon Bowen C Tarpley-Carter, LCSWA Phone Number: 01/10/2021, 11:28 AM   Clinical Narrative: Pt is unable to participate in Cage Aid.  No prior substance or ETOH use history, experiencing slurred speech at this time.  Dianelly Ferran Tarpley-Carter, MSW, LCSW-A Pronouns:  She/Her/Hers [facility] Clinical Social Worker Direct Number:  [phone] [email or web address]   CAGE-AID Screening: Substance Abuse Screening unable to be completed due to: : Patient unable to participate (No prior substance or ETOH use history, experiencing slurred speech at this time.)             Substance Abuse Education Offered: No

## 2021-01-10 NOTE — Progress Notes (Signed)
Inpatient Rehab Admissions Coordinator:  ° °Per OT and SLP recommendations,  patient was screened for CIR candidacy by Aariah Godette, MS, CCC-SLP. At this time, Pt. Appears to be a a potential candidate for CIR. I will place  order for rehab consult per protocol for full assessment. Please contact me any with questions. ° °Doral Digangi, MS, CCC-SLP °Rehab Admissions Coordinator  °[phone] (celll) °[phone] (office) ° ° °

## 2021-01-11 DIAGNOSIS — Z951 Presence of aortocoronary bypass graft: Secondary | ICD-10-CM

## 2021-01-11 DIAGNOSIS — E7800 Pure hypercholesterolemia, unspecified: Secondary | ICD-10-CM

## 2021-01-11 DIAGNOSIS — I5021 Acute systolic (congestive) heart failure: Secondary | ICD-10-CM

## 2021-01-11 MED ORDER — ATORVASTATIN CALCIUM 80 MG PO TABS: 80.0000 mg | ORAL_TABLET | Freq: Every day | ORAL | 1 refills | Status: DC

## 2021-01-11 MED ORDER — TICAGRELOR 90 MG PO TABS: 90.0000 mg | ORAL_TABLET | Freq: Two times a day (BID) | ORAL | 1 refills | Status: DC

## 2021-01-11 MED ORDER — FUROSEMIDE 40 MG PO TABS
40.0000 mg | ORAL_TABLET | Freq: Every day | ORAL | 0 refills | Status: DC
Start: 2021-01-12 — End: 2021-01-14

## 2021-01-11 MED ORDER — SPIRONOLACTONE 25 MG PO TABS: 25.0000 mg | ORAL_TABLET | Freq: Every day | ORAL | 0 refills | Status: DC

## 2021-01-11 MED ORDER — ACETAMINOPHEN 325 MG PO TABS
650.0000 mg | ORAL_TABLET | ORAL | 1 refills | Status: DC | PRN
Start: 2021-01-11 — End: 2021-01-14

## 2021-01-11 MED ORDER — ASPIRIN 81 MG PO CHEW: 81.0000 mg | CHEWABLE_TABLET | Freq: Every day | ORAL | 1 refills | Status: DC

## 2021-01-11 NOTE — Progress Notes (Signed)
STROKE TEAM PROGRESS NOTE   SUBJECTIVE (INTERVAL HISTORY) No family is at the bedside. He is lying in bed, awake alert, neuro stable, no acute event overnight.  PT/OT recommend CIR.  Pending CIR placement.  OBJECTIVE Temp:  [97.7 F (36.5 C)-98.9 F (37.2 C)] 97.7 F (36.5 C) (10/20 1530) Pulse Rate:  [71-79] 75 (10/20 1530) Cardiac Rhythm: Normal sinus rhythm (10/20 0700) Resp:  [17-20] 19 (10/20 1530) BP: (116-156)/(73-119) 156/94 (10/20 1530) SpO2:  [97 %-99 %] 97 % (10/20 1530)  Recent Labs  Lab 01/09/21 0217  GLUCAP 141*   Recent Labs  Lab 01/09/21 0240 01/10/21 0449  NA 137 136  K 4.2 3.8  CL 101 102  CO2 25 21*  GLUCOSE 154* 104*  BUN 22* 13  CREATININE 1.02 1.08  CALCIUM 9.4 8.8*   Recent Labs  Lab 01/09/21 0240  AST 20  ALT 20  ALKPHOS 54  BILITOT 1.3*  PROT 8.3*  ALBUMIN 4.9   Recent Labs  Lab 01/09/21 0240 01/10/21 0449  WBC 11.2* 8.7  NEUTROABS 9.8* 6.5  HGB 18.1* 16.2  HCT 53.0* 47.4  MCV 89.7 89.1  PLT 202 196   No results for input(s): CKTOTAL, CKMB, CKMBINDEX, TROPONINI in the last 168 hours. Recent Labs    01/09/21 0240  LABPROT 13.7  INR 1.1   Recent Labs    01/09/21 0515  COLORURINE YELLOW*  LABSPEC 1.044*  PHURINE 5.0  GLUCOSEU NEGATIVE  HGBUR NEGATIVE  BILIRUBINUR NEGATIVE  KETONESUR 5*  PROTEINUR 30*  NITRITE NEGATIVE  LEUKOCYTESUR NEGATIVE       Component Value Date/Time   CHOL 194 01/10/2021 0449   TRIG 205 (H) 01/10/2021 0449   HDL 30 (L) 01/10/2021 0449   CHOLHDL 6.5 01/10/2021 0449   VLDL 41 (H) 01/10/2021 0449   LDLCALC 123 (H) 01/10/2021 0449   Lab Results  Component Value Date   HGBA1C 5.3 01/10/2021      Component Value Date/Time   LABOPIA NONE DETECTED 01/09/2021 0515   COCAINSCRNUR NONE DETECTED 01/09/2021 0515   LABBENZ NONE DETECTED 01/09/2021 0515   AMPHETMU NONE DETECTED 01/09/2021 0515   THCU NONE DETECTED 01/09/2021 0515   LABBARB NONE DETECTED 01/09/2021 0515    Recent Labs  Lab  01/09/21 0240  ETH <10    I have personally reviewed the radiological images below and agree with the radiology interpretations.  CT HEAD WO CONTRAST  Result Date: 01/09/2021 CLINICAL DATA:  Stroke follow-up. Status post basilar artery thrombectomy. EXAM: CT HEAD WITHOUT CONTRAST TECHNIQUE: Contiguous axial images were obtained from the base of the skull through the vertex without intravenous contrast. COMPARISON:  Head CT earlier today FINDINGS: Brain: There is new mild asymmetric hypodensity in the right cerebellar hemisphere suspicious for an acute infarct. No acute supratentorial infarct, intracranial hemorrhage, midline shift, or extra-axial fluid collection is identified. Hypodensities in the cerebral white matter bilaterally are unchanged and nonspecific but compatible with mild chronic small vessel ischemic disease. The ventricles and sulci are normal. Vascular: Calcified atherosclerosis at the skull base. New stent in the distal right vertebral artery. Residual intravascular contrast material. Skull: No fracture or suspicious osseous lesion. Sinuses/Orbits: Mild mucosal thickening in the paranasal sinuses. Clear mastoid air cells. Unremarkable orbits. Other: None. IMPRESSION: 1. Suspected acute right cerebellar infarct. 2. No intracranial hemorrhage. Electronically Signed   By: Sebastian Ache M.D.   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 Status post endovascular revascularization of basilar artery and right vertebrobasilar junction. Stented right vertebrobasilar junction. Right cerebellar infarcts suspected on postprocedure CT. EXAM: MRA HEAD WITHOUT CONTRAST TECHNIQUE: Angiographic images of the Circle of Willis were acquired using MRA technique without intravenous contrast. COMPARISON:  MRI this morning  reported separately. CTA head and neck yesterday. FINDINGS: Susceptibility artifact in the visible distal right vertebral artery, with preserved flow signal in the distal aspect of the stent. Preserved flow signal at the bilateral vertebrobasilar junction, however, that on the left might be retrograde. Patent basilar artery with mild irregularity, no stenosis. Bilateral AICA, SCA, and PCA origins are patent. Posterior communicating arteries are diminutive or absent. Bilateral PCA branches are patent, mildly irregular in the left P2 segment. Antegrade flow in both ICA siphons. Mild siphon irregularity, no significant siphon stenosis. Patent carotid termini, MCA and ACA origins. Mildly dominant A1 as seen yesterday. Anterior communicating artery, visible ACA branches, right MCA M1 and visible right MCA branches are within normal limits. There is a mild left MCA mid M1 segment irregularity and stenosis which is more apparent than on the CTA yesterday (series 1007, image 10). But the left MCA bifurcation is patent without stenosis. Visible left MCA branches are within normal limits. IMPRESSION: 1. Patent Basilar Artery without stenosis. Patent bilateral AICA, SCA, and PCA origins. 2. Patent vertebrobasilar junction, but the distal vertebral arteries are minimally included on this exam and there is stent artifact on the right. 3. Anterior circulation remarkable for mild bilateral ICA siphon and Left MCA M1 atherosclerosis. Electronically Signed   By: Odessa Fleming M.D.   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 Status post endovascular revascularization of basilar artery and right vertebrobasilar junction. Stented right vertebrobasilar junction. Right cerebellar infarcts suspected on postprocedure CT. EXAM: MRI HEAD WITHOUT CONTRAST TECHNIQUE:  Multiplanar, multiecho pulse sequences of the brain and surrounding structures were obtained without intravenous contrast. COMPARISON:  Head CT 14 10 and 0226 hours yesterday. FINDINGS: Brain: Confluent right superior cerebellar artery territory infarct with restricted diffusion (series 7, image 59). But numerous scattered small additional bilateral cerebellar hemisphere infarcts. Restricted diffusion at the right lateral medulla also on series 5, image 66. And there are several small foci of diffusion restriction also in the right midbrain near the midline (series 5, image 77). Several punctate infarcts in the right thalamus including ventrally on series 5, image 84. Similar scattered small infarcts in the bilateral occipital lobes and occasionally the right parietal lobe. And there are perhaps 1 or 2 punctate infarcts also in the anterior circulation (left middle frontal gyrus series 5, image 101). No evidence of hemorrhagic transformation. Cytotoxic edema at the affected areas, most pronounced in the right superior cerebellum. Mild mass effect on the 4th ventricle, but no ventriculomegaly or other significant intracranial mass effect. Superimposed patchy and scattered chronic cerebral white matter T2 and FLAIR hyperintensity, most pronounced in the periatrial regions. Deep gray matter nuclei are within normal limits. There is a chronic microhemorrhage in the anterior right frontal lobe white matter series 14, image 32. No evidence of mass lesion, extra-axial collection or acute intracranial hemorrhage. Cervicomedullary junction and pituitary are within normal limits. Vascular: Evidence of continued absent flow in the distal left vertebral artery. But other major intracranial vascular  flow voids are preserved, see also MRA today reported separately. Skull and upper cervical spine: Negative visible cervical spine. Visualized bone marrow signal is within normal limits. Sinuses/Orbits: Negative. Other: Trace mastoid  air cell fluid on the right. Visible internal auditory structures appear normal. Negative visible scalp and face. IMPRESSION: 1. Confluent right cerebellar SCA territory infarct with numerous other small infarcts scattered throughout the bilateral posterior circulation, including: Right lateral medulla, bilateral cerebellar hemispheres, right midbrain, right thalamus, bilateral occipital lobes. Occasional punctate anterior circulation infarcts. 2. No associated hemorrhage. Mild mass effect on the 4th ventricle but no ventriculomegaly. No midline shift and normal basilar cisterns. 3. Moderate underlying chronic cerebral white matter disease. Solitary chronic microhemorrhage in the right frontal lobe white matter. 4. MRA today reported separately. Electronically Signed   By: Odessa Fleming M.D.   On: 01/10/2021 05:06   IR US Guide Vasc Access Right  Result Date: 01/11/2021 INDICATION: Speech difficulty, gait instability and bilateral lower extremity weakness. 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 EXAM: 1. EMERGENT LARGE VESSEL OCCLUSION THROMBOLYSIS (POSTERIOR CIRCULATION) COMPARISON:  CT angiogram of the head and neck 01/09/2021. MEDICATIONS: Ancef 2 g IV antibiotic was administered within 1 hour of the procedure. ANESTHESIA/SEDATION: General anesthesia CONTRAST:  Omnipaque 300 approximately 75 cc. FLUOROSCOPY TIME:  Fluoroscopy Time: 92 minutes 40 seconds (7422 mGy). COMPLICATIONS: None immediate. TECHNIQUE: Patient's neurological status and imaging findings were reviewed and discussed with Dr. Thomasena Edis, on-call neuro hospitalist. 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 Reasons were then made for patient's transfer to [facility] for endovascular treatment of the occluded basilar artery. Informed witness consent was not obtainable as the patient was intubated and under general  anesthesia. No family members or next of kin were available via telephone or physically. Given the neuroimaging findings, and patient's clinical condition, an emergent consent was initiated. The patient was then put under general anesthesia by the Department of Anesthesiology at [facility]. The right groin was prepped and draped in the usual sterile fashion. Thereafter using modified Seldinger technique, transfemoral access into the right common femoral artery was obtained without difficulty. Over a 0.035 inch guidewire a 5 [place] Pinnacle 25 cm sheath was inserted. Through this, and also over a 0.035 inch guidewire a 5 [place] JB 1 catheter was advanced to the aortic arch region and selectively positioned in the left common carotid artery, the left vertebral artery, the right common carotid artery and the right vertebral artery. FINDINGS: Non dominant left vertebral artery demonstrates mild stenosis at its origin. The vessel demonstrates slow ascent of contrast to the cranial skull base to complete occlusion at the level of C2. Partial opacification is noted of the left vertebrobasilar junction just distal to the origin of the left posterior-inferior cerebellar artery, the posterior spinal artery. Incomplete opacification is seen of the left posterior-inferior cerebellar artery. 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 On the AP projection there is central prominent vessel which projects superiorly and in the midline opacifying the confluence of the vertebrobasilar junctions and retrogradely the right vertebrobasilar junction with partial opacification of the proximal right posteroinferior cerebellar artery. No clearance of contrast is noted distally. The left common carotid arteriogram demonstrates patency of the left external carotid artery and its major branches. The left internal carotid artery at the bulb to the  cranial skull base  is widely patent. The petrous, the cavernous and the supraclinoid segments are widely patent. The left middle cerebral artery demonstrates a 50% stenosis in the mid M1 segment. More distally the trifurcation branches are widely patent. The left anterior cerebral artery opacifies into the capillary and venous phases. The innominate arteriogram demonstrates patency of the proximal right subclavian artery with moderate tortuosity. The origin of the right vertebral artery is noted with ascent of contrast to the cranial skull base. Right common carotid arteriogram demonstrates the right external carotid artery and its major branches to be widely patent. The right internal carotid artery at the bulb to the cranial skull base is widely patent. The intracranial portion was not examined. PROCEDURE: The 5 French diagnostic catheter was then advanced over a 0.035 inch Roadrunner guidewire to the distal right subclavian artery and exchanged over a stiff Glidewire for a 95 cm Neuron Max sheath inside of which was a 115 cm 5 [place] Catalyst guide catheter. The combination was then advanced and positioned just distal to the origin of the right vertebral artery. The exchange wire was removed. Combination of the 5 [place] Catalyst guide catheter and the Neuron Max sheath was then retrieved proximal to the origin of the right vertebral artery. Multiple attempts were then made using 035 inch guidewires, 016 micro guidewire with 021 microcatheter, and 014 inch micro guidewire with the 021 micro catheters without success. A radial access was then obtained using ultrasound guidance. Initially the morphology of the right radial artery was identified with ultrasound and documented. A dorsal palmar anastomosis was verified to be present. Over a 0.018 inch micro guidewire and a micropuncture set, access was obtained with ultrasound guidance with placement of a 6/7 French radial sheath. The micro guidewire and the obturator  were removed. Good aspiration obtained from the side port of the radial sheath. A cocktail of 2000 units of heparin, and 2.5 mg of verapamil, and 200 mcg of nitroglycerin was then infused without event. Over a 0.035 inch Roadrunner guidewire, a 5 [place] JB 1 catheter, JB 2 catheter and a JB 3 catheter were advanced and multiple attempts were made to cannulate the right vertebral artery without success. This approach was then abandoned. Another attempt was then made through the right common femoral artery puncture site. A 5 [place] JB 3 diagnostic catheter was then advanced over a 0.035 inch glidewire to the distal right subclavian artery. The guidewire was then gently retrieved proximally with the microcatheter retrieved until it engaged the origin of the right vertebral artery. A 0.035 inch Roadrunner guidewire was then advanced more distally into the right vertebral artery followed by the 5 [place] JB 3 catheter. Good aspiration obtained from the hub of the diagnostic catheter following removal of the guidewire. A control arteriogram performed through the diagnostic catheter demonstrated slow ascent contrast to the vertebrobasilar junction with complete occlusion of the dominant right vertebral artery at the level of C1. Over a 300 cm exchange 035 inch guidewire, combination of the Catalyst 115 cm guide catheter inside of an 8 [place] Neuron Max sheath was advanced to the distal right vertebral artery. The guidewire was removed. Good aspiration obtained from the hub of the 5 [place] Catalyst guide catheter. A gentle contrast injection continued to demonstrate occluded dominant right vertebral artery at the level of C1. At this time a combination of an 021 162 cm microcatheter and 014 inch Synchro standard micro guidewire with a moderate J configuration was advanced to the distal end of the 5 [place]  Catalyst guide catheter. Using a torque device, gentle advancement was performed of the micro guidewire followed by the  microcatheter to the site of the occluded dominant right vertebrobasilar junction proximal to the dominant right posteroinferior cerebellar artery and distally just proximal to the basilar artery. Eventually access with the micro guidewire was obtained followed by the microcatheter. The micro guidewire was then advanced to the P2 region of the left posterior cerebral artery followed by the microcatheter. The guidewire was removed. Good aspiration obtained from the hub of the catheter. Gentle control arteriogram demonstrated safe position of the tip of the microcatheter. This was then connected to continuous heparinized saline infusion. A 4 mm x 40 mm Solitaire X retrieval device was then advanced to the distal end of the microcatheter. The O ring on the delivery microcatheter was loosened and the delivery microcatheter was retrieved deploying the retrieval device. The 5 [place] Catalyst guide catheter was now advanced to the occluded segment of the dominant right vertebral artery proximal to the basilar artery. Thereafter constant aspiration was applied with a Penumbra aspiration device at the hub of the Catalyst guide catheter and with a 20 mL syringe at the hub of the Neuron Max sheath for approximately 3 minutes. The combination of the retrieval device, the microcatheter, and the 5 [place] Catalyst guide catheter was retrieved and removed. Following removal, there was free aspiration of blood at the hub of the Neuron Max sheath at the level of the skull base. A control arteriogram performed now demonstrated revascularization of the right vertebrobasilar junction,the basilar artery, the posterior cerebral arteries, the superior cerebellar arteries and the left anterior inferior cerebellar artery. Also there was opacification of the right posterior-inferior cerebellar artery and retrograde flow into the left vertebrobasilar junction. Moderate amount of thrombus was noted entangled in the struts of the retrieval  device. Moderate spasm was noted in the mid basilar artery,the proximal basilar artery and the right vertebrobasilar junction. This responded to 2 aliquots of nitroglycerin intra-arterially. Severe intracranial stenosis was noted at two sites at the right vertebrobasilar junction, one distal and one proximal to the right posterior-inferior cerebellar artery. Measurements were then performed of the distal right vertebrobasilar junction and the proximal right vertebrobasilar junction. It was decided to proceed with placement of a 3 mm x 22 mm Resolute Onyx stent. Over a 0.014 inch standard Synchro micro guidewire, an 021 160 cm microcatheter was advanced coaxially with a 5 [place] Catalyst guide catheter to the distal end of the Neuron Max sheath in the right vertebral artery. Two areas of high-grade stenosis were then crossed with the micro guidewire followed by the microcatheter which was advanced to the distal basilar artery. The micro guidewire was removed. Good aspiration obtained from the hub of the microcatheter. This in turn was replaced with a 300 cm 014 inch Zoom exchange micro guidewire. The distal end of the Zoom guidewire had a mild J configuration. The 3 mm x 22 mm Resolute Onyx balloon mounted stent was then retrogradely purged with heparinized saline infusion, and antegradely with 50% contrast and 50% heparinized saline infusion. Using the rapid exchange technique, the balloon mountable stent delivery apparatus was advanced without difficulty and positioned adequate distance from the distal stenosis and proximal stenosis. This was then deployed with a micro inflation syringe device via micro tubing to 12 atmospheres achieving a 3.05 mm diameter. The balloon was deflated and retrieved and removed. A control arteriogram performed through the Catalyst guide catheter demonstrated significantly improved caliber and flow through the  stented segment at the site of the proximal stenosis. Suboptimal coverage was  noted of the distal stenosis. This necessitated the placement of a second Resolute Onyx stent 3 mm x 15 mm. Having been prepped as described above, the stent apparatus was advanced and positioned such that the distal portion of the second stent was just proximal to the basilar artery. Thereafter, this was then deployed in the usual manner using micro inflation syringe device via micro tubing to over 3 mm at approximately 11 atmospheres. The balloon was deflated and retrieved and removed. A control arteriogram performed through the Catalyst guide catheter in the proximal right vertebrobasilar junction now demonstrated excellent flow through the right vertebral artery, the right vertebrobasilar junction, the posterior cerebral arteries, the superior cerebellar arteries and the left anterior inferior cerebellar artery. No significant flow was noted in the right anterior inferior cerebellar artery. There was retrograde flow into the left vertebrobasilar junction. Control arteriograms were then performed at approximally 10 and 25 minutes after placement of a second stent. These continued to demonstrate excellent flow without evidence of intraluminal filling defects. A TICI 2C revascularization of the vertebrobasilar system was noted as the right anterior inferior cerebellar artery was now angiographically verified. The 5 [place] Catalyst guide catheter was removed. The Neuron Max sheath was then retrieved into the proximal right vertebral artery. A final control arteriogram performed through this demonstrated wide patency of the right vertebral artery intracranially and extracranially with no change in the revascularized posterior fossa. The 8 French Pinnacle sheath was removed. Joshua compression was applied for hemostasis in the right groin puncture site. Distal pulses remained Dopplerable in both feet unchanged. A CT scan of the brain obtained prior to the placement of the intracranial stents, and after continued to  demonstrate no evidence of mass effect, midline shift or of intracranial hemorrhage. Also, the patient was loaded with 180 mg of Brilinta orally via an OG tube prior to placement of the intracranial stents. Also right prior to placement of the stent, the patient was given a half loading dose of Cangrelor IV followed by a 4 hour infusion. CT of the brain was to be obtained at 4 hours of the Cangrelor infusion. The patient's general anesthesia was reversed. Patient was extubated. Upon recovery, the patient was able to obey simple commands appropriately. Denied any headaches or nausea. The patient was able to move all four extremities. His range of movements was within normal limits. He was then transferred to the neuro ICU for post revascularization management. 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 PLAN: Follow-up in clinic 2 weeks post discharge. Electronically Signed   By: Julieanne Cotton M.D.   On: 01/11/2021 08:35   DG Chest Port 1 View  Result Date: 01/09/2021 CLINICAL DATA:  Increased weakness and slurred speech EXAM: PORTABLE CHEST 1 VIEW COMPARISON:  07/21/2016 FINDINGS: Cardiac shadow is enlarged. Postsurgical changes are again noted and stable. Lungs are clear bilaterally. No bony abnormality is seen. IMPRESSION: Stable cardiomegaly.  No acute abnormality noted. Electronically Signed   By: Alcide Clever M.D.   On: 01/09/2021 02:57   ECHOCARDIOGRAM COMPLETE  Result Date: 01/10/2021    ECHOCARDIOGRAM REPORT   Patient Name:  Joshua [name] Date of Exam: 01/10/2021 Medical Rec #:  [id number]   Height:       72.0 in  Accession #:    [id number]  Weight:       263.0 lb Date of Birth:  06/25/1960   BSA:          2.394 m Patient Age:    60 years    BP:           129/97 mmHg Patient Gender: M           HR:           78 bpm. Exam Location:  Inpatient Procedure: 2D Echo, Cardiac Doppler and Color Doppler Indications:     Stroke  History:         Patient has prior history of Echocardiogram examinations, most                  recent 07/21/2016. Risk Factors:Hypertension, Family History of                  Coronary Artery Disease and Former Smoker.  Sonographer:     Alvera Novel Referring Phys:  [id number] Tahir Blank Diagnosing Phys: Thomasene Ripple DO IMPRESSIONS  1. Left ventricular ejection fraction, by estimation, is 35 to 40%. The left ventricle has moderately decreased function. The left ventricle demonstrates global hypokinesis. There is severe concentric left ventricular hypertrophy. Left ventricular diastolic parameters are consistent with Grade I diastolic dysfunction (impaired relaxation).  2. Right ventricular systolic function is normal. The right ventricular size is normal.  3. The mitral valve is normal in structure. Trivial mitral valve regurgitation. No evidence of mitral stenosis.  4. The aortic valve is normal in structure. Aortic valve regurgitation is not visualized. Mild to moderate aortic valve sclerosis/calcification is present, without any evidence of aortic stenosis.  5. Aortic annular calcification. Aortic dilatation noted.  6. The inferior vena cava is normal in size with greater than 50% respiratory variability, suggesting right atrial pressure of 3 mmHg. FINDINGS  Left Ventricle: Left ventricular ejection fraction, by estimation, is 35 to 40%. The left ventricle has moderately decreased function. The left ventricle demonstrates global hypokinesis. Definity contrast agent was given IV to delineate the left ventricular endocardial borders. The left ventricular internal cavity size was normal in size. There is severe  concentric left ventricular hypertrophy. Left ventricular diastolic parameters are consistent with Grade I diastolic dysfunction (impaired relaxation). Right Ventricle: The right ventricular size is normal. No increase in right ventricular wall thickness. Right ventricular systolic function is normal. Left Atrium: Left atrial size was normal in size. Right Atrium: Right atrial size was normal in size. Pericardium: There is no evidence of pericardial effusion. Presence of pericardial fat pad. Mitral Valve: The mitral valve is normal in structure. Mild mitral annular calcification. Trivial mitral valve regurgitation. No evidence of mitral valve stenosis. Tricuspid Valve: The tricuspid valve is normal in structure. Tricuspid valve regurgitation is not demonstrated. No evidence of tricuspid stenosis. Aortic Valve: The aortic valve is normal in structure. Aortic valve regurgitation is not visualized. Mild to moderate aortic valve sclerosis/calcification is present, without any evidence of aortic stenosis. Pulmonic Valve: The pulmonic valve was not well visualized. Pulmonic valve regurgitation is not visualized. No evidence of pulmonic stenosis. Aorta: Aortic annular calcification. Aortic dilatation noted and the aortic root is normal in size and structure. Venous: The inferior vena cava is normal in size with greater than 50% respiratory variability, suggesting right atrial pressure of 3  mmHg. IAS/Shunts: No atrial level shunt detected by color flow Doppler.  LEFT VENTRICLE PLAX 2D LVIDd:         5.35 cm      Diastology LVIDs:         4.60 cm      LV e' medial:    4.13 cm/s LV PW:         1.60 cm      LV E/e' medial:  16.8 LV IVS:        1.60 cm      LV e' lateral:   6.31 cm/s LVOT diam:     2.20 cm      LV E/e' lateral: 11.0 LV SV:         68 LV SV Index:   29 LVOT Area:     3.80 cm  LV Volumes (MOD) LV vol d, MOD A2C: 164.0 ml LV vol d, MOD A4C: 195.0 ml LV vol s, MOD A2C: 98.6 ml LV vol s, MOD A4C: 128.0 ml LV SV MOD  A2C:     65.4 ml LV SV MOD A4C:     195.0 ml LV SV MOD BP:      70.4 ml RIGHT VENTRICLE             IVC RV S prime:     15.30 cm/s  IVC diam: 1.60 cm TAPSE (M-mode): 1.9 cm LEFT ATRIUM             Index        RIGHT ATRIUM           Index LA diam:        2.70 cm 1.13 cm/m   RA Area:     19.40 cm LA Vol (A2C):   35.4 ml 14.79 ml/m  RA Volume:   44.80 ml  18.72 ml/m LA Vol (A4C):   38.3 ml 16.00 ml/m LA Biplane Vol: 37.7 ml 15.75 ml/m  AORTIC VALVE LVOT Vmax:   114.00 cm/s LVOT Vmean:  70.600 cm/s LVOT VTI:    0.180 m  AORTA Ao Root diam: 3.70 cm Ao Asc diam:  3.40 cm MITRAL VALVE               TRICUSPID VALVE MV Area (PHT): 3.27 cm    TR Peak grad:   8.9 mmHg MV Decel Time: 232 msec    TR Vmax:        149.00 cm/s MV E velocity: 69.50 cm/s MV A velocity: 77.10 cm/s  SHUNTS MV E/A ratio:  0.90        Systemic VTI:  0.18 m                            Systemic Diam: 2.20 cm Kardie Tobb DO Electronically signed by Thomasene Ripple DO Signature Date/Time: 01/10/2021/12:47:06 PM    Final    IR PERCUTANEOUS ART THROMBECTOMY/INFUSION INTRACRANIAL INC DIAG ANGIO  Result Date: 01/11/2021 INDICATION: Speech difficulty, gait instability and bilateral lower extremity weakness. 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 EXAM: 1. EMERGENT LARGE VESSEL OCCLUSION THROMBOLYSIS (POSTERIOR CIRCULATION) COMPARISON:  CT angiogram of the head and neck 01/09/2021. MEDICATIONS: Ancef 2 g IV antibiotic was administered within 1 hour of the procedure. ANESTHESIA/SEDATION: General anesthesia CONTRAST:  Omnipaque 300 approximately 75 cc. FLUOROSCOPY TIME:  Fluoroscopy Time: 92 minutes 40 seconds (7422 mGy). COMPLICATIONS: None immediate. TECHNIQUE: Patient's neurological status and imaging findings were reviewed and  discussed with Dr. Thomasena Edis, on-call neuro hospitalist. 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  Reasons were then made for patient's transfer to [facility] for endovascular treatment of the occluded basilar artery. Informed witness consent was not obtainable as the patient was intubated and under general anesthesia. No family members or next of kin were available via telephone or physically. Given the neuroimaging findings, and patient's clinical condition, an emergent consent was initiated. The patient was then put under general anesthesia by the Department of Anesthesiology at [facility]. The right groin was prepped and draped in the usual sterile fashion. Thereafter using modified Seldinger technique, transfemoral access into the right common femoral artery was obtained without difficulty. Over a 0.035 inch guidewire a 5 [place] Pinnacle 25 cm sheath was inserted. Through this, and also over a 0.035 inch guidewire a 5 [place] JB 1 catheter was advanced to the aortic arch region and selectively positioned in the left common carotid artery, the left vertebral artery, the right common carotid artery and the right vertebral artery. FINDINGS: Non dominant left vertebral artery demonstrates mild stenosis at its origin. The vessel demonstrates slow ascent of contrast to the cranial skull base to complete occlusion at the level of C2. Partial opacification is noted of the left vertebrobasilar junction just distal to the origin of the left posterior-inferior cerebellar artery, the posterior spinal artery. Incomplete opacification is seen of the left posterior-inferior cerebellar artery. 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 On the AP projection there is central prominent vessel which projects superiorly and in the midline opacifying the confluence of the vertebrobasilar junctions and retrogradely the right vertebrobasilar junction with partial opacification of the proximal right posteroinferior cerebellar  artery. No clearance of contrast is noted distally. The left common carotid arteriogram demonstrates patency of the left external carotid artery and its major branches. The left internal carotid artery at the bulb to the cranial skull base is widely patent. The petrous, the cavernous and the supraclinoid segments are widely patent. The left middle cerebral artery demonstrates a 50% stenosis in the mid M1 segment. More distally the trifurcation branches are widely patent. The left anterior cerebral artery opacifies into the capillary and venous phases. The innominate arteriogram demonstrates patency of the proximal right subclavian artery with moderate tortuosity. The origin of the right vertebral artery is noted with ascent of contrast to the cranial skull base. Right common carotid arteriogram demonstrates the right external carotid artery and its major branches to be widely patent. The right internal carotid artery at the bulb to the cranial skull base is widely patent. The intracranial portion was not examined. PROCEDURE: The 5 French diagnostic catheter was then advanced over a 0.035 inch Roadrunner guidewire to the distal right subclavian artery and exchanged over a stiff Glidewire for a 95 cm Neuron Max sheath inside of which was a 115 cm 5 [place] Catalyst guide catheter. The combination was then advanced and positioned just distal to the origin of the right vertebral artery. The exchange wire was removed. Combination of the 5 [place] Catalyst guide catheter and the Neuron Max sheath was then retrieved proximal to the origin of the right vertebral artery. Multiple attempts were then made using 035 inch guidewires, 016 micro guidewire with 021 microcatheter, and 014 inch micro guidewire with the 021 micro catheters without success. A radial access was then obtained  using ultrasound guidance. Initially the morphology of the right radial artery was identified with ultrasound and documented. A dorsal palmar  anastomosis was verified to be present. Over a 0.018 inch micro guidewire and a micropuncture set, access was obtained with ultrasound guidance with placement of a 6/7 French radial sheath. The micro guidewire and the obturator were removed. Good aspiration obtained from the side port of the radial sheath. A cocktail of 2000 units of heparin, and 2.5 mg of verapamil, and 200 mcg of nitroglycerin was then infused without event. Over a 0.035 inch Roadrunner guidewire, a 5 [place] JB 1 catheter, JB 2 catheter and a JB 3 catheter were advanced and multiple attempts were made to cannulate the right vertebral artery without success. This approach was then abandoned. Another attempt was then made through the right common femoral artery puncture site. A 5 [place] JB 3 diagnostic catheter was then advanced over a 0.035 inch glidewire to the distal right subclavian artery. The guidewire was then gently retrieved proximally with the microcatheter retrieved until it engaged the origin of the right vertebral artery. A 0.035 inch Roadrunner guidewire was then advanced more distally into the right vertebral artery followed by the 5 [place] JB 3 catheter. Good aspiration obtained from the hub of the diagnostic catheter following removal of the guidewire. A control arteriogram performed through the diagnostic catheter demonstrated slow ascent contrast to the vertebrobasilar junction with complete occlusion of the dominant right vertebral artery at the level of C1. Over a 300 cm exchange 035 inch guidewire, combination of the Catalyst 115 cm guide catheter inside of an 8 [place] Neuron Max sheath was advanced to the distal right vertebral artery. The guidewire was removed. Good aspiration obtained from the hub of the 5 [place] Catalyst guide catheter. A gentle contrast injection continued to demonstrate occluded dominant right vertebral artery at the level of C1. At this time a combination of an 021 162 cm microcatheter and 014 inch  Synchro standard micro guidewire with a moderate J configuration was advanced to the distal end of the 5 [place] Catalyst guide catheter. Using a torque device, gentle advancement was performed of the micro guidewire followed by the microcatheter to the site of the occluded dominant right vertebrobasilar junction proximal to the dominant right posteroinferior cerebellar artery and distally just proximal to the basilar artery. Eventually access with the micro guidewire was obtained followed by the microcatheter. The micro guidewire was then advanced to the P2 region of the left posterior cerebral artery followed by the microcatheter. The guidewire was removed. Good aspiration obtained from the hub of the catheter. Gentle control arteriogram demonstrated safe position of the tip of the microcatheter. This was then connected to continuous heparinized saline infusion. A 4 mm x 40 mm Solitaire X retrieval device was then advanced to the distal end of the microcatheter. The O ring on the delivery microcatheter was loosened and the delivery microcatheter was retrieved deploying the retrieval device. The 5 [place] Catalyst guide catheter was now advanced to the occluded segment of the dominant right vertebral artery proximal to the basilar artery. Thereafter constant aspiration was applied with a Penumbra aspiration device at the hub of the Catalyst guide catheter and with a 20 mL syringe at the hub of the Neuron Max sheath for approximately 3 minutes. The combination of the retrieval device, the microcatheter, and the 5 [place] Catalyst guide catheter was retrieved and removed. Following removal, there was free aspiration of blood at the hub of the Neuron Max sheath  at the level of the skull base. A control arteriogram performed now demonstrated revascularization of the right vertebrobasilar junction,the basilar artery, the posterior cerebral arteries, the superior cerebellar arteries and the left anterior inferior cerebellar  artery. Also there was opacification of the right posterior-inferior cerebellar artery and retrograde flow into the left vertebrobasilar junction. Moderate amount of thrombus was noted entangled in the struts of the retrieval device. Moderate spasm was noted in the mid basilar artery,the proximal basilar artery and the right vertebrobasilar junction. This responded to 2 aliquots of nitroglycerin intra-arterially. Severe intracranial stenosis was noted at two sites at the right vertebrobasilar junction, one distal and one proximal to the right posterior-inferior cerebellar artery. Measurements were then performed of the distal right vertebrobasilar junction and the proximal right vertebrobasilar junction. It was decided to proceed with placement of a 3 mm x 22 mm Resolute Onyx stent. Over a 0.014 inch standard Synchro micro guidewire, an 021 160 cm microcatheter was advanced coaxially with a 5 [place] Catalyst guide catheter to the distal end of the Neuron Max sheath in the right vertebral artery. Two areas of high-grade stenosis were then crossed with the micro guidewire followed by the microcatheter which was advanced to the distal basilar artery. The micro guidewire was removed. Good aspiration obtained from the hub of the microcatheter. This in turn was replaced with a 300 cm 014 inch Zoom exchange micro guidewire. The distal end of the Zoom guidewire had a mild J configuration. The 3 mm x 22 mm Resolute Onyx balloon mounted stent was then retrogradely purged with heparinized saline infusion, and antegradely with 50% contrast and 50% heparinized saline infusion. Using the rapid exchange technique, the balloon mountable stent delivery apparatus was advanced without difficulty and positioned adequate distance from the distal stenosis and proximal stenosis. This was then deployed with a micro inflation syringe device via micro tubing to 12 atmospheres achieving a 3.05 mm diameter. The balloon was deflated and  retrieved and removed. A control arteriogram performed through the Catalyst guide catheter demonstrated significantly improved caliber and flow through the stented segment at the site of the proximal stenosis. Suboptimal coverage was noted of the distal stenosis. This necessitated the placement of a second Resolute Onyx stent 3 mm x 15 mm. Having been prepped as described above, the stent apparatus was advanced and positioned such that the distal portion of the second stent was just proximal to the basilar artery. Thereafter, this was then deployed in the usual manner using micro inflation syringe device via micro tubing to over 3 mm at approximately 11 atmospheres. The balloon was deflated and retrieved and removed. A control arteriogram performed through the Catalyst guide catheter in the proximal right vertebrobasilar junction now demonstrated excellent flow through the right vertebral artery, the right vertebrobasilar junction, the posterior cerebral arteries, the superior cerebellar arteries and the left anterior inferior cerebellar artery. No significant flow was noted in the right anterior inferior cerebellar artery. There was retrograde flow into the left vertebrobasilar junction. Control arteriograms were then performed at approximally 10 and 25 minutes after placement of a second stent. These continued to demonstrate excellent flow without evidence of intraluminal filling defects. A TICI 2C revascularization of the vertebrobasilar system was noted as the right anterior inferior cerebellar artery was now angiographically verified. The 5 [place] Catalyst guide catheter was removed. The Neuron Max sheath was then retrieved into the proximal right vertebral artery. A final control arteriogram performed through this demonstrated wide patency of the right vertebral artery intracranially  and extracranially with no change in the revascularized posterior fossa. The 8 French Pinnacle sheath was removed. Joshua  compression was applied for hemostasis in the right groin puncture site. Distal pulses remained Dopplerable in both feet unchanged. A CT scan of the brain obtained prior to the placement of the intracranial stents, and after continued to demonstrate no evidence of mass effect, midline shift or of intracranial hemorrhage. Also, the patient was loaded with 180 mg of Brilinta orally via an OG tube prior to placement of the intracranial stents. Also right prior to placement of the stent, the patient was given a half loading dose of Cangrelor IV followed by a 4 hour infusion. CT of the brain was to be obtained at 4 hours of the Cangrelor infusion. The patient's general anesthesia was reversed. Patient was extubated. Upon recovery, the patient was able to obey simple commands appropriately. Denied any headaches or nausea. The patient was able to move all four extremities. His range of movements was within normal limits. He was then transferred to the neuro ICU for post revascularization management. 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 PLAN: Follow-up in clinic 2 weeks post discharge. Electronically Signed   By: Julieanne Cotton M.D.   On: 01/11/2021 08:35   CT HEAD CODE STROKE WO CONTRAST  Result Date: 01/09/2021 CLINICAL DATA:  Code stroke.  Neuro deficit, weakness EXAM: CT HEAD WITHOUT CONTRAST TECHNIQUE: Contiguous axial images were obtained from the base of the skull through the vertex without intravenous contrast. COMPARISON:  04/07/2013 FINDINGS: Brain: No evidence of acute infarction, hemorrhage, cerebral edema, mass, mass  effect, or midline shift. Ventricles and sulci are normal for age. No extra-axial fluid collection. Periventricular white matter changes, likely the sequela of chronic small vessel ischemic disease. Vascular: No hyperdense vessel or unexpected calcification. Skull: Normal. Negative for fracture or focal lesion. Sinuses/Orbits: No acute finding. Mild mucosal thickening in the sinuses. Other: The mastoid air cells are well aerated. ASPECTS [facility] Stroke Program Early CT Score) - Ganglionic level infarction (caudate, lentiform nuclei, internal capsule, insula, M1-M3 cortex): 7 - Supraganglionic infarction (M4-M6 cortex): 3 Total score (0-10 with 10 being normal): 10 IMPRESSION: 1. No acute intracranial process. 2. ASPECTS is 10 Code stroke imaging results were communicated on 01/09/2021 at 2:38 am to provider Dr. Dolores Frame Via telephone, who verbally acknowledged these results. Electronically Signed   By: Wiliam Ke M.D.   On: 01/09/2021 02:40   CT ANGIO HEAD NECK W WO CM (CODE STROKE)  Addendum Date: 01/09/2021   ADDENDUM REPORT: 01/09/2021 05:01 ADDENDUM: Critical Value/emergent results were called by telephone at the time of interpretation on 01/09/2021 at 0437 hours to Dr. Lesly Rubenstein SUNG , who verbally acknowledged these results. Electronically Signed   By: Odessa Fleming M.D.   On: 01/09/2021 05:01   Result Date: 01/09/2021 CLINICAL DATA:  60 year old male code stroke presentation. Weakness. EXAM: CT ANGIOGRAPHY HEAD AND NECK TECHNIQUE: Multidetector CT imaging of the head and neck was performed using the standard protocol during bolus administration of intravenous contrast. Multiplanar CT image reconstructions and MIPs were obtained to evaluate the vascular anatomy. Carotid stenosis measurements (when applicable) are obtained utilizing NASCET criteria, using the distal internal carotid diameter  as the denominator. CONTRAST:  32mL OMNIPAQUE IOHEXOL 350 MG/ML SOLN COMPARISON:  Noncontrast head CT 0226 hours. FINDINGS:  CTA NECK Skeleton: Prior sternotomy. Carious left posterior mandible dentition. Cervical spine degeneration. No acute osseous abnormality identified. Upper chest: Mediastinal lipomatosis. Prior CABG. Partially visible cardiomegaly. No evidence of pericardial effusion or mediastinal lymphadenopathy. Visible major airways appear patent, minor atelectasis. Other neck: Negative. Aortic arch: 3 vessel arch configuration. Minor arch atherosclerosis. Right carotid system: Mildly tortuous brachiocephalic artery and proximal right CCA without stenosis. Mild soft plaque proximal to the right carotid bifurcation without stenosis. Partially retropharyngeal course of the bifurcation and right ICA. Mild partially calcified atherosclerosis without stenosis. Left carotid system: Negative left CCA origin. Mildly tortuous left CCA with no significant plaque or stenosis. Partially retropharyngeal left carotid bifurcation and ICA. Mild mostly soft plaque of the proximal ICA without stenosis. Mild tortuosity of the vessel in calcified plaque just below the skull base. Vertebral arteries: Mildly tortuous proximal right subclavian artery without stenosis. Normal right vertebral artery origin. Gradient of decreasing contrast opacification throughout the cervical right vertebral artery which is occluded in the V3 segment leading up to the skull base (series 7, image 161). Mild plaque in the proximal left subclavian artery without stenosis. Mild plaque at the left vertebral artery origin without stenosis. Asymmetrically better enhancement of the cervical left vertebral artery, however, the vessel is tapered and abruptly occluded at the distal V3 segment at the skull base on series 7, image 162. CTA HEAD Posterior circulation: Bilateral proximal vertebral artery V4 segments are occluded, but reconstituted at the PICA origins. Distal right V4 enhances somewhat better. Patent vertebrobasilar junction without stenosis. Patent proximal basilar  artery. However, there is a 4-5 mm filling defect in the distal basilar extending to the level of the SCA origins compatible with acute thrombosis. See series 7, image 116 and series 8, image 123. Despite this the basilar tip and PCA origins remain patent. The right SCA is occluded. The left SCA remains patent. Posterior communicating arteries are diminutive or absent. Bilateral PCA branches are patent but irregular, with mild to moderate irregularity and stenosis which appears maximal at the right P3 bifurcation such as series 10, image 56. Anterior circulation: Both ICA siphons are patent. Bilateral siphon atherosclerosis is mild without stenosis. Normal ophthalmic artery origins. Patent carotid termini. Patent MCA and ACA origins. Right A1 appears somewhat dominant. Normal anterior communicating artery. Bilateral ACA branches are within normal limits. Left MCA M1 segment and trifurcation are patent without stenosis. Right MCA M1 segment and bifurcation are patent without stenosis. Bilateral MCA branches are mildly irregular. No MCA occlusion is identified. Venous sinuses: Early contrast timing, grossly patent. Anatomic variants: Mildly dominant right A1. Review of the MIP images confirms the above findings IMPRESSION: 1. 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 Both vertebral arteries occlude at the skull base. And there is a a 4-5 mm segment of thrombus at the Distal Basilar at the level of the SCAs. The Right SCA origin is occluded. But the Basilar tip and PCAs remain patent. There is mild to moderate irregularity and stenosis of the distal PCAs greater on the right. 2. Bilateral carotid artery atherosclerosis without hemodynamically significant stenosis. Mild intracranial atherosclerosis. 3. Prior CABG.  Mild aortic arch atherosclerosis. Electronically Signed: By: Odessa Fleming M.D. On: 01/09/2021 04:33   IR ANGIO INTRA EXTRACRAN SEL COM  CAROTID INNOMINATE UNI R MOD SED  Result Date: 01/11/2021 INDICATION: Speech difficulty, gait  instability and bilateral lower extremity weakness. 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 EXAM: 1. EMERGENT LARGE VESSEL OCCLUSION THROMBOLYSIS (POSTERIOR CIRCULATION) COMPARISON:  CT angiogram of the head and neck 01/09/2021. MEDICATIONS: Ancef 2 g IV antibiotic was administered within 1 hour of the procedure. ANESTHESIA/SEDATION: General anesthesia CONTRAST:  Omnipaque 300 approximately 75 cc. FLUOROSCOPY TIME:  Fluoroscopy Time: 92 minutes 40 seconds (7422 mGy). COMPLICATIONS: None immediate. TECHNIQUE: Patient's neurological status and imaging findings were reviewed and discussed with Dr. Thomasena Edis, on-call neuro hospitalist. 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 Reasons were then made for patient's transfer to [facility] for endovascular treatment of the occluded basilar artery. Informed witness consent was not obtainable as the patient was intubated and under general anesthesia. No family members or next of kin were available via telephone or physically. Given the neuroimaging findings, and patient's clinical condition, an emergent consent was initiated. The patient was then put under general anesthesia by the Department of Anesthesiology at [facility] The right groin was prepped and draped in the usual sterile fashion. Thereafter using modified Seldinger technique, transfemoral access into the right common femoral artery was obtained without difficulty. Over a 0.035 inch guidewire a 5 [place] Pinnacle 25 cm sheath was inserted. Through this, and also over a 0.035 inch guidewire a 5 [place] JB 1 catheter was advanced to the aortic arch region and selectively positioned in the left common carotid artery, the left vertebral artery, the right common carotid artery and  the right vertebral artery. FINDINGS: Non dominant left vertebral artery demonstrates mild stenosis at its origin. The vessel demonstrates slow ascent of contrast to the cranial skull base to complete occlusion at the level of C2. Partial opacification is noted of the left vertebrobasilar junction just distal to the origin of the left posterior-inferior cerebellar artery, the posterior spinal artery. Incomplete opacification is seen of the left posterior-inferior cerebellar artery. 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 On the AP projection there is central prominent vessel which projects superiorly and in the midline opacifying the confluence of the vertebrobasilar junctions and retrogradely the right vertebrobasilar junction with partial opacification of the proximal right posteroinferior cerebellar artery. No clearance of contrast is noted distally. The left common carotid arteriogram demonstrates patency of the left external carotid artery and its major branches. The left internal carotid artery at the bulb to the cranial skull base is widely patent. The petrous, the cavernous and the supraclinoid segments are widely patent. The left middle cerebral artery demonstrates a 50% stenosis in the mid M1 segment. More distally the trifurcation branches are widely patent. The left anterior cerebral artery opacifies into the capillary and venous phases. The innominate arteriogram demonstrates patency of the proximal right subclavian artery with moderate tortuosity. The origin of the right vertebral artery is noted with ascent of contrast to the cranial skull base. Right common carotid arteriogram demonstrates the right external carotid artery and its major branches to be widely patent. The right internal carotid artery at the bulb to the cranial skull base is widely patent. The intracranial portion was not examined. PROCEDURE: The 5  French diagnostic catheter was then advanced over a 0.035 inch Roadrunner guidewire to the distal right subclavian artery and exchanged over a stiff Glidewire for a 95 cm Neuron Max sheath inside of which was a 115 cm 5  [place] Catalyst guide catheter. The combination was then advanced and positioned just distal to the origin of the right vertebral artery. The exchange wire was removed. Combination of the 5 [place] Catalyst guide catheter and the Neuron Max sheath was then retrieved proximal to the origin of the right vertebral artery. Multiple attempts were then made using 035 inch guidewires, 016 micro guidewire with 021 microcatheter, and 014 inch micro guidewire with the 021 micro catheters without success. A radial access was then obtained using ultrasound guidance. Initially the morphology of the right radial artery was identified with ultrasound and documented. A dorsal palmar anastomosis was verified to be present. Over a 0.018 inch micro guidewire and a micropuncture set, access was obtained with ultrasound guidance with placement of a 6/7 French radial sheath. The micro guidewire and the obturator were removed. Good aspiration obtained from the side port of the radial sheath. A cocktail of 2000 units of heparin, and 2.5 mg of verapamil, and 200 mcg of nitroglycerin was then infused without event. Over a 0.035 inch Roadrunner guidewire, a 5 [place] JB 1 catheter, JB 2 catheter and a JB 3 catheter were advanced and multiple attempts were made to cannulate the right vertebral artery without success. This approach was then abandoned. Another attempt was then made through the right common femoral artery puncture site. A 5 [place] JB 3 diagnostic catheter was then advanced over a 0.035 inch glidewire to the distal right subclavian artery. The guidewire was then gently retrieved proximally with the microcatheter retrieved until it engaged the origin of the right vertebral artery. A 0.035 inch Roadrunner guidewire was  then advanced more distally into the right vertebral artery followed by the 5 [place] JB 3 catheter. Good aspiration obtained from the hub of the diagnostic catheter following removal of the guidewire. A control arteriogram performed through the diagnostic catheter demonstrated slow ascent contrast to the vertebrobasilar junction with complete occlusion of the dominant right vertebral artery at the level of C1. Over a 300 cm exchange 035 inch guidewire, combination of the Catalyst 115 cm guide catheter inside of an 8 [place] Neuron Max sheath was advanced to the distal right vertebral artery. The guidewire was removed. Good aspiration obtained from the hub of the 5 [place] Catalyst guide catheter. A gentle contrast injection continued to demonstrate occluded dominant right vertebral artery at the level of C1. At this time a combination of an 021 162 cm microcatheter and 014 inch Synchro standard micro guidewire with a moderate J configuration was advanced to the distal end of the 5 [place] Catalyst guide catheter. Using a torque device, gentle advancement was performed of the micro guidewire followed by the microcatheter to the site of the occluded dominant right vertebrobasilar junction proximal to the dominant right posteroinferior cerebellar artery and distally just proximal to the basilar artery. Eventually access with the micro guidewire was obtained followed by the microcatheter. The micro guidewire was then advanced to the P2 region of the left posterior cerebral artery followed by the microcatheter. The guidewire was removed. Good aspiration obtained from the hub of the catheter. Gentle control arteriogram demonstrated safe position of the tip of the microcatheter. This was then connected to continuous heparinized saline infusion. A 4 mm x 40 mm Solitaire X retrieval device was then advanced to the distal end of the microcatheter. The O ring on the delivery microcatheter was loosened and the delivery  microcatheter was retrieved deploying the retrieval device. The 5 [place] Catalyst guide catheter was now advanced to the  occluded segment of the dominant right vertebral artery proximal to the basilar artery. Thereafter constant aspiration was applied with a Penumbra aspiration device at the hub of the Catalyst guide catheter and with a 20 mL syringe at the hub of the Neuron Max sheath for approximately 3 minutes. The combination of the retrieval device, the microcatheter, and the 5 [place] Catalyst guide catheter was retrieved and removed. Following removal, there was free aspiration of blood at the hub of the Neuron Max sheath at the level of the skull base. A control arteriogram performed now demonstrated revascularization of the right vertebrobasilar junction,the basilar artery, the posterior cerebral arteries, the superior cerebellar arteries and the left anterior inferior cerebellar artery. Also there was opacification of the right posterior-inferior cerebellar artery and retrograde flow into the left vertebrobasilar junction. Moderate amount of thrombus was noted entangled in the struts of the retrieval device. Moderate spasm was noted in the mid basilar artery,the proximal basilar artery and the right vertebrobasilar junction. This responded to 2 aliquots of nitroglycerin intra-arterially. Severe intracranial stenosis was noted at two sites at the right vertebrobasilar junction, one distal and one proximal to the right posterior-inferior cerebellar artery. Measurements were then performed of the distal right vertebrobasilar junction and the proximal right vertebrobasilar junction. It was decided to proceed with placement of a 3 mm x 22 mm Resolute Onyx stent. Over a 0.014 inch standard Synchro micro guidewire, an 021 160 cm microcatheter was advanced coaxially with a 5 [place] Catalyst guide catheter to the distal end of the Neuron Max sheath in the right vertebral artery. Two areas of high-grade stenosis were  then crossed with the micro guidewire followed by the microcatheter which was advanced to the distal basilar artery. The micro guidewire was removed. Good aspiration obtained from the hub of the microcatheter. This in turn was replaced with a 300 cm 014 inch Zoom exchange micro guidewire. The distal end of the Zoom guidewire had a mild J configuration. The 3 mm x 22 mm Resolute Onyx balloon mounted stent was then retrogradely purged with heparinized saline infusion, and antegradely with 50% contrast and 50% heparinized saline infusion. Using the rapid exchange technique, the balloon mountable stent delivery apparatus was advanced without difficulty and positioned adequate distance from the distal stenosis and proximal stenosis. This was then deployed with a micro inflation syringe device via micro tubing to 12 atmospheres achieving a 3.05 mm diameter. The balloon was deflated and retrieved and removed. A control arteriogram performed through the Catalyst guide catheter demonstrated significantly improved caliber and flow through the stented segment at the site of the proximal stenosis. Suboptimal coverage was noted of the distal stenosis. This necessitated the placement of a second Resolute Onyx stent 3 mm x 15 mm. Having been prepped as described above, the stent apparatus was advanced and positioned such that the distal portion of the second stent was just proximal to the basilar artery. Thereafter, this was then deployed in the usual manner using micro inflation syringe device via micro tubing to over 3 mm at approximately 11 atmospheres. The balloon was deflated and retrieved and removed. A control arteriogram performed through the Catalyst guide catheter in the proximal right vertebrobasilar junction now demonstrated excellent flow through the right vertebral artery, the right vertebrobasilar junction, the posterior cerebral arteries, the superior cerebellar arteries and the left anterior inferior cerebellar  artery. No significant flow was noted in the right anterior inferior cerebellar artery. There was retrograde flow into the left vertebrobasilar junction. Control arteriograms were  then performed at approximally 10 and 25 minutes after placement of a second stent. These continued to demonstrate excellent flow without evidence of intraluminal filling defects. A TICI 2C revascularization of the vertebrobasilar system was noted as the right anterior inferior cerebellar artery was now angiographically verified. The 5 [place] Catalyst guide catheter was removed. The Neuron Max sheath was then retrieved into the proximal right vertebral artery. A final control arteriogram performed through this demonstrated wide patency of the right vertebral artery intracranially and extracranially with no change in the revascularized posterior fossa. The 8 French Pinnacle sheath was removed. Joshua compression was applied for hemostasis in the right groin puncture site. Distal pulses remained Dopplerable in both feet unchanged. A CT scan of the brain obtained prior to the placement of the intracranial stents, and after continued to demonstrate no evidence of mass effect, midline shift or of intracranial hemorrhage. Also, the patient was loaded with 180 mg of Brilinta orally via an OG tube prior to placement of the intracranial stents. Also right prior to placement of the stent, the patient was given a half loading dose of Cangrelor IV followed by a 4 hour infusion. CT of the brain was to be obtained at 4 hours of the Cangrelor infusion. The patient's general anesthesia was reversed. Patient was extubated. Upon recovery, the patient was able to obey simple commands appropriately. Denied any headaches or nausea. The patient was able to move all four extremities. His range of movements was within normal limits. He was then transferred to the neuro ICU for post revascularization management. IMPRESSION: Status post endovascular complete  revascularization of occluded dominant right vertebrobasilar junction and basilar artery with 1 pass with a 4 mm x 40 mm Solitaire X retrieval device, and aspiration achieving a TICI 2C revascularization. 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 PLAN: Follow-up in clinic 2 weeks post discharge. Electronically Signed   By: Julieanne Cotton M.D.   On: 01/11/2021 08:35      PHYSICAL EXAM  Temp:  [97.7 F (36.5 C)-98.9 F (37.2 C)] 97.7 F (36.5 C) (10/20 1530) Pulse Rate:  [71-79] 75 (10/20 1530) Resp:  [17-20] 19 (10/20 1530) BP: (116-156)/(73-119) 156/94 (10/20 1530) SpO2:  [97 %-99 %] 97 % (10/20 1530)  General - Well nourished, well developed, in no apparent distress.  Ophthalmologic - fundi not visualized due to noncooperation.  Cardiovascular - Regular rhythm and rate.  Neuro - awake, alert, eyes open, orientated to age, place, and time. No aphasia, fluent language, able to name and repeat, following all simple commands. Slight dysarthria. No gaze palsy, however, with lateral gaze showed unsustained b/l bidirectional nystagmus, no disconjugate eyes. tracking bilaterally, visual field full, PERRL. Slight right nasolabial fold flattening. Tongue midline. Bilateral UEs 5/5, no drift. Bilaterally LEs equal strength. Sensation symmetric today, R HTN and FTN slight dysmetria, gait not tested.     ASSESSMENT/PLAN Joshua [name] is a 60 y.o. male with history of CHF, CAD/NSTEMI s/p CABG, HTN, obesity admitted for speech difficulty, b/l LE weakness and gait difficulty. No tPA given due to outside window.    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 Cardioembolic source is also in DDx CT no acute finding CTA head and neck - b/l V3 and V4 occlusion, BA thrombus at [facility]  level with right SCA occlusion S/p IR occluded bilateral  VBJs  and BA with TICI2c and right VBJ stenting CT repeat suspect small right cerebellar infarct MRI Confluent right cerebellar SCA territory infarct with numerous other small infarcts scattered throughout the bilateral posterior circulation, including: Right lateral medulla, bilateral cerebellar hemispheres, right midbrain, right thalamus, bilateral occipital lobes. MRA posterior circulation patent now 2D Echo EF 35 to 40% LDL 123 HgbA1c 5.3 SCDs for VTE prophylaxis aspirin 81 mg daily prior to admission, now on aspirin 81 mg daily and Brilinta (ticagrelor) 90 mg bid. Ongoing aggressive stroke risk factor management Therapy recommendations:  CIR  Disposition:  pending  CHF  Home meds - coreg, entresto, lasix, spironolactone 06/2016 EF 20 to 25% 11/2016 EF 35 to 40% This admission TTE EF 35 to 40% On coreg, lasix and spironolactone  Gentle hydration  Hypertension stable Off cleviprex On coreg, lasix and spironolactone  Long term BP goal normotensive  Hyperlipidemia Home meds:  lipitor 40  LDL 123, goal < 70 On lipitor 80 Continue statin at discharge  Other Stroke Risk Factors Obesity, There is no height or weight on file to calculate BMI.  Coronary artery disease s/p CABG  Other Active Problems Leukocytosis WBC 11.2->8.7 Polycythemia Hb 18.1->16.2  Hospital day # 2   Marvel Plan, MD PhD Stroke Neurology 01/11/2021 8:11 PM    To contact Stroke Continuity provider, please refer to [email or web address]. After hours, contact General Neurology

## 2021-01-11 NOTE — Progress Notes (Signed)
Physical Therapy Treatment Patient Details Name: Joshua [name] MRN: [id number] DOB: 19-Apr-1960 Today's Date: 01/11/2021   History of Present Illness 60 y/o male presented to [facility] ED on 10/18 with speech difficulty and gait instability/bilateral LE weakness. Transferred to [facility]. 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 S/p thrombectomy on 10/18. PMH: CAD, HTN, CHF, NSTEMI.    PT Comments    Pt received in supine, pleasantly participatory for functional mobility tasks. Pt with continued cognitive deficits including attention, executive functioning/planning and slow processing. Pt with increased RLE ataxia and slowed gait speed with fatigue, <0.4 m/s indicating high fall risk for household distance ambulation tasks. Pt making good progress toward functional mobility goals, needing up to minA for gait with single UE support (limited distance) and min guard using RW. Pt continues to benefit from PT services to progress toward functional mobility goals and with good activity tolerance.   Recommendations for follow up therapy are one component of a multi-disciplinary discharge planning process, led by the attending physician.  Recommendations may be updated based on patient status, additional functional criteria and insurance authorization.  Follow Up Recommendations  CIR     Equipment Recommendations  Rolling walker with 5" wheels (pending progress)    Recommendations for Other Services Rehab consult     Precautions / Restrictions Precautions Precautions: Fall Precaution Comments: R side ataxia Restrictions Weight Bearing Restrictions: No     Mobility  Bed Mobility Overal bed mobility: Needs Assistance Bed Mobility: Supine to Sit;Sit to Supine     Supine to sit: Supervision Sit to supine: Supervision   General bed mobility comments: cues to initiate    Transfers Overall transfer  level: Needs assistance Equipment used: Rolling walker (2 wheeled) Transfers: Sit to/from Stand Sit to Stand: Min assist         General transfer comment: cues for safe hand placement  Ambulation/Gait Ambulation/Gait assistance: +2 safety/equipment;Min guard;Min assist Gait Distance (Feet): 200 Feet (162ft x2 with standing break in gym) Assistive device: Rolling walker (2 wheeled);Straight cane Gait Pattern/deviations: Step-through pattern;Decreased stride length;Ataxic Gait velocity: decreased   General Gait Details: pt mostly min guard using RW, brief trial with cane and minA ~57ft with more ataxia noted with R LE when using cane and pt too fearful to perform >10 ft without RW. no overt LOB however pt c/o increased RLE tremors as distance increased   Stairs Stairs: Yes Stairs assistance: Min guard Stair Management: Two rails;Step to pattern;Forwards Number of Stairs: 4 General stair comments: cues for safe sequencing with poor carryover, pt does better with cueing for foot placement with each step, no buckling noted. pt very reliant on BUE support stepping up and down.   Wheelchair Mobility    Modified Rankin (Stroke Patients Only) Modified Rankin (Stroke Patients Only) Pre-Morbid Rankin Score: No symptoms Modified Rankin: Moderately severe disability     Balance Overall balance assessment: Needs assistance Sitting-balance support: No upper extremity supported Sitting balance-Leahy Scale: Fair     Standing balance support: Bilateral upper extremity supported Standing balance-Leahy Scale: Fair Standing balance comment: static standing unsupported and with U UE support no LOB, but needing minA for stepping with only U UE support                            Cognition Arousal/Alertness: Awake/alert Behavior During Therapy: Anxious;Flat affect Overall Cognitive Status: No family/caregiver present to determine baseline cognitive  functioning                                  General Comments: concrete thinking; increased time for problem solving; waits for instructions to perform tasks; apologizing throughout session; Poor executive functioning however unsure of baseline; perseverating on not having money to pay for "any of this"      Exercises      General Comments General comments (skin integrity, edema, etc.): HR WFL with exertion, no acute s/sx distress      Pertinent Vitals/Pain Pain Assessment: No/denies pain    Home Living                      Prior Function            PT Goals (current goals can now be found in the care plan section) Acute Rehab PT Goals Patient Stated Goal: to get better so he can work PT Goal Formulation: With patient Time For Goal Achievement: 01/24/21 Potential to Achieve Goals: Good Progress towards PT goals: Progressing toward goals    Frequency    Min 4X/week      PT Plan Current plan remains appropriate    Co-evaluation              AM-PAC PT "6 Clicks" Mobility   Outcome Measure  Help needed turning from your back to your side while in a flat bed without using bedrails?: A Little Help needed moving from lying on your back to sitting on the side of a flat bed without using bedrails?: A Little Help needed moving to and from a bed to a chair (including a wheelchair)?: A Little Help needed standing up from a chair using your arms (e.g., wheelchair or bedside chair)?: A Little Help needed to walk in hospital room?: A Little Help needed climbing 3-5 steps with a railing? : A Little 6 Click Score: 18    End of Session Equipment Utilized During Treatment: Gait belt Activity Tolerance: Patient tolerated treatment well Patient left: in chair;with call bell/phone within reach;in bed;with bed alarm set (bed in chair position) Nurse Communication: Mobility status PT Visit Diagnosis: Unsteadiness on feet (R26.81);Muscle weakness (generalized) (M62.81);Ataxic gait  (R26.0)     Time: 4034-7425 PT Time Calculation (min) (ACUTE ONLY): 32 min  Charges:  $Gait Training: 23-37 mins                     Joshua Styles P., PTA Acute Rehabilitation Services Pager: [phone] Office: [phone]    Joshua [name] 01/11/2021, 5:40 PM

## 2021-01-11 NOTE — Plan of Care (Signed)
  Problem: Education: Goal: Knowledge of disease or condition will improve Outcome: Progressing Goal: Knowledge of secondary prevention will improve Outcome: Progressing Goal: Knowledge of patient specific risk factors addressed and post discharge goals established will improve Outcome: Progressing Goal: Individualized Educational Video(s) Outcome: Progressing   Problem: Coping: Goal: Will verbalize positive feelings about self Outcome: Progressing Goal: Will identify appropriate support needs Outcome: Progressing   Problem: Health Behavior/Discharge Planning: Goal: Ability to manage health-related needs will improve Outcome: Progressing   Problem: Ischemic Stroke/TIA Tissue Perfusion: Goal: Complications of ischemic stroke/TIA will be minimized Outcome: Progressing

## 2021-01-11 NOTE — Progress Notes (Addendum)
Inpatient Rehabilitation Admissions Coordinator   Inpatient rehab consult received. I met at bedside with patient to discuss rehab goals and expectations . He is very emotional concerning his CVA and inability to work right now. He did provide me with his friend's number, Davy Pique, for me to call to discuss rehab and caregivers supports. I have left her a voicemail. I will begin insurance Auth with BCBS for a possible Cir admit.  Danne Baxter, RN, MSN Rehab Admissions Coordinator [phone] 01/11/2021 3:04 PM   I spoke with Sonya by phone and she can provide supervision after a CIR stay and they prefer CIR . I will follow up after I here from [facility].  Danne Baxter, RN, MSN Rehab Admissions Coordinator [phone] 01/11/2021 3:48 PM

## 2021-01-12 DIAGNOSIS — I10 Essential (primary) hypertension: Secondary | ICD-10-CM

## 2021-01-12 NOTE — PMR Pre-admission (Signed)
PMR Admission Coordinator Pre-Admission Assessment  Patient: Joshua [name] is an 60 y.o., male MRN: [id number] DOB: [birth year] Height:   Weight:    Insurance Information HMO:     PPO: yes     PCP:      IPA:      80/20:      OTHER:  PRIMARY: [place] [employer] of [place]      Policy#: [id number]      Subscriber: pt CM Name: Malachy Mood   Phone#: [phone]     Fax#: [phone] Pre-Cert#: [id number] approved until 11/3      Employer:  Benefits:  Phone #: [phone]     Name: 10/20 Eff. Date: 03/25/2020     Deduct: $1500      Out of Pocket Max: $5900      Life Max: none CIR: 70%      SNF: 70% 100 days per year Outpatient: $72 copay per visit     Co-Pay: visits per medical neccesity Home Health: 70%      Co-Pay: visits per medical neccesity DME: 70%     Co-Pay: 30% Providers: in network  SECONDARY: none  Financial Counselor:       Phone#:   [employer]" for patients in Inpatient Rehabilitation Facilities with attached "Privacy Act [place] Records" was provided and verbally reviewed with: N/A  Emergency Contact Information Contact Information     Name Relation Home Work Mobile   Salem Friend   [phone]      Current Medical History  Patient Admitting Diagnosis: CVA  History of Present Illness: 60 year old male with medical history of HTN, CAD CABG and CHF presented on 01/09/2021 via EMS with slurred speech and Bilateral leg weakness.   No TPA given for outside of window. CTA head and neck with b/l V3 and V4 occlusion, BA thrombus at SCA level with right SCA level with right SCA occlusion. S/p IR occluded bilateral VNJs and BA with TIC12c and right VBJ stenting. MRI confluent right cerebellar SCA territory infarct with numerous other small infarcts scattered throughout the bilateral posterior circulation. 2 d echo with EF 35 to 40%. LDL 123. Hgb A1c 5.3. Placed on ASA daily and Brilinta. For CHF on Coreg, lasix and spironolactone. HTN treated  with same. Off cleviprex.Continue Statin at discharge.  Complete NIHSS TOTAL: 1  Patient's medical record from [facility] has been reviewed by the rehabilitation admission coordinator and physician.  Past Medical History  Past Medical History:  Diagnosis Date   Acid reflux    Anasarca    Arthritis    SHOULDERS   CHF (congestive heart failure) (HCC)    Coronary artery disease 04/07/2013   cath by Dr. 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 Yes  Family History   family history includes Cancer in his mother; Coronary artery disease in his father; Heart attack in his paternal uncle; Hypertension in his father.  Current Medications  Current Facility-Administered Medications:    acetaminophen (TYLENOL) tablet 650 mg, 650 mg, Oral, Q4H PRN **OR** acetaminophen (TYLENOL)  160 MG/5ML solution 650 mg, 650 mg, Per Tube, Q4H PRN **OR** acetaminophen (TYLENOL) suppository 650 mg, 650 mg, Rectal, Q4H PRN, Deveshwar, Sanjeev, MD   aspirin chewable tablet 81 mg, 81 mg, Oral, Daily, 81 mg at 01/12/21 0948 **OR** [DISCONTINUED] aspirin chewable tablet 81 mg, 81 mg, Per Tube, Daily, Deveshwar, Sanjeev, MD   atorvastatin (LIPITOR) tablet 80 mg, 80 mg, Oral, Daily, Rosalin Hawking, MD, 80 mg at 01/12/21 0947   carvedilol (COREG) tablet 6.25 mg, 6.25 mg, Oral, BID WC, Rosalin Hawking, MD, 6.25 mg at 01/12/21 4818   Chlorhexidine Gluconate Cloth 2 % PADS 6 each, 6 each, Topical, Daily, Rosalin Hawking, MD, 6 each at 01/12/21 1138   enoxaparin (LOVENOX) injection 40 mg, 40 mg, Subcutaneous, Q24H, Rosalin Hawking, MD, 40  mg at 01/11/21 2133   furosemide (LASIX) tablet 40 mg, 40 mg, Oral, Daily, Rosalin Hawking, MD, 40 mg at 01/12/21 0948   labetalol (NORMODYNE) injection 5-20 mg, 5-20 mg, Intravenous, Q2H PRN, Rosalin Hawking, MD   spironolactone (ALDACTONE) tablet 25 mg, 25 mg, Oral, Daily, Rosalin Hawking, MD, 25 mg at 01/12/21 5631   ticagrelor (BRILINTA) tablet 90 mg, 90 mg, Oral, BID, 90 mg at 01/12/21 0946 **OR** [DISCONTINUED] ticagrelor (BRILINTA) tablet 90 mg, 90 mg, Per Tube, BID, Luanne Bras, MD  Patients Current Diet:  Diet Order             Diet Heart Room service appropriate? Yes with Assist; Fluid consistency: Thin  Diet effective now                  Precautions / Restrictions Precautions Precautions: Fall Precaution Comments: Rt sided ataxia Restrictions Weight Bearing Restrictions: No   Has the patient had 2 or more falls or a fall with injury in the past year? No  Prior Activity Level Community (5-7x/wk): Indpendent, driving and working  Prior Functional Level Self Care: Did the patient need help bathing, dressing, using the toilet or eating? Independent  Indoor Mobility: Did the patient need assistance with walking from room to room (with or without device)? Independent  Stairs: Did the patient need assistance with internal or external stairs (with or without device)? Independent  Functional Cognition: Did the patient need help planning regular tasks such as shopping or remembering to take medications? Independent  Patient Information Are you of Hispanic, Latino/a,or Spanish origin?: A. No, not of Hispanic, Latino/a, or Spanish origin What is your race?: A. White Do you need or want an interpreter to communicate with a doctor or health care staff?: 0. No  Patient's Response To:  Health Literacy and Transportation Is the patient able to respond to health literacy and transportation needs?: Yes Health Literacy - How often do you need to have someone help you when you read  instructions, pamphlets, or other written material from your doctor or pharmacy?: Never In the past 12 months, has lack of transportation kept you from medical appointments or from getting medications?: No In the past 12 months, has lack of transportation kept you from meetings, work, or from getting things needed for daily living?: No  Home Assistive Devices / Equipment Home Equipment: None  Prior Device Use: Indicate devices/aids used by the patient prior to current illness, exacerbation or injury? None of the above  Current Functional Level Cognition  Arousal/Alertness: Awake/alert Overall Cognitive Status: No family/caregiver present to determine baseline cognitive functioning Orientation Level: Oriented X4 General Comments: Emotional during session about not being able to work and afford medications, pleasant, "don't be mad  at me, you think I am stupid, asking me things i do not know." Poor understanding of incorporation of cognitive tasks during mobility despite multiple explanations. Apologizing throughout session. Able to recall 2/3 items for memory recall; naming things in categories well, counting down from 100 by 7s with some difficulty esp with walking. Attention: Sustained Sustained Attention: Impaired Sustained Attention Impairment: Verbal basic, Verbal complex, Functional basic Memory: Impaired Memory Impairment: Storage deficit, Retrieval deficit, Decreased recall of new information Awareness: Impaired Awareness Impairment: Intellectual impairment, Emergent impairment Problem Solving: Impaired Problem Solving Impairment: Functional basic Executive Function: Self Monitoring, Self Correcting, Organizing, Sequencing Sequencing: Impaired Sequencing Impairment: Functional basic Organizing: Impaired Organizing Impairment: Functional basic Self Monitoring: Impaired Self Monitoring Impairment: Functional basic, Verbal basic Self Correcting: Impaired Self Correcting Impairment:  Functional basic, Verbal basic Behaviors: Lability    Extremity Assessment (includes Sensation/Coordination)  Upper Extremity Assessment: Defer to OT evaluation RUE Deficits / Details: strength overall WFL however apparent ataxia; using functionally with minimal difficulty RUE Sensation: decreased light touch RUE Coordination: decreased fine motor, decreased gross motor  Lower Extremity Assessment: RLE deficits/detail RLE Deficits / Details: strength 5/5; ataxia noted with heel to shin on R RLE Sensation: decreased light touch, decreased proprioception RLE Coordination: decreased fine motor, decreased gross motor    ADLs  Overall ADL's : Needs assistance/impaired Eating/Feeding: Modified independent Grooming: Set up, Supervision/safety, Cueing for safety Grooming Details (indicate cue type and reason): Min A for steadying in standing Upper Body Bathing: Set up, Supervision/ safety, Sitting Lower Body Bathing: Minimal assistance, Sit to/from stand Upper Body Dressing : Supervision/safety, Set up, Sitting Lower Body Dressing: Minimal assistance Toilet Transfer: Minimal assistance, +2 for safety/equipment, Ambulation, RW, Grab bars Functional mobility during ADLs: Minimal assistance, Rolling walker, +2 for safety/equipment    Mobility  Overal bed mobility: Needs Assistance Bed Mobility: Supine to Sit, Sit to Supine Supine to sit: Supervision, HOB elevated Sit to supine: Supervision, HOB elevated General bed mobility comments: No assist needed, use of rails.    Transfers  Overall transfer level: Needs assistance Equipment used: Rolling walker (2 wheeled), None Transfers: Sit to/from Stand Sit to Stand: Min guard General transfer comment: Min guard for safety, cues for hand placement/technique as pt wanting to pull up onRw. Stood from [employer] for strengthening/functional mobility.    Ambulation / Gait / Stairs / Wheelchair Mobility  Ambulation/Gait Ambulation/Gait assistance: Min  guard, Herbalist (Feet): 200 Feet Assistive device: Rolling walker (2 wheeled) Gait Pattern/deviations: Step-through pattern, Decreased stride length, Ataxic General Gait Details: Slow, mildly unsteady and ataxic like gait esp through RLE; not able to dual task needing to stop and talk or problem solve. Declines using SPC, highly reliant on UEs for support during gait. Multiple standing rest breaks. Gait velocity: decreased Gait velocity interpretation: <1.31 ft/sec, indicative of household ambulator Stairs: Yes Stairs assistance: Min guard Stair Management: Two rails, Step to pattern, Forwards Number of Stairs: 4 General stair comments: cues for safe sequencing with poor carryover, pt does better with cueing for foot placement with each step, no buckling noted. pt very reliant on BUE support stepping up and down.    Posture / Balance Dynamic Sitting Balance Sitting balance - Comments: Able to reach down and adjust socks Balance Overall balance assessment: Needs assistance Sitting-balance support: Feet supported, No upper extremity supported Sitting balance-Leahy Scale: Good Sitting balance - Comments: Able to reach down and adjust socks Standing balance support: During functional activity Standing balance-Leahy Scale: Fair Standing balance comment: static  standing unsupported  but needing minA and/or UE support for walking    Special needs/care consideration Narda Rutherford NP gave him 9 sample bottles of Brilinta   Previous Home Environment  Living Arrangements: Alone  Lives With: Alone Available Help at Discharge: Friend(s), Available 24 hours/day Davy Pique and [employer] available 24/7) Type of Home: House Home Layout: One level Home Access: Stairs to enter [employer] of Steps: 1 Bathroom Shower/Tub: Optometrist: Yes How Accessible: Accessible via walker [place]: No  Discharge Living  Setting Plans for Discharge Living Setting: Lives with (comment) (To d/c to [place] and Todd's home) Type of Home at Discharge: House Discharge Home Layout: Two level, Able to live on main level with bedroom/bathroom, Full bath on main level (finished attic that [place] and her husband will move upstairs) Discharge Home Access: Stairs to enter Entrance Stairs-Rails: None Entrance Stairs-Number of Steps: 2 Discharge Bathroom Shower/Tub: Tub/shower unit Discharge Bathroom Toilet: Standard Discharge Bathroom Accessibility: Yes How Accessible: Accessible via walker Does the patient have any problems obtaining your medications?: Yes (Describe)  Sonya's address is [place] Patient Roles:  (employee) Contact Information: Davy Pique and her husband , Sherren Mocha, Friends Anticipated Caregiver: Sonya Anticipated Caregiver's Contact Information: see above Ability/Limitations of Caregiver: Todd works, Davy Pique is at home 24/7 Caregiver Availability: 24/7 Discharge Plan Discussed with Primary Caregiver: Yes Is Caregiver In Agreement with Plan?: Yes Does Caregiver/Family have Issues with Lodging/Transportation while Pt is in Rehab?: No  Goals Patient/Family Goal for Rehab: Mod I to intermittent supervision of PT, OT and SLP Expected length of stay: [facility] 7 to 10 days Additional Information: Patient very emotional and need reassurance Pt/Family Agrees to Admission and willing to participate: Yes Program Orientation Provided & Reviewed with Pt/Caregiver Including Roles  & Responsibilities: Yes  Decrease burden of Care through IP rehab admission: n/a  Possible need for SNF placement upon discharge: not anticipated  Patient Condition: I have reviewed medical records from [facility] , spoken with CM, and patient and family member. I met with patient at the bedside for inpatient rehabilitation assessment.  Patient will benefit from ongoing PT, OT, and SLP,  can actively participate in 3 hours of therapy a day 5 days of the week, and can make measurable gains during the admission.  Patient will also benefit from the coordinated team approach during an Inpatient Acute Rehabilitation admission.  The patient will receive intensive therapy as well as Rehabilitation physician, nursing, social worker, and care management interventions.  Due to bladder management, bowel management, safety, skin/wound care, disease management, medication administration, pain management, and patient education the patient requires 24 hour a day rehabilitation nursing.  The patient is currently min assist overall with mobility and basic ADLs.  Discharge setting and therapy post discharge at home with home health is anticipated.  Patient has agreed to participate in the Acute Inpatient Rehabilitation Program and will admit Saturday when bed is available.  Preadmission Screen Completed By:  Cleatrice Burke, 01/12/2021 12:55 PM ______________________________________________________________________   Discussed status with Dr. Ranell Patrick on 01/12/2021 at 1322 and received approval for admission Saturday when bed is available.  Admission Coordinator:  Cleatrice Burke, RN, time 5885 Date 01/12/2021   Assessment/Plan: Diagnosis: Basilar artery embolic stroke Does the need for close, 24 hr/day Medical supervision in concert with the patient's rehab needs make it unreasonable for this patient to be served in a less intensive setting? Yes Co-Morbidities requiring supervision/potential complications: kidney  stone, acid reflux, non-STEMI, essential hypertension, obesity, s/p CABG Due to bladder management, bowel management, safety, skin/wound care, disease management, medication administration, pain management, and patient education, does the patient require 24 hr/day rehab nursing? Yes Does the patient require coordinated care of a physician, rehab nurse, PT, OT to address physical  and functional deficits in the context of the above medical diagnosis(es)? Yes Addressing deficits in the following areas: balance, endurance, locomotion, strength, transferring, bowel/bladder control, bathing, dressing, feeding, grooming, toileting, and psychosocial support Can the patient actively participate in an intensive therapy program of at least 3 hrs of therapy 5 days a week? Yes The potential for patient to make measurable gains while on inpatient rehab is excellent Anticipated functional outcomes upon discharge from inpatient rehab: modified independent PT, modified independent OT, independent SLP Estimated rehab length of stay to reach the above functional goals is: 10-14 days Anticipated discharge destination: Home 10. Overall Rehab/Functional Prognosis: excellent   MD Signature: Leeroy Cha, MD

## 2021-01-12 NOTE — Plan of Care (Signed)

## 2021-01-12 NOTE — Progress Notes (Signed)
Physical Therapy Treatment Patient Details Name: Joshua [name] MRN: [id number] DOB: 25-Apr-1960 Today's Date: 01/12/2021   History of Present Illness 60 y/o male presented to [facility] ED on 10/18 with speech difficulty and gait instability/bilateral LE weakness. Transferred to [facility]. 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 S/p thrombectomy on 10/18. PMH: CAD, HTN, CHF, NSTEMI.    PT Comments    Patient progressing well with mobility. This session focused on incorporating cognitive tasks with mobility. Pt with difficulty dual tasking when solving problems, counting down from 100 by 7s and naming things in certain categories during gait needing to stop and answer. Continues to have some ataxia through RLE and does not feel safe ambulating without UE support relying heavily on RW. Pt emotional during session apologizing multiple times and has a hard time understanding reason for certain tasks being asked of him despite explanation. Performed 5xSTS in 15.7 seconds (normative for this age is 11.4 seconds) indicating decreased functional strength, impaired balance and fall risk. Continues to be highly motivated and a great CIR candidate. Will follow.   Recommendations for follow up therapy are one component of a multi-disciplinary discharge planning process, led by the attending physician.  Recommendations may be updated based on patient status, additional functional criteria and insurance authorization.  Follow Up Recommendations  CIR     Equipment Recommendations  Rolling walker with 5" wheels    Recommendations for Other Services       Precautions / Restrictions Precautions Precautions: Fall Precaution Comments: Rt sided ataxia Restrictions Weight Bearing Restrictions: No     Mobility  Bed Mobility Overal bed mobility: Needs Assistance Bed Mobility: Supine to Sit;Sit to Supine     Supine to  sit: Supervision;HOB elevated Sit to supine: Supervision;HOB elevated   General bed mobility comments: No assist needed, use of rails.    Transfers Overall transfer level: Needs assistance Equipment used: Rolling walker (2 wheeled);None Transfers: Sit to/from Stand Sit to Stand: Min guard         General transfer comment: Min guard for safety, cues for hand placement/technique as pt wanting to pull up onRw. Stood from [employer] for strengthening/functional mobility.  Ambulation/Gait Ambulation/Gait assistance: Min guard;Min assist Gait Distance (Feet): 200 Feet Assistive device: Rolling walker (2 wheeled) Gait Pattern/deviations: Step-through pattern;Decreased stride length;Ataxic Gait velocity: decreased Gait velocity interpretation: <1.31 ft/sec, indicative of household ambulator General Gait Details: Slow, mildly unsteady and ataxic like gait esp through RLE; not able to dual task needing to stop and talk or problem solve. Declines using SPC, highly reliant on UEs for support during gait. Multiple standing rest breaks.   Stairs             Wheelchair Mobility    Modified Rankin (Stroke Patients Only) Modified Rankin (Stroke Patients Only) Pre-Morbid Rankin Score: No symptoms Modified Rankin: Moderately severe disability     Balance Overall balance assessment: Needs assistance Sitting-balance support: Feet supported;No upper extremity supported Sitting balance-Leahy Scale: Good Sitting balance - Comments: Able to reach down and adjust socks   Standing balance support: During functional activity Standing balance-Leahy Scale: Fair Standing balance comment: static standing unsupported  but needing minA and/or UE support for walking                            Cognition Arousal/Alertness: Awake/alert Behavior During Therapy: Anxious Overall Cognitive Status: No family/caregiver present to determine baseline  cognitive functioning                                  General Comments: Emotional during session about not being able to work and afford medications, pleasant, "don't be mad at me, you think I am stupid, asking me things i do not know." Poor understanding of incorporation of cognitive tasks during mobility despite multiple explanations. Apologizing throughout session. Able to recall 2/3 items for memory recall; naming things in categories well, counting down from 100 by 7s with some difficulty esp with walking.      Exercises      General Comments General comments (skin integrity, edema, etc.): Performed 5xSTS in 15.7 seconds (normative for this age is 11.4 seconds) indicating decreased functional strength, impaired balance and fall risk.      Pertinent Vitals/Pain Pain Assessment: No/denies pain    Home Living                      Prior Function            PT Goals (current goals can now be found in the care plan section) Progress towards PT goals: Progressing toward goals    Frequency    Min 4X/week      PT Plan Current plan remains appropriate    Co-evaluation              AM-PAC PT "6 Clicks" Mobility   Outcome Measure  Help needed turning from your back to your side while in a flat bed without using bedrails?: A Little Help needed moving from lying on your back to sitting on the side of a flat bed without using bedrails?: A Little Help needed moving to and from a bed to a chair (including a wheelchair)?: A Little Help needed standing up from a chair using your arms (e.g., wheelchair or bedside chair)?: A Little Help needed to walk in hospital room?: A Little Help needed climbing 3-5 steps with a railing? : A Little 6 Click Score: 18    End of Session Equipment Utilized During Treatment: Gait belt Activity Tolerance: Patient tolerated treatment well Patient left: in bed;with call bell/phone within reach;with bed alarm set Nurse Communication: Mobility status PT Visit  Diagnosis: Unsteadiness on feet (R26.81);Muscle weakness (generalized) (M62.81);Ataxic gait (R26.0)     Time: 2229-7989 PT Time Calculation (min) (ACUTE ONLY): 32 min  Charges:  $Gait Training: 8-22 mins $Therapeutic Activity: 8-22 mins                     Vale Haven, PT, DPT Acute Rehabilitation Services Pager [phone] Office [phone]      Blake Divine A Lanier Ensign 01/12/2021, 10:48 AM

## 2021-01-12 NOTE — TOC Initial Note (Signed)
Transition of Care [facility]) - Initial/Assessment Note    Patient Details  Name: Joshua [name] MRN: [id number] Date of Birth: Jul 17, 1960  [facility] CM/SW Contact:    Kermit Balo, RN Phone Number: 01/12/2021, 11:12 AM  Clinical Narrative:                 Patient is from home alone. Recommendations are for CIR and they have submitted for insurance authorization.  TOC consulted for financial assist as pt will not be able to work for a period of time. CM has provided him resources through [employer] to assist with his bills. Patient also states he has some sick/ vacation time he can use also.  Pt has friends that he will stay with at d/c and another friend that is going to assist him in looking into disability.  TOC following.  Expected Discharge Plan: IP Rehab Facility Barriers to Discharge: Continued Medical Work up   Patient Goals and CMS Choice   CMS Medicare.gov Compare Post Acute Care list provided to:: Patient Choice offered to / list presented to : Patient  Expected Discharge Plan and Services Expected Discharge Plan: IP Rehab Facility   Discharge Planning Services: CM Consult Post Acute Care Choice: IP Rehab Living arrangements for the past 2 months: Single Family Home                                      Prior Living Arrangements/Services Living arrangements for the past 2 months: Single Family Home Lives with:: Self Patient language and need for interpreter reviewed:: Yes Do you feel safe going back to the place where you live?: Yes      Need for Family Participation in Patient Care: Yes (Comment) Care giver support system in place?: Yes (comment)   Criminal Activity/Legal Involvement Pertinent to Current Situation/Hospitalization: No - Comment as needed  Activities of Daily Living      Permission Sought/Granted                  Emotional Assessment Appearance:: Appears stated age Attitude/Demeanor/Rapport: Engaged Affect  (typically observed): Accepting Orientation: : Oriented to Self, Oriented to Place, Oriented to  Time, Oriented to Situation   Psych Involvement: No (comment)  Admission diagnosis:  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] Patient Active Problem List   Diagnosis Date Noted   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   Basilar artery occlusion 01/09/2021   Chronic combined systolic and diastolic heart failure (HCC) 08/05/2016   Acute respiratory failure (HCC) 07/21/2016   Hypertensive emergency 07/21/2016   Acute kidney injury (HCC) 07/21/2016   Anasarca    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   Acid reflux 04/07/2013   Obesity, unspecified 04/07/2013   Coronary artery disease 04/07/2013   Kidney stone    Non-STEMI (non-ST elevated myocardial infarction) (HCC) 04/06/2013   Essential hypertension, benign 04/06/2013   PCP:  Tommie Sams, DO Pharmacy:   [facility] [phone] [place], [place] - [address] AT [facility] OF [address] [place] [address] [place] [address] Phone: [phone] Fax: [phone]     Social Determinants of Health (SDOH) Interventions    Readmission Risk Interventions No flowsheet data found.

## 2021-01-12 NOTE — Progress Notes (Signed)
STROKE TEAM PROGRESS NOTE   SUBJECTIVE (INTERVAL HISTORY) No family is at the bedside. He is lying in bed, awake alert, neuro stable, no acute event overnight.  CIR planned on Sunday. Pt stated that he is able to walk well, but still not able to write well with his right hand.   OBJECTIVE Temp:  [97.7 F (36.5 C)-98.6 F (37 C)] 98.2 F (36.8 C) (10/21 1321) Pulse Rate:  [66-80] 76 (10/21 1321) Cardiac Rhythm: Normal sinus rhythm;Bundle branch block (10/21 0842) Resp:  [16-19] 19 (10/21 1321) BP: (115-164)/(72-106) 132/92 (10/21 1321) SpO2:  [95 %-99 %] 95 % (10/21 1321)  Recent Labs  Lab 01/09/21 0217  GLUCAP 141*   Recent Labs  Lab 01/09/21 0240 01/10/21 0449  NA 137 136  K 4.2 3.8  CL 101 102  CO2 25 21*  GLUCOSE 154* 104*  BUN 22* 13  CREATININE 1.02 1.08  CALCIUM 9.4 8.8*   Recent Labs  Lab 01/09/21 0240  AST 20  ALT 20  ALKPHOS 54  BILITOT 1.3*  PROT 8.3*  ALBUMIN 4.9   Recent Labs  Lab 01/09/21 0240 01/10/21 0449  WBC 11.2* 8.7  NEUTROABS 9.8* 6.5  HGB 18.1* 16.2  HCT 53.0* 47.4  MCV 89.7 89.1  PLT 202 196   No results for input(s): CKTOTAL, CKMB, CKMBINDEX, TROPONINI in the last 168 hours. No results for input(s): LABPROT, INR in the last 72 hours.  No results for input(s): COLORURINE, LABSPEC, PHURINE, GLUCOSEU, HGBUR, BILIRUBINUR, KETONESUR, PROTEINUR, UROBILINOGEN, NITRITE, LEUKOCYTESUR in the last 72 hours.  Invalid input(s): APPERANCEUR      Component Value Date/Time   CHOL 194 01/10/2021 0449   TRIG 205 (H) 01/10/2021 0449   HDL 30 (L) 01/10/2021 0449   CHOLHDL 6.5 01/10/2021 0449   VLDL 41 (H) 01/10/2021 0449   LDLCALC 123 (H) 01/10/2021 0449   Lab Results  Component Value Date   HGBA1C 5.3 01/10/2021      Component Value Date/Time   LABOPIA NONE DETECTED 01/09/2021 0515   COCAINSCRNUR NONE DETECTED 01/09/2021 0515   LABBENZ NONE DETECTED 01/09/2021 0515   AMPHETMU NONE DETECTED 01/09/2021 0515   THCU NONE DETECTED  01/09/2021 0515   LABBARB NONE DETECTED 01/09/2021 0515    Recent Labs  Lab 01/09/21 0240  ETH <10    I have personally reviewed the radiological images below and agree with the radiology interpretations.  CT HEAD WO CONTRAST  Result Date: 01/09/2021 CLINICAL DATA:  Stroke follow-up. Status post basilar artery thrombectomy. EXAM: CT HEAD WITHOUT CONTRAST TECHNIQUE: Contiguous axial images were obtained from the base of the skull through the vertex without intravenous contrast. COMPARISON:  Head CT earlier today FINDINGS: Brain: There is new mild asymmetric hypodensity in the right cerebellar hemisphere suspicious for an acute infarct. No acute supratentorial infarct, intracranial hemorrhage, midline shift, or extra-axial fluid collection is identified. Hypodensities in the cerebral white matter bilaterally are unchanged and nonspecific but compatible with mild chronic small vessel ischemic disease. The ventricles and sulci are normal. Vascular: Calcified atherosclerosis at the skull base. New stent in the distal right vertebral artery. Residual intravascular contrast material. Skull: No fracture or suspicious osseous lesion. Sinuses/Orbits: Mild mucosal thickening in the paranasal sinuses. Clear mastoid air cells. Unremarkable orbits. Other: None. IMPRESSION: 1. Suspected acute right cerebellar infarct. 2. No intracranial hemorrhage. Electronically Signed   By: Sebastian Ache M.D.   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 Status post endovascular revascularization of basilar artery and right vertebrobasilar junction. Stented right vertebrobasilar junction. Right cerebellar infarcts suspected on postprocedure CT. EXAM: MRA HEAD WITHOUT CONTRAST TECHNIQUE: Angiographic images of the Circle of Willis were acquired using  MRA technique without intravenous contrast. COMPARISON:  MRI this morning reported separately. CTA head and neck yesterday. FINDINGS: Susceptibility artifact in the visible distal right vertebral artery, with preserved flow signal in the distal aspect of the stent. Preserved flow signal at the bilateral vertebrobasilar junction, however, that on the left might be retrograde. Patent basilar artery with mild irregularity, no stenosis. Bilateral AICA, SCA, and PCA origins are patent. Posterior communicating arteries are diminutive or absent. Bilateral PCA branches are patent, mildly irregular in the left P2 segment. Antegrade flow in both ICA siphons. Mild siphon irregularity, no significant siphon stenosis. Patent carotid termini, MCA and ACA origins. Mildly dominant A1 as seen yesterday. Anterior communicating artery, visible ACA branches, right MCA M1 and visible right MCA branches are within normal limits. There is a mild left MCA mid M1 segment irregularity and stenosis which is more apparent than on the CTA yesterday (series 1007, image 10). But the left MCA bifurcation is patent without stenosis. Visible left MCA branches are within normal limits. IMPRESSION: 1. Patent Basilar Artery without stenosis. Patent bilateral AICA, SCA, and PCA origins. 2. Patent vertebrobasilar junction, but the distal vertebral arteries are minimally included on this exam and there is stent artifact on the right. 3. Anterior circulation remarkable for mild bilateral ICA siphon and Left MCA M1 atherosclerosis. Electronically Signed   By: Odessa Fleming M.D.   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 Status post endovascular revascularization of basilar artery and right vertebrobasilar junction. Stented right vertebrobasilar junction. Right cerebellar infarcts  suspected on postprocedure CT. EXAM: MRI HEAD WITHOUT CONTRAST TECHNIQUE: Multiplanar, multiecho pulse sequences of the brain and surrounding structures were obtained without intravenous contrast. COMPARISON:  Head CT 14 10 and 0226 hours yesterday. FINDINGS: Brain: Confluent right superior cerebellar artery territory infarct with restricted diffusion (series 7, image 59). But numerous scattered small additional bilateral cerebellar hemisphere infarcts. Restricted diffusion at the right lateral medulla also on series 5, image 66. And there are several small foci of diffusion restriction also in the right midbrain near the midline (series 5, image 77). Several punctate infarcts in the right thalamus including ventrally on series 5, image 84. Similar scattered small infarcts in the bilateral occipital lobes and occasionally the right parietal lobe. And there are perhaps 1 or 2 punctate infarcts also in the anterior circulation (left middle frontal gyrus series 5, image 101). No evidence of hemorrhagic transformation. Cytotoxic edema at the affected areas, most pronounced in the right superior cerebellum. Mild mass effect on the 4th ventricle, but no ventriculomegaly or other significant intracranial mass effect. Superimposed patchy and scattered chronic cerebral white matter T2 and FLAIR hyperintensity, most pronounced in the periatrial regions. Deep gray matter nuclei are within normal limits. There is a chronic microhemorrhage in the anterior right frontal lobe white matter series 14, image 32. No evidence of mass lesion, extra-axial collection or acute intracranial hemorrhage. Cervicomedullary junction and pituitary are within normal limits. Vascular: Evidence of continued absent flow in the distal left vertebral artery. But other  major intracranial vascular flow voids are preserved, see also MRA today reported separately. Skull and upper cervical spine: Negative visible cervical spine. Visualized bone marrow  signal is within normal limits. Sinuses/Orbits: Negative. Other: Trace mastoid air cell fluid on the right. Visible internal auditory structures appear normal. Negative visible scalp and face. IMPRESSION: 1. Confluent right cerebellar SCA territory infarct with numerous other small infarcts scattered throughout the bilateral posterior circulation, including: Right lateral medulla, bilateral cerebellar hemispheres, right midbrain, right thalamus, bilateral occipital lobes. Occasional punctate anterior circulation infarcts. 2. No associated hemorrhage. Mild mass effect on the 4th ventricle but no ventriculomegaly. No midline shift and normal basilar cisterns. 3. Moderate underlying chronic cerebral white matter disease. Solitary chronic microhemorrhage in the right frontal lobe white matter. 4. MRA today reported separately. Electronically Signed   By: Odessa Fleming M.D.   On: 01/10/2021 05:06   IR US Guide Vasc Access Right  Result Date: 01/11/2021 INDICATION: Speech difficulty, gait instability and bilateral lower extremity weakness. 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 EXAM: 1. EMERGENT LARGE VESSEL OCCLUSION THROMBOLYSIS (POSTERIOR CIRCULATION) COMPARISON:  CT angiogram of the head and neck 01/09/2021. MEDICATIONS: Ancef 2 g IV antibiotic was administered within 1 hour of the procedure. ANESTHESIA/SEDATION: General anesthesia CONTRAST:  Omnipaque 300 approximately 75 cc. FLUOROSCOPY TIME:  Fluoroscopy Time: 92 minutes 40 seconds (7422 mGy). COMPLICATIONS: None immediate. TECHNIQUE: Patient's neurological status and imaging findings were reviewed and discussed with Dr. Thomasena Edis, on-call neuro hospitalist. 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 Reasons were then made for patient's transfer to [facility] for endovascular treatment of the occluded basilar artery. Informed  witness consent was not obtainable as the patient was intubated and under general anesthesia. No family members or next of kin were available via telephone or physically. Given the neuroimaging findings, and patient's clinical condition, an emergent consent was initiated. The patient was then put under general anesthesia by the Department of Anesthesiology at [facility]. The right groin was prepped and draped in the usual sterile fashion. Thereafter using modified Seldinger technique, transfemoral access into the right common femoral artery was obtained without difficulty. Over a 0.035 inch guidewire a 5 [place] Pinnacle 25 cm sheath was inserted. Through this, and also over a 0.035 inch guidewire a 5 [place] JB 1 catheter was advanced to the aortic arch region and selectively positioned in the left common carotid artery, the left vertebral artery, the right common carotid artery and the right vertebral artery. FINDINGS: Non dominant left vertebral artery demonstrates mild stenosis at its origin. The vessel demonstrates slow ascent of contrast to the cranial skull base to complete occlusion at the level of C2. Partial opacification is noted of the left vertebrobasilar junction just distal to the origin of the left posterior-inferior cerebellar artery, the posterior spinal artery. Incomplete opacification is seen of the left posterior-inferior cerebellar artery. 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 On the AP projection there is central prominent vessel which projects superiorly and in the midline opacifying the confluence of the vertebrobasilar junctions and retrogradely the right vertebrobasilar junction with partial opacification of the proximal right posteroinferior cerebellar artery. No clearance of contrast is noted distally. The left common carotid arteriogram demonstrates patency of the left external carotid  artery and its major branches. The left internal carotid artery at the bulb to the  cranial skull base is widely patent. The petrous, the cavernous and the supraclinoid segments are widely patent. The left middle cerebral artery demonstrates a 50% stenosis in the mid M1 segment. More distally the trifurcation branches are widely patent. The left anterior cerebral artery opacifies into the capillary and venous phases. The innominate arteriogram demonstrates patency of the proximal right subclavian artery with moderate tortuosity. The origin of the right vertebral artery is noted with ascent of contrast to the cranial skull base. Right common carotid arteriogram demonstrates the right external carotid artery and its major branches to be widely patent. The right internal carotid artery at the bulb to the cranial skull base is widely patent. The intracranial portion was not examined. PROCEDURE: The 5 French diagnostic catheter was then advanced over a 0.035 inch Roadrunner guidewire to the distal right subclavian artery and exchanged over a stiff Glidewire for a 95 cm Neuron Max sheath inside of which was a 115 cm 5 [place] Catalyst guide catheter. The combination was then advanced and positioned just distal to the origin of the right vertebral artery. The exchange wire was removed. Combination of the 5 [place] Catalyst guide catheter and the Neuron Max sheath was then retrieved proximal to the origin of the right vertebral artery. Multiple attempts were then made using 035 inch guidewires, 016 micro guidewire with 021 microcatheter, and 014 inch micro guidewire with the 021 micro catheters without success. A radial access was then obtained using ultrasound guidance. Initially the morphology of the right radial artery was identified with ultrasound and documented. A dorsal palmar anastomosis was verified to be present. Over a 0.018 inch micro guidewire and a micropuncture set, access was obtained with ultrasound guidance  with placement of a 6/7 French radial sheath. The micro guidewire and the obturator were removed. Good aspiration obtained from the side port of the radial sheath. A cocktail of 2000 units of heparin, and 2.5 mg of verapamil, and 200 mcg of nitroglycerin was then infused without event. Over a 0.035 inch Roadrunner guidewire, a 5 [place] JB 1 catheter, JB 2 catheter and a JB 3 catheter were advanced and multiple attempts were made to cannulate the right vertebral artery without success. This approach was then abandoned. Another attempt was then made through the right common femoral artery puncture site. A 5 [place] JB 3 diagnostic catheter was then advanced over a 0.035 inch glidewire to the distal right subclavian artery. The guidewire was then gently retrieved proximally with the microcatheter retrieved until it engaged the origin of the right vertebral artery. A 0.035 inch Roadrunner guidewire was then advanced more distally into the right vertebral artery followed by the 5 [place] JB 3 catheter. Good aspiration obtained from the hub of the diagnostic catheter following removal of the guidewire. A control arteriogram performed through the diagnostic catheter demonstrated slow ascent contrast to the vertebrobasilar junction with complete occlusion of the dominant right vertebral artery at the level of C1. Over a 300 cm exchange 035 inch guidewire, combination of the Catalyst 115 cm guide catheter inside of an 8 [place] Neuron Max sheath was advanced to the distal right vertebral artery. The guidewire was removed. Good aspiration obtained from the hub of the 5 [place] Catalyst guide catheter. A gentle contrast injection continued to demonstrate occluded dominant right vertebral artery at the level of C1. At this time a combination of an 021 162 cm microcatheter and 014 inch Synchro standard micro guidewire with a moderate J configuration was advanced to the distal end of  the 5 [place] Catalyst guide catheter. Using a  torque device, gentle advancement was performed of the micro guidewire followed by the microcatheter to the site of the occluded dominant right vertebrobasilar junction proximal to the dominant right posteroinferior cerebellar artery and distally just proximal to the basilar artery. Eventually access with the micro guidewire was obtained followed by the microcatheter. The micro guidewire was then advanced to the P2 region of the left posterior cerebral artery followed by the microcatheter. The guidewire was removed. Good aspiration obtained from the hub of the catheter. Gentle control arteriogram demonstrated safe position of the tip of the microcatheter. This was then connected to continuous heparinized saline infusion. A 4 mm x 40 mm Solitaire X retrieval device was then advanced to the distal end of the microcatheter. The O ring on the delivery microcatheter was loosened and the delivery microcatheter was retrieved deploying the retrieval device. The 5 [place] Catalyst guide catheter was now advanced to the occluded segment of the dominant right vertebral artery proximal to the basilar artery. Thereafter constant aspiration was applied with a Penumbra aspiration device at the hub of the Catalyst guide catheter and with a 20 mL syringe at the hub of the Neuron Max sheath for approximately 3 minutes. The combination of the retrieval device, the microcatheter, and the 5 [place] Catalyst guide catheter was retrieved and removed. Following removal, there was free aspiration of blood at the hub of the Neuron Max sheath at the level of the skull base. A control arteriogram performed now demonstrated revascularization of the right vertebrobasilar junction,the basilar artery, the posterior cerebral arteries, the superior cerebellar arteries and the left anterior inferior cerebellar artery. Also there was opacification of the right posterior-inferior cerebellar artery and retrograde flow into the left vertebrobasilar  junction. Moderate amount of thrombus was noted entangled in the struts of the retrieval device. Moderate spasm was noted in the mid basilar artery,the proximal basilar artery and the right vertebrobasilar junction. This responded to 2 aliquots of nitroglycerin intra-arterially. Severe intracranial stenosis was noted at two sites at the right vertebrobasilar junction, one distal and one proximal to the right posterior-inferior cerebellar artery. Measurements were then performed of the distal right vertebrobasilar junction and the proximal right vertebrobasilar junction. It was decided to proceed with placement of a 3 mm x 22 mm Resolute Onyx stent. Over a 0.014 inch standard Synchro micro guidewire, an 021 160 cm microcatheter was advanced coaxially with a 5 [place] Catalyst guide catheter to the distal end of the Neuron Max sheath in the right vertebral artery. Two areas of high-grade stenosis were then crossed with the micro guidewire followed by the microcatheter which was advanced to the distal basilar artery. The micro guidewire was removed. Good aspiration obtained from the hub of the microcatheter. This in turn was replaced with a 300 cm 014 inch Zoom exchange micro guidewire. The distal end of the Zoom guidewire had a mild J configuration. The 3 mm x 22 mm Resolute Onyx balloon mounted stent was then retrogradely purged with heparinized saline infusion, and antegradely with 50% contrast and 50% heparinized saline infusion. Using the rapid exchange technique, the balloon mountable stent delivery apparatus was advanced without difficulty and positioned adequate distance from the distal stenosis and proximal stenosis. This was then deployed with a micro inflation syringe device via micro tubing to 12 atmospheres achieving a 3.05 mm diameter. The balloon was deflated and retrieved and removed. A control arteriogram performed through the Catalyst guide catheter demonstrated significantly improved caliber and  flow  through the stented segment at the site of the proximal stenosis. Suboptimal coverage was noted of the distal stenosis. This necessitated the placement of a second Resolute Onyx stent 3 mm x 15 mm. Having been prepped as described above, the stent apparatus was advanced and positioned such that the distal portion of the second stent was just proximal to the basilar artery. Thereafter, this was then deployed in the usual manner using micro inflation syringe device via micro tubing to over 3 mm at approximately 11 atmospheres. The balloon was deflated and retrieved and removed. A control arteriogram performed through the Catalyst guide catheter in the proximal right vertebrobasilar junction now demonstrated excellent flow through the right vertebral artery, the right vertebrobasilar junction, the posterior cerebral arteries, the superior cerebellar arteries and the left anterior inferior cerebellar artery. No significant flow was noted in the right anterior inferior cerebellar artery. There was retrograde flow into the left vertebrobasilar junction. Control arteriograms were then performed at approximally 10 and 25 minutes after placement of a second stent. These continued to demonstrate excellent flow without evidence of intraluminal filling defects. A TICI 2C revascularization of the vertebrobasilar system was noted as the right anterior inferior cerebellar artery was now angiographically verified. The 5 [place] Catalyst guide catheter was removed. The Neuron Max sheath was then retrieved into the proximal right vertebral artery. A final control arteriogram performed through this demonstrated wide patency of the right vertebral artery intracranially and extracranially with no change in the revascularized posterior fossa. The 8 French Pinnacle sheath was removed. Manual compression was applied for hemostasis in the right groin puncture site. Distal pulses remained Dopplerable in both feet unchanged. A CT scan of the  brain obtained prior to the placement of the intracranial stents, and after continued to demonstrate no evidence of mass effect, midline shift or of intracranial hemorrhage. Also, the patient was loaded with 180 mg of Brilinta orally via an OG tube prior to placement of the intracranial stents. Also right prior to placement of the stent, the patient was given a half loading dose of Cangrelor IV followed by a 4 hour infusion. CT of the brain was to be obtained at 4 hours of the Cangrelor infusion. The patient's general anesthesia was reversed. Patient was extubated. Upon recovery, the patient was able to obey simple commands appropriately. Denied any headaches or nausea. The patient was able to move all four extremities. His range of movements was within normal limits. He was then transferred to the neuro ICU for post revascularization management. 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 PLAN: Follow-up in clinic 2 weeks post discharge. Electronically Signed   By: Julieanne Cotton M.D.   On: 01/11/2021 08:35   DG Chest Port 1 View  Result Date: 01/09/2021 CLINICAL DATA:  Increased weakness and slurred speech EXAM: PORTABLE CHEST 1 VIEW COMPARISON:  07/21/2016 FINDINGS: Cardiac shadow is enlarged. Postsurgical changes are again noted and stable. Lungs are clear bilaterally. No bony abnormality is seen. IMPRESSION: Stable cardiomegaly.  No acute abnormality noted. Electronically Signed   By: Alcide Clever M.D.   On: 01/09/2021 02:57   ECHOCARDIOGRAM COMPLETE  Result Date: 01/10/2021    ECHOCARDIOGRAM REPORT  Patient Name:    Joshua [name] Date of Exam: 01/10/2021 Medical Rec #:  [id number]   Height:       72.0 in Accession #:    [id number]  Weight:       263.0 lb Date of Birth:  May 08, 1960   BSA:          2.394 m Patient Age:    60 years    BP:           129/97 mmHg Patient Gender: M           HR:           78 bpm. Exam Location:  Inpatient Procedure: 2D Echo, Cardiac Doppler and Color Doppler Indications:     Stroke  History:         Patient has prior history of Echocardiogram examinations, most                  recent 07/21/2016. Risk Factors:Hypertension, Family History of                  Coronary Artery Disease and Former Smoker.  Sonographer:     Alvera Novel Referring Phys:  [id number] GLOVFIE Calais Svehla Diagnosing Phys: Thomasene Ripple DO IMPRESSIONS  1. Left ventricular ejection fraction, by estimation, is 35 to 40%. The left ventricle has moderately decreased function. The left ventricle demonstrates global hypokinesis. There is severe concentric left ventricular hypertrophy. Left ventricular diastolic parameters are consistent with Grade I diastolic dysfunction (impaired relaxation).  2. Right ventricular systolic function is normal. The right ventricular size is normal.  3. The mitral valve is normal in structure. Trivial mitral valve regurgitation. No evidence of mitral stenosis.  4. The aortic valve is normal in structure. Aortic valve regurgitation is not visualized. Mild to moderate aortic valve sclerosis/calcification is present, without any evidence of aortic stenosis.  5. Aortic annular calcification. Aortic dilatation noted.  6. The inferior vena cava is normal in size with greater than 50% respiratory variability, suggesting right atrial pressure of 3 mmHg. FINDINGS  Left Ventricle: Left ventricular ejection fraction, by estimation, is 35 to 40%. The left ventricle has moderately decreased function. The left ventricle demonstrates global hypokinesis. Definity contrast agent was given IV to delineate the left ventricular endocardial  borders. The left ventricular internal cavity size was normal in size. There is severe concentric left ventricular hypertrophy. Left ventricular diastolic parameters are consistent with Grade I diastolic dysfunction (impaired relaxation). Right Ventricle: The right ventricular size is normal. No increase in right ventricular wall thickness. Right ventricular systolic function is normal. Left Atrium: Left atrial size was normal in size. Right Atrium: Right atrial size was normal in size. Pericardium: There is no evidence of pericardial effusion. Presence of pericardial fat pad. Mitral Valve: The mitral valve is normal in structure. Mild mitral annular calcification. Trivial mitral valve regurgitation. No evidence of mitral valve stenosis. Tricuspid Valve: The tricuspid valve is normal in structure. Tricuspid valve regurgitation is not demonstrated. No evidence of tricuspid stenosis. Aortic Valve: The aortic valve is normal in structure. Aortic valve regurgitation is not visualized. Mild to moderate aortic valve sclerosis/calcification is present, without any evidence of aortic stenosis. Pulmonic Valve: The pulmonic valve was not well visualized. Pulmonic valve regurgitation is not visualized. No evidence of pulmonic stenosis. Aorta: Aortic annular calcification. Aortic dilatation noted and the aortic root is normal in size and structure. Venous: The inferior vena cava is normal in size with greater than 50% respiratory variability, suggesting right  atrial pressure of 3 mmHg. IAS/Shunts: No atrial level shunt detected by color flow Doppler.  LEFT VENTRICLE PLAX 2D LVIDd:         5.35 cm      Diastology LVIDs:         4.60 cm      LV e' medial:    4.13 cm/s LV PW:         1.60 cm      LV E/e' medial:  16.8 LV IVS:        1.60 cm      LV e' lateral:   6.31 cm/s LVOT diam:     2.20 cm      LV E/e' lateral: 11.0 LV SV:         68 LV SV Index:   29 LVOT Area:     3.80 cm  LV Volumes (MOD) LV vol d, MOD A2C: 164.0 ml LV  vol d, MOD A4C: 195.0 ml LV vol s, MOD A2C: 98.6 ml LV vol s, MOD A4C: 128.0 ml LV SV MOD A2C:     65.4 ml LV SV MOD A4C:     195.0 ml LV SV MOD BP:      70.4 ml RIGHT VENTRICLE             IVC RV S prime:     15.30 cm/s  IVC diam: 1.60 cm TAPSE (M-mode): 1.9 cm LEFT ATRIUM             Index        RIGHT ATRIUM           Index LA diam:        2.70 cm 1.13 cm/m   RA Area:     19.40 cm LA Vol (A2C):   35.4 ml 14.79 ml/m  RA Volume:   44.80 ml  18.72 ml/m LA Vol (A4C):   38.3 ml 16.00 ml/m LA Biplane Vol: 37.7 ml 15.75 ml/m  AORTIC VALVE LVOT Vmax:   114.00 cm/s LVOT Vmean:  70.600 cm/s LVOT VTI:    0.180 m  AORTA Ao Root diam: 3.70 cm Ao Asc diam:  3.40 cm MITRAL VALVE               TRICUSPID VALVE MV Area (PHT): 3.27 cm    TR Peak grad:   8.9 mmHg MV Decel Time: 232 msec    TR Vmax:        149.00 cm/s MV E velocity: 69.50 cm/s MV A velocity: 77.10 cm/s  SHUNTS MV E/A ratio:  0.90        Systemic VTI:  0.18 m                            Systemic Diam: 2.20 cm Kardie Tobb DO Electronically signed by Thomasene Ripple DO Signature Date/Time: 01/10/2021/12:47:06 PM    Final    IR PERCUTANEOUS ART THROMBECTOMY/INFUSION INTRACRANIAL INC DIAG ANGIO  Result Date: 01/11/2021 INDICATION: Speech difficulty, gait instability and bilateral lower extremity weakness. 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 EXAM: 1. EMERGENT LARGE VESSEL OCCLUSION THROMBOLYSIS (POSTERIOR CIRCULATION) COMPARISON:  CT angiogram of the head and neck 01/09/2021. MEDICATIONS: Ancef 2 g IV antibiotic was administered within 1 hour of the procedure. ANESTHESIA/SEDATION: General anesthesia CONTRAST:  Omnipaque 300 approximately 75 cc. FLUOROSCOPY TIME:  Fluoroscopy Time: 92 minutes 40 seconds (7422 mGy). COMPLICATIONS: None immediate. TECHNIQUE: Patient's neurological status and imaging  findings were reviewed and discussed with Dr. Thomasena Edis, on-call neuro hospitalist. 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 Reasons were then made for patient's transfer to [facility] for endovascular treatment of the occluded basilar artery. Informed witness consent was not obtainable as the patient was intubated and under general anesthesia. No family members or next of kin were available via telephone or physically. Given the neuroimaging findings, and patient's clinical condition, an emergent consent was initiated. The patient was then put under general anesthesia by the Department of Anesthesiology at [facility]. The right groin was prepped and draped in the usual sterile fashion. Thereafter using modified Seldinger technique, transfemoral access into the right common femoral artery was obtained without difficulty. Over a 0.035 inch guidewire a 5 [place] Pinnacle 25 cm sheath was inserted. Through this, and also over a 0.035 inch guidewire a 5 [place] JB 1 catheter was advanced to the aortic arch region and selectively positioned in the left common carotid artery, the left vertebral artery, the right common carotid artery and the right vertebral artery. FINDINGS: Non dominant left vertebral artery demonstrates mild stenosis at its origin. The vessel demonstrates slow ascent of contrast to the cranial skull base to complete occlusion at the level of C2. Partial opacification is noted of the left vertebrobasilar junction just distal to the origin of the left posterior-inferior cerebellar artery, the posterior spinal artery. Incomplete opacification is seen of the left posterior-inferior cerebellar artery. 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 On the AP projection there is central prominent vessel which projects superiorly and in the midline opacifying the confluence of the vertebrobasilar junctions and retrogradely the right  vertebrobasilar junction with partial opacification of the proximal right posteroinferior cerebellar artery. No clearance of contrast is noted distally. The left common carotid arteriogram demonstrates patency of the left external carotid artery and its major branches. The left internal carotid artery at the bulb to the cranial skull base is widely patent. The petrous, the cavernous and the supraclinoid segments are widely patent. The left middle cerebral artery demonstrates a 50% stenosis in the mid M1 segment. More distally the trifurcation branches are widely patent. The left anterior cerebral artery opacifies into the capillary and venous phases. The innominate arteriogram demonstrates patency of the proximal right subclavian artery with moderate tortuosity. The origin of the right vertebral artery is noted with ascent of contrast to the cranial skull base. Right common carotid arteriogram demonstrates the right external carotid artery and its major branches to be widely patent. The right internal carotid artery at the bulb to the cranial skull base is widely patent. The intracranial portion was not examined. PROCEDURE: The 5 French diagnostic catheter was then advanced over a 0.035 inch Roadrunner guidewire to the distal right subclavian artery and exchanged over a stiff Glidewire for a 95 cm Neuron Max sheath inside of which was a 115 cm 5 [place] Catalyst guide catheter. The combination was then advanced and positioned just distal to the origin of the right vertebral artery. The exchange wire was removed. Combination of the 5 [place] Catalyst guide catheter and the Neuron Max sheath was then retrieved proximal to the origin of the right vertebral artery. Multiple attempts were then made using 035 inch guidewires, 016 micro guidewire with 021 microcatheter, and 014 inch micro guidewire with the 021 micro catheters without success. A radial  access was then obtained using ultrasound guidance. Initially the  morphology of the right radial artery was identified with ultrasound and documented. A dorsal palmar anastomosis was verified to be present. Over a 0.018 inch micro guidewire and a micropuncture set, access was obtained with ultrasound guidance with placement of a 6/7 French radial sheath. The micro guidewire and the obturator were removed. Good aspiration obtained from the side port of the radial sheath. A cocktail of 2000 units of heparin, and 2.5 mg of verapamil, and 200 mcg of nitroglycerin was then infused without event. Over a 0.035 inch Roadrunner guidewire, a 5 [place] JB 1 catheter, JB 2 catheter and a JB 3 catheter were advanced and multiple attempts were made to cannulate the right vertebral artery without success. This approach was then abandoned. Another attempt was then made through the right common femoral artery puncture site. A 5 [place] JB 3 diagnostic catheter was then advanced over a 0.035 inch glidewire to the distal right subclavian artery. The guidewire was then gently retrieved proximally with the microcatheter retrieved until it engaged the origin of the right vertebral artery. A 0.035 inch Roadrunner guidewire was then advanced more distally into the right vertebral artery followed by the 5 [place] JB 3 catheter. Good aspiration obtained from the hub of the diagnostic catheter following removal of the guidewire. A control arteriogram performed through the diagnostic catheter demonstrated slow ascent contrast to the vertebrobasilar junction with complete occlusion of the dominant right vertebral artery at the level of C1. Over a 300 cm exchange 035 inch guidewire, combination of the Catalyst 115 cm guide catheter inside of an 8 [place] Neuron Max sheath was advanced to the distal right vertebral artery. The guidewire was removed. Good aspiration obtained from the hub of the 5 [place] Catalyst guide catheter. A gentle contrast injection continued to demonstrate occluded dominant right vertebral  artery at the level of C1. At this time a combination of an 021 162 cm microcatheter and 014 inch Synchro standard micro guidewire with a moderate J configuration was advanced to the distal end of the 5 [place] Catalyst guide catheter. Using a torque device, gentle advancement was performed of the micro guidewire followed by the microcatheter to the site of the occluded dominant right vertebrobasilar junction proximal to the dominant right posteroinferior cerebellar artery and distally just proximal to the basilar artery. Eventually access with the micro guidewire was obtained followed by the microcatheter. The micro guidewire was then advanced to the P2 region of the left posterior cerebral artery followed by the microcatheter. The guidewire was removed. Good aspiration obtained from the hub of the catheter. Gentle control arteriogram demonstrated safe position of the tip of the microcatheter. This was then connected to continuous heparinized saline infusion. A 4 mm x 40 mm Solitaire X retrieval device was then advanced to the distal end of the microcatheter. The O ring on the delivery microcatheter was loosened and the delivery microcatheter was retrieved deploying the retrieval device. The 5 [place] Catalyst guide catheter was now advanced to the occluded segment of the dominant right vertebral artery proximal to the basilar artery. Thereafter constant aspiration was applied with a Penumbra aspiration device at the hub of the Catalyst guide catheter and with a 20 mL syringe at the hub of the Neuron Max sheath for approximately 3 minutes. The combination of the retrieval device, the microcatheter, and the 5 [place] Catalyst guide catheter was retrieved and removed. Following removal, there was free aspiration of blood at the hub of  the Neuron Max sheath at the level of the skull base. A control arteriogram performed now demonstrated revascularization of the right vertebrobasilar junction,the basilar artery, the  posterior cerebral arteries, the superior cerebellar arteries and the left anterior inferior cerebellar artery. Also there was opacification of the right posterior-inferior cerebellar artery and retrograde flow into the left vertebrobasilar junction. Moderate amount of thrombus was noted entangled in the struts of the retrieval device. Moderate spasm was noted in the mid basilar artery,the proximal basilar artery and the right vertebrobasilar junction. This responded to 2 aliquots of nitroglycerin intra-arterially. Severe intracranial stenosis was noted at two sites at the right vertebrobasilar junction, one distal and one proximal to the right posterior-inferior cerebellar artery. Measurements were then performed of the distal right vertebrobasilar junction and the proximal right vertebrobasilar junction. It was decided to proceed with placement of a 3 mm x 22 mm Resolute Onyx stent. Over a 0.014 inch standard Synchro micro guidewire, an 021 160 cm microcatheter was advanced coaxially with a 5 [place] Catalyst guide catheter to the distal end of the Neuron Max sheath in the right vertebral artery. Two areas of high-grade stenosis were then crossed with the micro guidewire followed by the microcatheter which was advanced to the distal basilar artery. The micro guidewire was removed. Good aspiration obtained from the hub of the microcatheter. This in turn was replaced with a 300 cm 014 inch Zoom exchange micro guidewire. The distal end of the Zoom guidewire had a mild J configuration. The 3 mm x 22 mm Resolute Onyx balloon mounted stent was then retrogradely purged with heparinized saline infusion, and antegradely with 50% contrast and 50% heparinized saline infusion. Using the rapid exchange technique, the balloon mountable stent delivery apparatus was advanced without difficulty and positioned adequate distance from the distal stenosis and proximal stenosis. This was then deployed with a micro inflation syringe  device via micro tubing to 12 atmospheres achieving a 3.05 mm diameter. The balloon was deflated and retrieved and removed. A control arteriogram performed through the Catalyst guide catheter demonstrated significantly improved caliber and flow through the stented segment at the site of the proximal stenosis. Suboptimal coverage was noted of the distal stenosis. This necessitated the placement of a second Resolute Onyx stent 3 mm x 15 mm. Having been prepped as described above, the stent apparatus was advanced and positioned such that the distal portion of the second stent was just proximal to the basilar artery. Thereafter, this was then deployed in the usual manner using micro inflation syringe device via micro tubing to over 3 mm at approximately 11 atmospheres. The balloon was deflated and retrieved and removed. A control arteriogram performed through the Catalyst guide catheter in the proximal right vertebrobasilar junction now demonstrated excellent flow through the right vertebral artery, the right vertebrobasilar junction, the posterior cerebral arteries, the superior cerebellar arteries and the left anterior inferior cerebellar artery. No significant flow was noted in the right anterior inferior cerebellar artery. There was retrograde flow into the left vertebrobasilar junction. Control arteriograms were then performed at approximally 10 and 25 minutes after placement of a second stent. These continued to demonstrate excellent flow without evidence of intraluminal filling defects. A TICI 2C revascularization of the vertebrobasilar system was noted as the right anterior inferior cerebellar artery was now angiographically verified. The 5 [place] Catalyst guide catheter was removed. The Neuron Max sheath was then retrieved into the proximal right vertebral artery. A final control arteriogram performed through this demonstrated wide patency of the  right vertebral artery intracranially and extracranially with no  change in the revascularized posterior fossa. The 8 French Pinnacle sheath was removed. Manual compression was applied for hemostasis in the right groin puncture site. Distal pulses remained Dopplerable in both feet unchanged. A CT scan of the brain obtained prior to the placement of the intracranial stents, and after continued to demonstrate no evidence of mass effect, midline shift or of intracranial hemorrhage. Also, the patient was loaded with 180 mg of Brilinta orally via an OG tube prior to placement of the intracranial stents. Also right prior to placement of the stent, the patient was given a half loading dose of Cangrelor IV followed by a 4 hour infusion. CT of the brain was to be obtained at 4 hours of the Cangrelor infusion. The patient's general anesthesia was reversed. Patient was extubated. Upon recovery, the patient was able to obey simple commands appropriately. Denied any headaches or nausea. The patient was able to move all four extremities. His range of movements was within normal limits. He was then transferred to the neuro ICU for post revascularization management. 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 PLAN: Follow-up in clinic 2 weeks post discharge. Electronically Signed   By: Julieanne Cotton M.D.   On: 01/11/2021 08:35   CT HEAD CODE STROKE WO CONTRAST  Result Date: 01/09/2021 CLINICAL DATA:  Code stroke.  Neuro deficit, weakness EXAM: CT HEAD WITHOUT CONTRAST TECHNIQUE: Contiguous axial images were obtained from the base of the skull through the vertex without intravenous contrast. COMPARISON:   04/07/2013 FINDINGS: Brain: No evidence of acute infarction, hemorrhage, cerebral edema, mass, mass effect, or midline shift. Ventricles and sulci are normal for age. No extra-axial fluid collection. Periventricular white matter changes, likely the sequela of chronic small vessel ischemic disease. Vascular: No hyperdense vessel or unexpected calcification. Skull: Normal. Negative for fracture or focal lesion. Sinuses/Orbits: No acute finding. Mild mucosal thickening in the sinuses. Other: The mastoid air cells are well aerated. ASPECTS [facility] Stroke Program Early CT Score) - Ganglionic level infarction (caudate, lentiform nuclei, internal capsule, insula, M1-M3 cortex): 7 - Supraganglionic infarction (M4-M6 cortex): 3 Total score (0-10 with 10 being normal): 10 IMPRESSION: 1. No acute intracranial process. 2. ASPECTS is 10 Code stroke imaging results were communicated on 01/09/2021 at 2:38 am to provider Dr. Dolores Frame Via telephone, who verbally acknowledged these results. Electronically Signed   By: Wiliam Ke M.D.   On: 01/09/2021 02:40   CT ANGIO HEAD NECK W WO CM (CODE STROKE)  Addendum Date: 01/09/2021   ADDENDUM REPORT: 01/09/2021 05:01 ADDENDUM: Critical Value/emergent results were called by telephone at the time of interpretation on 01/09/2021 at 0437 hours to Dr. Lesly Rubenstein SUNG , who verbally acknowledged these results. Electronically Signed   By: Odessa Fleming M.D.   On: 01/09/2021 05:01   Result Date: 01/09/2021 CLINICAL DATA:  60 year old male code stroke presentation. Weakness. EXAM: CT ANGIOGRAPHY HEAD AND NECK TECHNIQUE: Multidetector CT imaging of the head and neck was performed using the standard protocol during bolus administration of intravenous contrast. Multiplanar CT image reconstructions and MIPs were obtained to evaluate the vascular anatomy. Carotid stenosis measurements (when applicable) are obtained utilizing NASCET criteria, using the  distal internal carotid diameter as the denominator.  CONTRAST:  63mL OMNIPAQUE IOHEXOL 350 MG/ML SOLN COMPARISON:  Noncontrast head CT 0226 hours. FINDINGS: CTA NECK Skeleton: Prior sternotomy. Carious left posterior mandible dentition. Cervical spine degeneration. No acute osseous abnormality identified. Upper chest: Mediastinal lipomatosis. Prior CABG. Partially visible cardiomegaly. No evidence of pericardial effusion or mediastinal lymphadenopathy. Visible major airways appear patent, minor atelectasis. Other neck: Negative. Aortic arch: 3 vessel arch configuration. Minor arch atherosclerosis. Right carotid system: Mildly tortuous brachiocephalic artery and proximal right CCA without stenosis. Mild soft plaque proximal to the right carotid bifurcation without stenosis. Partially retropharyngeal course of the bifurcation and right ICA. Mild partially calcified atherosclerosis without stenosis. Left carotid system: Negative left CCA origin. Mildly tortuous left CCA with no significant plaque or stenosis. Partially retropharyngeal left carotid bifurcation and ICA. Mild mostly soft plaque of the proximal ICA without stenosis. Mild tortuosity of the vessel in calcified plaque just below the skull base. Vertebral arteries: Mildly tortuous proximal right subclavian artery without stenosis. Normal right vertebral artery origin. Gradient of decreasing contrast opacification throughout the cervical right vertebral artery which is occluded in the V3 segment leading up to the skull base (series 7, image 161). Mild plaque in the proximal left subclavian artery without stenosis. Mild plaque at the left vertebral artery origin without stenosis. Asymmetrically better enhancement of the cervical left vertebral artery, however, the vessel is tapered and abruptly occluded at the distal V3 segment at the skull base on series 7, image 162. CTA HEAD Posterior circulation: Bilateral proximal vertebral artery V4 segments are occluded, but reconstituted at the PICA origins. Distal right  V4 enhances somewhat better. Patent vertebrobasilar junction without stenosis. Patent proximal basilar artery. However, there is a 4-5 mm filling defect in the distal basilar extending to the level of the SCA origins compatible with acute thrombosis. See series 7, image 116 and series 8, image 123. Despite this the basilar tip and PCA origins remain patent. The right SCA is occluded. The left SCA remains patent. Posterior communicating arteries are diminutive or absent. Bilateral PCA branches are patent but irregular, with mild to moderate irregularity and stenosis which appears maximal at the right P3 bifurcation such as series 10, image 56. Anterior circulation: Both ICA siphons are patent. Bilateral siphon atherosclerosis is mild without stenosis. Normal ophthalmic artery origins. Patent carotid termini. Patent MCA and ACA origins. Right A1 appears somewhat dominant. Normal anterior communicating artery. Bilateral ACA branches are within normal limits. Left MCA M1 segment and trifurcation are patent without stenosis. Right MCA M1 segment and bifurcation are patent without stenosis. Bilateral MCA branches are mildly irregular. No MCA occlusion is identified. Venous sinuses: Early contrast timing, grossly patent. Anatomic variants: Mildly dominant right A1. Review of the MIP images confirms the above findings IMPRESSION: 1. 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 Both vertebral arteries occlude at the skull base. And there is a a 4-5 mm segment of thrombus at the Distal Basilar at the level of the SCAs. The Right SCA origin is occluded. But the Basilar tip and PCAs remain patent. There is mild to moderate irregularity and stenosis of the distal PCAs greater on the right. 2. Bilateral carotid artery atherosclerosis without hemodynamically significant stenosis. Mild intracranial atherosclerosis. 3. Prior CABG.  Mild aortic arch atherosclerosis.  Electronically Signed: By: Odessa Fleming M.D. On: 01/09/2021 04:33   IR ANGIO INTRA EXTRACRAN SEL COM CAROTID INNOMINATE UNI R MOD SED  Result Date: 01/11/2021  INDICATION: Speech difficulty, gait instability and bilateral lower extremity weakness. 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 EXAM: 1. EMERGENT LARGE VESSEL OCCLUSION THROMBOLYSIS (POSTERIOR CIRCULATION) COMPARISON:  CT angiogram of the head and neck 01/09/2021. MEDICATIONS: Ancef 2 g IV antibiotic was administered within 1 hour of the procedure. ANESTHESIA/SEDATION: General anesthesia CONTRAST:  Omnipaque 300 approximately 75 cc. FLUOROSCOPY TIME:  Fluoroscopy Time: 92 minutes 40 seconds (7422 mGy). COMPLICATIONS: None immediate. TECHNIQUE: Patient's neurological status and imaging findings were reviewed and discussed with Dr. Thomasena Edis, on-call neuro hospitalist. 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 Reasons were then made for patient's transfer to [facility] for endovascular treatment of the occluded basilar artery. Informed witness consent was not obtainable as the patient was intubated and under general anesthesia. No family members or next of kin were available via telephone or physically. Given the neuroimaging findings, and patient's clinical condition, an emergent consent was initiated. The patient was then put under general anesthesia by the Department of Anesthesiology at [facility]. The right groin was prepped and draped in the usual sterile fashion. Thereafter using modified Seldinger technique, transfemoral access into the right common femoral artery was obtained without difficulty. Over a 0.035 inch guidewire a 5 [place] Pinnacle 25 cm sheath was inserted. Through this, and also over a 0.035 inch guidewire a 5 [place] JB 1 catheter was advanced to the aortic arch region and selectively positioned in  the left common carotid artery, the left vertebral artery, the right common carotid artery and the right vertebral artery. FINDINGS: Non dominant left vertebral artery demonstrates mild stenosis at its origin. The vessel demonstrates slow ascent of contrast to the cranial skull base to complete occlusion at the level of C2. Partial opacification is noted of the left vertebrobasilar junction just distal to the origin of the left posterior-inferior cerebellar artery, the posterior spinal artery. Incomplete opacification is seen of the left posterior-inferior cerebellar artery. 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 On the AP projection there is central prominent vessel which projects superiorly and in the midline opacifying the confluence of the vertebrobasilar junctions and retrogradely the right vertebrobasilar junction with partial opacification of the proximal right posteroinferior cerebellar artery. No clearance of contrast is noted distally. The left common carotid arteriogram demonstrates patency of the left external carotid artery and its major branches. The left internal carotid artery at the bulb to the cranial skull base is widely patent. The petrous, the cavernous and the supraclinoid segments are widely patent. The left middle cerebral artery demonstrates a 50% stenosis in the mid M1 segment. More distally the trifurcation branches are widely patent. The left anterior cerebral artery opacifies into the capillary and venous phases. The innominate arteriogram demonstrates patency of the proximal right subclavian artery with moderate tortuosity. The origin of the right vertebral artery is noted with ascent of contrast to the cranial skull base. Right common carotid arteriogram demonstrates the right external carotid artery and its major branches to be widely patent. The right internal carotid artery at the bulb to the  cranial skull base is widely patent. The intracranial portion was not examined. PROCEDURE: The 5 French diagnostic catheter was then advanced over a 0.035 inch Roadrunner guidewire to the distal right subclavian artery and exchanged over a stiff Glidewire for a 95 cm Neuron Max sheath inside of which was  a 115 cm 5 [place] Catalyst guide catheter. The combination was then advanced and positioned just distal to the origin of the right vertebral artery. The exchange wire was removed. Combination of the 5 [place] Catalyst guide catheter and the Neuron Max sheath was then retrieved proximal to the origin of the right vertebral artery. Multiple attempts were then made using 035 inch guidewires, 016 micro guidewire with 021 microcatheter, and 014 inch micro guidewire with the 021 micro catheters without success. A radial access was then obtained using ultrasound guidance. Initially the morphology of the right radial artery was identified with ultrasound and documented. A dorsal palmar anastomosis was verified to be present. Over a 0.018 inch micro guidewire and a micropuncture set, access was obtained with ultrasound guidance with placement of a 6/7 French radial sheath. The micro guidewire and the obturator were removed. Good aspiration obtained from the side port of the radial sheath. A cocktail of 2000 units of heparin, and 2.5 mg of verapamil, and 200 mcg of nitroglycerin was then infused without event. Over a 0.035 inch Roadrunner guidewire, a 5 [place] JB 1 catheter, JB 2 catheter and a JB 3 catheter were advanced and multiple attempts were made to cannulate the right vertebral artery without success. This approach was then abandoned. Another attempt was then made through the right common femoral artery puncture site. A 5 [place] JB 3 diagnostic catheter was then advanced over a 0.035 inch glidewire to the distal right subclavian artery. The guidewire was then gently retrieved proximally with the microcatheter retrieved  until it engaged the origin of the right vertebral artery. A 0.035 inch Roadrunner guidewire was then advanced more distally into the right vertebral artery followed by the 5 [place] JB 3 catheter. Good aspiration obtained from the hub of the diagnostic catheter following removal of the guidewire. A control arteriogram performed through the diagnostic catheter demonstrated slow ascent contrast to the vertebrobasilar junction with complete occlusion of the dominant right vertebral artery at the level of C1. Over a 300 cm exchange 035 inch guidewire, combination of the Catalyst 115 cm guide catheter inside of an 8 [place] Neuron Max sheath was advanced to the distal right vertebral artery. The guidewire was removed. Good aspiration obtained from the hub of the 5 [place] Catalyst guide catheter. A gentle contrast injection continued to demonstrate occluded dominant right vertebral artery at the level of C1. At this time a combination of an 021 162 cm microcatheter and 014 inch Synchro standard micro guidewire with a moderate J configuration was advanced to the distal end of the 5 [place] Catalyst guide catheter. Using a torque device, gentle advancement was performed of the micro guidewire followed by the microcatheter to the site of the occluded dominant right vertebrobasilar junction proximal to the dominant right posteroinferior cerebellar artery and distally just proximal to the basilar artery. Eventually access with the micro guidewire was obtained followed by the microcatheter. The micro guidewire was then advanced to the P2 region of the left posterior cerebral artery followed by the microcatheter. The guidewire was removed. Good aspiration obtained from the hub of the catheter. Gentle control arteriogram demonstrated safe position of the tip of the microcatheter. This was then connected to continuous heparinized saline infusion. A 4 mm x 40 mm Solitaire X retrieval device was then advanced to the distal end of the  microcatheter. The O ring on the delivery microcatheter was loosened and the delivery microcatheter was retrieved deploying the retrieval device. The 5 [place] Catalyst guide catheter was  now advanced to the occluded segment of the dominant right vertebral artery proximal to the basilar artery. Thereafter constant aspiration was applied with a Penumbra aspiration device at the hub of the Catalyst guide catheter and with a 20 mL syringe at the hub of the Neuron Max sheath for approximately 3 minutes. The combination of the retrieval device, the microcatheter, and the 5 [place] Catalyst guide catheter was retrieved and removed. Following removal, there was free aspiration of blood at the hub of the Neuron Max sheath at the level of the skull base. A control arteriogram performed now demonstrated revascularization of the right vertebrobasilar junction,the basilar artery, the posterior cerebral arteries, the superior cerebellar arteries and the left anterior inferior cerebellar artery. Also there was opacification of the right posterior-inferior cerebellar artery and retrograde flow into the left vertebrobasilar junction. Moderate amount of thrombus was noted entangled in the struts of the retrieval device. Moderate spasm was noted in the mid basilar artery,the proximal basilar artery and the right vertebrobasilar junction. This responded to 2 aliquots of nitroglycerin intra-arterially. Severe intracranial stenosis was noted at two sites at the right vertebrobasilar junction, one distal and one proximal to the right posterior-inferior cerebellar artery. Measurements were then performed of the distal right vertebrobasilar junction and the proximal right vertebrobasilar junction. It was decided to proceed with placement of a 3 mm x 22 mm Resolute Onyx stent. Over a 0.014 inch standard Synchro micro guidewire, an 021 160 cm microcatheter was advanced coaxially with a 5 [place] Catalyst guide catheter to the distal end of the  Neuron Max sheath in the right vertebral artery. Two areas of high-grade stenosis were then crossed with the micro guidewire followed by the microcatheter which was advanced to the distal basilar artery. The micro guidewire was removed. Good aspiration obtained from the hub of the microcatheter. This in turn was replaced with a 300 cm 014 inch Zoom exchange micro guidewire. The distal end of the Zoom guidewire had a mild J configuration. The 3 mm x 22 mm Resolute Onyx balloon mounted stent was then retrogradely purged with heparinized saline infusion, and antegradely with 50% contrast and 50% heparinized saline infusion. Using the rapid exchange technique, the balloon mountable stent delivery apparatus was advanced without difficulty and positioned adequate distance from the distal stenosis and proximal stenosis. This was then deployed with a micro inflation syringe device via micro tubing to 12 atmospheres achieving a 3.05 mm diameter. The balloon was deflated and retrieved and removed. A control arteriogram performed through the Catalyst guide catheter demonstrated significantly improved caliber and flow through the stented segment at the site of the proximal stenosis. Suboptimal coverage was noted of the distal stenosis. This necessitated the placement of a second Resolute Onyx stent 3 mm x 15 mm. Having been prepped as described above, the stent apparatus was advanced and positioned such that the distal portion of the second stent was just proximal to the basilar artery. Thereafter, this was then deployed in the usual manner using micro inflation syringe device via micro tubing to over 3 mm at approximately 11 atmospheres. The balloon was deflated and retrieved and removed. A control arteriogram performed through the Catalyst guide catheter in the proximal right vertebrobasilar junction now demonstrated excellent flow through the right vertebral artery, the right vertebrobasilar junction, the posterior cerebral  arteries, the superior cerebellar arteries and the left anterior inferior cerebellar artery. No significant flow was noted in the right anterior inferior cerebellar artery. There was retrograde flow into the left vertebrobasilar  junction. Control arteriograms were then performed at approximally 10 and 25 minutes after placement of a second stent. These continued to demonstrate excellent flow without evidence of intraluminal filling defects. A TICI 2C revascularization of the vertebrobasilar system was noted as the right anterior inferior cerebellar artery was now angiographically verified. The 5 [place] Catalyst guide catheter was removed. The Neuron Max sheath was then retrieved into the proximal right vertebral artery. A final control arteriogram performed through this demonstrated wide patency of the right vertebral artery intracranially and extracranially with no change in the revascularized posterior fossa. The 8 French Pinnacle sheath was removed. Manual compression was applied for hemostasis in the right groin puncture site. Distal pulses remained Dopplerable in both feet unchanged. A CT scan of the brain obtained prior to the placement of the intracranial stents, and after continued to demonstrate no evidence of mass effect, midline shift or of intracranial hemorrhage. Also, the patient was loaded with 180 mg of Brilinta orally via an OG tube prior to placement of the intracranial stents. Also right prior to placement of the stent, the patient was given a half loading dose of Cangrelor IV followed by a 4 hour infusion. CT of the brain was to be obtained at 4 hours of the Cangrelor infusion. The patient's general anesthesia was reversed. Patient was extubated. Upon recovery, the patient was able to obey simple commands appropriately. Denied any headaches or nausea. The patient was able to move all four extremities. His range of movements was within normal limits. He was then transferred to the neuro ICU for  post revascularization management. 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 PLAN: Follow-up in clinic 2 weeks post discharge. Electronically Signed   By: Julieanne Cotton M.D.   On: 01/11/2021 08:35      PHYSICAL EXAM  Temp:  [97.7 F (36.5 C)-98.6 F (37 C)] 98.2 F (36.8 C) (10/21 1321) Pulse Rate:  [66-80] 76 (10/21 1321) Resp:  [16-19] 19 (10/21 1321) BP: (115-164)/(72-106) 132/92 (10/21 1321) SpO2:  [95 %-99 %] 95 % (10/21 1321)  General - Well nourished, well developed, in no apparent distress.  Ophthalmologic - fundi not visualized due to noncooperation.  Cardiovascular - Regular rhythm and rate.  Neuro - awake, alert, eyes open, orientated to age, place, and time. No aphasia, fluent language, able to name and repeat, following all simple commands. Slight dysarthria. No gaze palsy, however, with lateral gaze showed unsustained b/l bidirectional nystagmus, no disconjugate eyes. tracking bilaterally, visual field full, PERRL. Slight right nasolabial fold flattening. Tongue midline. Bilateral UEs 5/5, no drift except right hand dexterity mild difficulty. Bilaterally LEs equal strength. Sensation symmetric today, R HTN and FTN slight dysmetria, gait not tested.     ASSESSMENT/PLAN Joshua [name] is a 60 y.o. male with history of CHF, CAD/NSTEMI s/p CABG, HTN, obesity admitted for speech difficulty, b/l LE weakness and gait difficulty. No tPA given due to outside window.    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  Cardioembolic source is also in DDx CT no acute finding CTA head and neck - b/l V3 and V4 occlusion, BA thrombus at  SCA level with right SCA occlusion S/p IR occluded bilateral VBJs  and BA with TICI2c and right VBJ stenting CT repeat suspect small right cerebellar infarct MRI Confluent right cerebellar SCA territory infarct with numerous other small infarcts scattered throughout the bilateral posterior circulation, including: Right lateral medulla, bilateral cerebellar hemispheres, right midbrain, right thalamus, bilateral occipital lobes. MRA posterior circulation patent now 2D Echo EF 35 to 40% LDL 123 HgbA1c 5.3 SCDs for VTE prophylaxis aspirin 81 mg daily prior to admission, now on aspirin 81 mg daily and Brilinta (ticagrelor) 90 mg bid. Ongoing aggressive stroke risk factor management Therapy recommendations:  CIR  Disposition:  pending  CHF  Home meds - coreg, entresto, lasix, spironolactone 06/2016 EF 20 to 25% 11/2016 EF 35 to 40% This admission TTE EF 35 to 40% On coreg, lasix and spironolactone  Gentle hydration  Hypertension stable Off cleviprex On coreg, lasix and spironolactone  Avoid low BP Long term BP goal normotensive  Hyperlipidemia Home meds:  lipitor 40  LDL 123, goal < 70 On lipitor 80 Continue statin at discharge  Other Stroke Risk Factors Obesity, There is no height or weight on file to calculate BMI.  Coronary artery disease s/p CABG  Other Active Problems Leukocytosis WBC 11.2->8.7 Polycythemia Hb 18.1->16.2  Hospital day # 3   Marvel Plan, MD PhD Stroke Neurology 01/12/2021 1:29 PM    To contact Stroke Continuity provider, please refer to [email or web address]. After hours, contact General Neurology

## 2021-01-12 NOTE — Progress Notes (Signed)
Inpatient Rehabilitation Admissions Coordinator   I have insurance approval and CIR bed to admit him to on Sunday. I met with patient at bedside and he is aware and in agreement. I have alerted acute team and TOC. [place] can call rehab unit Sunday at 12 noon at [phone] to give report and clarify room number. Dr Ranell Patrick will be admitting MD this weekend.I will make the arrangements.  Danne Baxter, RN, MSN Rehab Admissions Coordinator [phone] 01/12/2021 12:23 PM

## 2021-01-12 NOTE — Progress Notes (Signed)
Occupational Therapy Treatment Patient Details Name: Joshua [name] MRN: [id number] DOB: [birth year] Today's Date: 01/12/2021   History of present illness 60 y/o male presented to [facility] ED on 10/18 with speech difficulty and gait instability/bilateral LE weakness. Transferred to [facility]. 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 S/p thrombectomy on 10/18. PMH: CAD, HTN, CHF, NSTEMI.   OT comments  Pt making good progress with OT goals. This session, he completed a path-finding activity, requiring increased cuing and support/encouragement due to difficulty remembering steps and problem solving. Pt requiring min guard and at times min A when making quick turns due to balance. Additionally, pt was provided theraputty and a squeeze ball to work on dexterity and grip strength with RUE, as pt is very concerned that he has difficulty writing. OT will continue to follow acutely.    Recommendations for follow up therapy are one component of a multi-disciplinary discharge planning process, led by the attending physician.  Recommendations may be updated based on patient status, additional functional criteria and insurance authorization.    Follow Up Recommendations  CIR    Equipment Recommendations  Other (comment) (RW)    Recommendations for Other Services Rehab consult    Precautions / Restrictions Precautions Precautions: Fall Precaution Comments: Rt sided ataxia Restrictions Weight Bearing Restrictions: No       Mobility Bed Mobility Overal bed mobility: Needs Assistance Bed Mobility: Supine to Sit;Sit to Supine     Supine to sit: Supervision;HOB elevated Sit to supine: Supervision;HOB elevated   General bed mobility comments: No assist needed, use of rails.    Transfers Overall transfer level: Needs assistance Equipment used: Rolling walker (2 wheeled);None Transfers: Sit to/from Stand Sit  to Stand: Min guard         General transfer comment: Min guard for safety, cues for hand placement/technique as pt wanting to pull up onRw. Stood from [employer] for strengthening/functional mobility.    Balance Overall balance assessment: Needs assistance Sitting-balance support: Feet supported;No upper extremity supported Sitting balance-Leahy Scale: Good Sitting balance - Comments: Able to reach down and adjust socks   Standing balance support: During functional activity Standing balance-Leahy Scale: Fair Standing balance comment: static standing unsupported  but needing minA and/or UE support for walking                           ADL either performed or assessed with clinical judgement   ADL Overall ADL's : Needs assistance/impaired     Grooming: Wash/dry hands;Supervision/safety;Standing Grooming Details (indicate cue type and reason): completed at sink         Upper Body Dressing : Min guard;Minimal assistance;Standing Upper Body Dressing Details (indicate cue type and reason): increased assist due to donning gown in standing     Toilet Transfer: Min guard;Ambulation Toilet Transfer Details (indicate cue type and reason): Completed in bathroom         Functional mobility during ADLs: Min guard;Minimal assistance;Rolling walker General ADL Comments: increased assist when making turns and requiring increased assurance and repeating steps to complete tasks     Vision       Perception     Praxis      Cognition Arousal/Alertness: Awake/alert Behavior During Therapy: Anxious Overall Cognitive Status: No family/caregiver present to determine baseline cognitive functioning  General Comments: Pt Apologizing throughout the session, requiring encouragment, and requiring increased assurance with all tasks. Difficulty remaining on topic at times when completing path finding, requirng increased cuing         Exercises     Shoulder Instructions       General Comments Pt completed path finding this session, requiring increased reassurance that he was doing the right thing, increased cuing to stay on tasks, and repeating steps muliple times during task    Pertinent Vitals/ Pain       Pain Assessment: No/denies pain  Home Living                                          Prior Functioning/Environment              Frequency  Min 2X/week        Progress Toward Goals  OT Goals(current goals can now be found in the care plan section)  Progress towards OT goals: Progressing toward goals  Acute Rehab OT Goals Patient Stated Goal: to get better so he can work OT Goal Formulation: With patient Time For Goal Achievement: 01/24/21 Potential to Achieve Goals: Good ADL Goals Pt Will Perform Lower Body Bathing: with modified independence;sit to/from stand Pt Will Perform Lower Body Dressing: with modified independence;sit to/from stand Pt Will Transfer to Toilet: with modified independence;ambulating Pt Will Perform Toileting - Clothing Manipulation and hygiene: with modified independence;sit to/from stand Additional ADL Goal #1: Complete 3 step trail making tasks independently in moderatly distracting enviroment Additional ADL Goal #2: Pt will independently verbalize 3 strategies to reduce risk of falls  Plan Discharge plan remains appropriate;Frequency remains appropriate    Co-evaluation                 AM-PAC OT "6 Clicks" Daily Activity     Outcome Measure   Help from another person eating meals?: None Help from another person taking care of personal grooming?: A Little Help from another person toileting, which includes using toliet, bedpan, or urinal?: A Little Help from another person bathing (including washing, rinsing, drying)?: A Little Help from another person to put on and taking off regular upper body clothing?: A Little Help from another  person to put on and taking off regular lower body clothing?: A Little 6 Click Score: 19    End of Session Equipment Utilized During Treatment: Gait belt;Rolling walker  OT Visit Diagnosis: Unsteadiness on feet (R26.81);Other abnormalities of gait and mobility (R26.89);Muscle weakness (generalized) (M62.81);Ataxia, unspecified (R27.0);Other symptoms and signs involving cognitive function   Activity Tolerance Patient tolerated treatment well   Patient Left in chair;with call bell/phone within reach;with chair alarm set   Nurse Communication Mobility status;Other (comment)        Time: 9379-0240 OT Time Calculation (min): 31 min  Charges: OT General Charges $OT Visit: 1 Visit OT Treatments $Therapeutic Activity: 23-37 mins  Pattijo Juste H., OTR/L Acute Rehabilitation  Claretta Kendra Elane Jupiter Boys 01/12/2021, 5:02 PM

## 2021-01-12 NOTE — Progress Notes (Signed)
At earlier visit with pt, he had expressed that he was not able to pay for the prescribed Brilinta. Pt RN, Arlys John was given 9 sample bottles of Brilinta to hold for pt to use after d/c from facility. Pt was given an application to apply for Brilinta fee for 1 year as well. Pt was very thankful.  Brilinta samples were provided by the IR office manager.   Electronically Signed: Shon Hough 01/12/2021, 11:50 AM

## 2021-01-13 ENCOUNTER — Other Ambulatory Visit: Payer: Self-pay

## 2021-01-13 ENCOUNTER — Encounter (HOSPITAL_COMMUNITY): Payer: Self-pay | Admitting: Neurology

## 2021-01-13 DIAGNOSIS — I651 Occlusion and stenosis of basilar artery: Secondary | ICD-10-CM

## 2021-01-13 LAB — CBC
HCT: 48.2 % (ref 39.0–52.0)
Hemoglobin: 16.6 g/dL (ref 13.0–17.0)
MCH: 30.5 pg (ref 26.0–34.0)
MCHC: 34.4 g/dL (ref 30.0–36.0)
MCV: 88.6 fL (ref 80.0–100.0)
Platelets: 198 10*3/uL (ref 150–400)
RBC: 5.44 MIL/uL (ref 4.22–5.81)
RDW: 14.4 % (ref 11.5–15.5)
WBC: 9.7 10*3/uL (ref 4.0–10.5)
nRBC: 0 % (ref 0.0–0.2)

## 2021-01-13 LAB — BASIC METABOLIC PANEL
Anion gap: 11 (ref 5–15)
BUN: 24 mg/dL — ABNORMAL HIGH (ref 6–20)
CO2: 23 mmol/L (ref 22–32)
Calcium: 9.3 mg/dL (ref 8.9–10.3)
Chloride: 100 mmol/L (ref 98–111)
Creatinine, Ser: 1.14 mg/dL (ref 0.61–1.24)
GFR, Estimated: >60 mL/min (ref >60)
Glucose, Bld: 101 mg/dL — ABNORMAL HIGH (ref 70–99)
Potassium: 3.8 mmol/L (ref 3.5–5.1)
Sodium: 134 mmol/L — ABNORMAL LOW (ref 135–145)

## 2021-01-13 MED ORDER — POLYETHYLENE GLYCOL 3350 17 G PO PACK
17.0000 g | PACK | Freq: Every day | ORAL | Status: DC
  Administered 2021-01-14: 17 g via ORAL
  Filled 2021-01-13: qty 1

## 2021-01-13 MED ORDER — SENNA 8.6 MG PO TABS
1.0000 | ORAL_TABLET | Freq: Every day | ORAL | Status: DC
  Administered 2021-01-14: 8.6 mg via ORAL
  Filled 2021-01-13: qty 1

## 2021-01-13 NOTE — Progress Notes (Addendum)
STROKE TEAM PROGRESS NOTE   INTERVAL HISTORY He is lying in bed in NAD. No family present at bedside. Patient on portable monitor showing trigeminy. Will obtain EKG today. He voices no new neurological complaints. Still complains of right side weakness and trouble with his right hand/fingers. Requesting stool softener. Awaiting CIR with possible bed opening tomorrow, he is aware of this  Vital signs are stable.  Neurological exam is unchanged Vitals:   01/13/21 0029 01/13/21 0458 01/13/21 0700 01/13/21 1148  BP: (!) 146/91 (!) 135/98 (!) 146/97 118/86  Pulse: 77 71 71 82  Resp: 15 17 16 14   Temp: 98.2 F (36.8 C) 97.8 F (36.6 C) 98.1 F (36.7 C) (!) 101.9 F (38.8 C)  TempSrc: Oral Oral Oral Oral  SpO2: 96% 95% 95% 93%   CBC:  Recent Labs  Lab 01/09/21 0240 01/10/21 0449 01/13/21 0222  WBC 11.2* 8.7 9.7  NEUTROABS 9.8* 6.5  --   HGB 18.1* 16.2 16.6  HCT 53.0* 47.4 48.2  MCV 89.7 89.1 88.6  PLT 202 196 198   Basic Metabolic Panel:  Recent Labs  Lab 01/10/21 0449 01/13/21 0222  NA 136 134*  K 3.8 3.8  CL 102 100  CO2 21* 23  GLUCOSE 104* 101*  BUN 13 24*  CREATININE 1.08 1.14  CALCIUM 8.8* 9.3   Lipid Panel:  Recent Labs  Lab 01/10/21 0449  CHOL 194  TRIG 205*  HDL 30*  CHOLHDL 6.5  VLDL 41*  LDLCALC 123*   HgbA1c:  Recent Labs  Lab 01/10/21 0449  HGBA1C 5.3   Urine Drug Screen:  Recent Labs  Lab 01/09/21 0515  LABOPIA NONE DETECTED  COCAINSCRNUR NONE DETECTED  LABBENZ NONE DETECTED  AMPHETMU NONE DETECTED  THCU NONE DETECTED  LABBARB NONE DETECTED    Alcohol Level  Recent Labs  Lab 01/09/21 0240  ETH <10    IMAGING past 24 hours No results found.  PHYSICAL EXAM  Temp:  [97.8 F (36.6 C)-101.9 F (38.8 C)] 101.9 F (38.8 C) (10/22 1148) Pulse Rate:  [71-82] 82 (10/22 1148) Resp:  [14-19] 14 (10/22 1148) BP: (118-146)/(86-98) 118/86 (10/22 1148) SpO2:  [93 %-98 %] 93 % (10/22 1148)  General -obese middle-aged Caucasian male  in no apparent distress.  Ophthalmologic - fundi not visualized due to noncooperation.  Cardiovascular - in trigeminy on monitor. Will obtain EKG  Mental Status -  Level of arousal and orientation to time, place, and person were intact. Language including expression, naming, repetition, comprehension was assessed and found intact. Attention span and concentration were normal. Recent and remote memory were intact. Fund of Knowledge was assessed and was intact.  Cranial Nerves II - XII - II - Visual field intact OU. III, IV, VI - Extraocular movements intact. V - Facial sensation intact bilaterally. VII - slight right facial droop (baseline) VIII - Hearing & vestibular intact bilaterally. X - Palate elevates symmetrically. XI - Chin turning & shoulder shrug intact bilaterally. XII - Tongue protrusion intact.  Motor Strength - RUE slightly weaker than right. Trouble with fine finger motor movements. The patient's strength was normal in LUE, LLE, and RLE extremities and pronator drift was absent.  Bulk was normal and fasciculations were absent.   Motor Tone - Muscle tone was assessed at the neck and appendages and was normal  Sensory - Light touch, temperature/pinprick were assessed and were symmetrical.    Coordination - slight dysmetria in Right FTN  Gait and Station - deferred.   ASSESSMENT/PLAN  Mr. Jerry Clyne is a 60 y.o. male with history of  CHF, CAD/NSTEMI s/p CABG, HTN, obesity admitted for speech difficulty, b/l LE weakness and gait difficulty. No tPA given due to outside window.    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  S/p angioplasty stenting  CT no acute finding CTA head and neck - b/l V3 and V4 occlusion, BA thrombus at SCA level with right SCA occlusion S/p IR occluded bilateral VBJs  and BA with TICI2c and right VBJ stenting CT repeat suspect small right cerebellar  infarct MRI Confluent right cerebellar SCA territory infarct with numerous other small infarcts scattered throughout the bilateral posterior circulation, including: Right lateral medulla, bilateral cerebellar hemispheres, right midbrain, right thalamus, bilateral occipital lobes. MRA posterior circulation patent now 2D Echo EF 35 to 40% LDL 123 HgbA1c 5.3 SCDs for VTE prophylaxis aspirin 81 mg daily prior to admission, now on aspirin 81 mg daily and Brilinta (ticagrelor) 90 mg bid. Ongoing aggressive stroke risk factor management Therapy recommendations:  CIR possibly tomorrow  Disposition:  pending   CHF  Home meds - coreg, entresto, lasix, spironolactone 06/2016 EF 20 to 25% 11/2016 EF 35 to 40% This admission TTE EF 35 to 40% On coreg, lasix and spironolactone  Gentle hydration  Hypertension stable Off cleviprex On coreg, lasix and spironolactone  Avoid low BP Long term BP goal normotensive  Hyperlipidemia Home meds:  lipitor 40  LDL 123, goal < 70 On lipitor 80 Continue statin at discharge    Other Stroke Risk Factors Obesity, There is no height or weight on file to calculate BMI.  Coronary artery disease s/p CABG    Other Active Problems Leukocytosis, resolved WBC 11.2->8.7->9.7 Polycythemia, improved Hb 18.1->16.2  Hospital day # 4  Gevena Mart, NP  I have personally obtained history,examined this patient, reviewed notes, independently viewed imaging studies, participated in medical decision making and plan of care.ROS completed by me personally and pertinent positives fully documented  I have made any additions or clarifications directly to the above note. Agree with note above.  Patient is doing remarkably well.  Continue dual antiplatelet therapy of aspirin and Brilinta for his vertebrobasilar stenting.  Continue ongoing therapy and transfer to inpatient rehab when bed available tomorrow.  Greater than 50% time during this 25-minute visit was spent in counseling  and coordination of care and discussion with patient and care team.  Delia Heady, MD Medical Director [facility] Pager: [phone] 01/13/2021 2:40 PM  To contact Stroke Continuity provider, please refer to [email or web address]. After hours, contact General Neurology

## 2021-01-13 NOTE — Progress Notes (Signed)
Physical Therapy Treatment Patient Details Name: Joshua [name] MRN: [id number] DOB: [birth year] Today's Date: 01/13/2021   History of Present Illness 60 y/o male presented to [facility] ED on 10/18 with speech difficulty and gait instability/bilateral LE weakness. Transferred to [facility]. 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 S/p thrombectomy on 10/18. PMH: CAD, HTN, CHF, NSTEMI.    PT Comments    Today's skilled session focused on strengthening and balance with less overall assistance needed. Was able to progress short distance gait with no device with up to min assist. Pt continues to need redirection to tasks and emotional. Remains appropriate for CIR.    Recommendations for follow up therapy are one component of a multi-disciplinary discharge planning process, led by the attending physician.  Recommendations may be updated based on patient status, additional functional criteria and insurance authorization.  Follow Up Recommendations  CIR     Equipment Recommendations  Rolling walker with 5" wheels    Recommendations for Other Services Rehab consult     Precautions / Restrictions Precautions Precautions: Fall Precaution Comments: Rt sided ataxia Restrictions Weight Bearing Restrictions: No     Mobility  Bed Mobility Overal bed mobility: Needs Assistance Bed Mobility: Supine to Sit;Sit to Supine     Supine to sit: Supervision Sit to supine: Supervision   General bed mobility comments: bed flat with rails used    Transfers Overall transfer level: Needs assistance Equipment used: None Transfers: Sit to/from Stand Sit to Stand: Min guard;Supervision         General transfer comment: min guard with 1st stand, supervision for 2cd stand. cues for hand placement with 1st stand as pt went to reach to pull up on PTA. no cues needed with 2cd stand.  Ambulation/Gait Ambulation/Gait  assistance: Min guard;Min assist Gait Distance (Feet): 20 Feet Assistive device: None Gait Pattern/deviations: Step-through pattern;Decreased stride length;Ataxic Gait velocity: decreased   General Gait Details: no UE support with PTA in front of pt cueing pt "step to my foot" to work on both step placement and step length. decreased ataxia noted with right LE as pt able to control step placement this session.       Modified Rankin (Stroke Patients Only) Modified Rankin (Stroke Patients Only) Pre-Morbid Rankin Score: No symptoms Modified Rankin: Moderately severe disability     Balance   Sitting-balance support: Feet supported;No upper extremity supported Sitting balance-Leahy Scale: Good Sitting balance - Comments: Able to reach down and adjust socks, assisted with donning back gown. with bil LE's performed heel<>toe raises, then LAQs for 10 reps each.   Standing balance support: During functional activity Standing balance-Leahy Scale: Good Standing balance comment: standing next to bed with no UE support: alternating marching, then alternating forward/backward stepping with min guard to min assist. cues on posture and weight shifting.                    Cognition Arousal/Alertness: Awake/alert Behavior During Therapy: Anxious Overall Cognitive Status: No family/caregiver present to determine baseline cognitive functioning                General Comments: Pt continues to apologize throughout the session, requiring encouragment, and requiring increased assurance with all tasks. Difficulty remaining on topic at times with redirection needed. Emotionaly labile with session with laughing, anxiety and crying.       Pertinent Vitals/Pain Pain Assessment: No/denies pain     PT Goals (current goals can  now be found in the care plan section) Acute Rehab PT Goals Patient Stated Goal: to get better so he can work PT Goal Formulation: With patient Time For Goal  Achievement: 01/24/21 Potential to Achieve Goals: Good Progress towards PT goals: Progressing toward goals    Frequency    Min 4X/week      PT Plan Current plan remains appropriate    AM-PAC PT "6 Clicks" Mobility   Outcome Measure  Help needed turning from your back to your side while in a flat bed without using bedrails?: A Little Help needed moving from lying on your back to sitting on the side of a flat bed without using bedrails?: A Little Help needed moving to and from a bed to a chair (including a wheelchair)?: A Little Help needed standing up from a chair using your arms (e.g., wheelchair or bedside chair)?: A Little Help needed to walk in hospital room?: A Little Help needed climbing 3-5 steps with a railing? : A Little 6 Click Score: 18    End of Session Equipment Utilized During Treatment: Gait belt Activity Tolerance: Patient tolerated treatment well Patient left: in bed;with call bell/phone within reach;with bed alarm set Nurse Communication: Mobility status PT Visit Diagnosis: Unsteadiness on feet (R26.81);Muscle weakness (generalized) (M62.81);Ataxic gait (R26.0)     Time: 1044-1100 PT Time Calculation (min) (ACUTE ONLY): 16 min  Charges:  $Neuromuscular Re-education: 8-22 mins                     {Brailynn Breth Larae Grooms, CLT Acute Rehab Services [phone] 01/13/21, 11:29 AM    Sallyanne Kuster 01/13/2021, 11:23 AM

## 2021-01-14 ENCOUNTER — Inpatient Hospital Stay (HOSPITAL_COMMUNITY)
Admission: RE | Admit: 2021-01-14 | Discharge: 2021-01-20 | DRG: 057 | Disposition: A | Discharge status: Home Health | Payer: BC Managed Care – PPO | Source: Intra-hospital | Attending: Physical Medicine & Rehabilitation | Admitting: Physical Medicine & Rehabilitation

## 2021-01-14 ENCOUNTER — Encounter (HOSPITAL_COMMUNITY): Payer: Self-pay | Admitting: Physical Medicine & Rehabilitation

## 2021-01-14 DIAGNOSIS — E785 Hyperlipidemia, unspecified: Secondary | ICD-10-CM | POA: Diagnosis present

## 2021-01-14 DIAGNOSIS — R001 Bradycardia, unspecified: Secondary | ICD-10-CM | POA: Diagnosis present

## 2021-01-14 DIAGNOSIS — I5022 Chronic systolic (congestive) heart failure: Secondary | ICD-10-CM | POA: Diagnosis present

## 2021-01-14 DIAGNOSIS — Z6841 Body Mass Index (BMI) 40.0 and over, adult: Secondary | ICD-10-CM | POA: Diagnosis not present

## 2021-01-14 DIAGNOSIS — I252 Old myocardial infarction: Secondary | ICD-10-CM

## 2021-01-14 DIAGNOSIS — I69319 Unspecified symptoms and signs involving cognitive functions following cerebral infarction: Secondary | ICD-10-CM | POA: Diagnosis not present

## 2021-01-14 DIAGNOSIS — I110 Hypertensive heart disease with heart failure: Secondary | ICD-10-CM | POA: Diagnosis present

## 2021-01-14 DIAGNOSIS — Z951 Presence of aortocoronary bypass graft: Secondary | ICD-10-CM

## 2021-01-14 DIAGNOSIS — I69351 Hemiplegia and hemiparesis following cerebral infarction affecting right dominant side: Secondary | ICD-10-CM | POA: Diagnosis present

## 2021-01-14 DIAGNOSIS — Z7902 Long term (current) use of antithrombotics/antiplatelets: Secondary | ICD-10-CM

## 2021-01-14 DIAGNOSIS — I63532 Cerebral infarction due to unspecified occlusion or stenosis of left posterior cerebral artery: Secondary | ICD-10-CM | POA: Diagnosis not present

## 2021-01-14 DIAGNOSIS — Z809 Family history of malignant neoplasm, unspecified: Secondary | ICD-10-CM

## 2021-01-14 DIAGNOSIS — I69322 Dysarthria following cerebral infarction: Secondary | ICD-10-CM

## 2021-01-14 DIAGNOSIS — I2510 Atherosclerotic heart disease of native coronary artery without angina pectoris: Secondary | ICD-10-CM | POA: Diagnosis present

## 2021-01-14 DIAGNOSIS — Z7982 Long term (current) use of aspirin: Secondary | ICD-10-CM

## 2021-01-14 DIAGNOSIS — M19012 Primary osteoarthritis, left shoulder: Secondary | ICD-10-CM | POA: Diagnosis present

## 2021-01-14 DIAGNOSIS — I6312 Cerebral infarction due to embolism of basilar artery: Principal | ICD-10-CM

## 2021-01-14 DIAGNOSIS — Z8249 Family history of ischemic heart disease and other diseases of the circulatory system: Secondary | ICD-10-CM | POA: Diagnosis not present

## 2021-01-14 DIAGNOSIS — E669 Obesity, unspecified: Secondary | ICD-10-CM | POA: Diagnosis present

## 2021-01-14 DIAGNOSIS — Z79899 Other long term (current) drug therapy: Secondary | ICD-10-CM

## 2021-01-14 DIAGNOSIS — M19011 Primary osteoarthritis, right shoulder: Secondary | ICD-10-CM | POA: Diagnosis present

## 2021-01-14 DIAGNOSIS — I69393 Ataxia following cerebral infarction: Secondary | ICD-10-CM

## 2021-01-14 DIAGNOSIS — I6389 Other cerebral infarction: Secondary | ICD-10-CM

## 2021-01-14 DIAGNOSIS — I10 Essential (primary) hypertension: Secondary | ICD-10-CM | POA: Diagnosis present

## 2021-01-14 DIAGNOSIS — D751 Secondary polycythemia: Secondary | ICD-10-CM | POA: Diagnosis present

## 2021-01-14 DIAGNOSIS — R7989 Other specified abnormal findings of blood chemistry: Secondary | ICD-10-CM

## 2021-01-14 LAB — CBC
HCT: 49.6 % (ref 39.0–52.0)
Hemoglobin: 17.2 g/dL — ABNORMAL HIGH (ref 13.0–17.0)
MCH: 30.7 pg (ref 26.0–34.0)
MCHC: 34.7 g/dL (ref 30.0–36.0)
MCV: 88.6 fL (ref 80.0–100.0)
Platelets: 188 10*3/uL (ref 150–400)
RBC: 5.6 MIL/uL (ref 4.22–5.81)
RDW: 14 % (ref 11.5–15.5)
WBC: 9.2 10*3/uL (ref 4.0–10.5)
nRBC: 0 % (ref 0.0–0.2)

## 2021-01-14 LAB — BASIC METABOLIC PANEL
Anion gap: 9 (ref 5–15)
BUN: 25 mg/dL — ABNORMAL HIGH (ref 6–20)
CO2: 24 mmol/L (ref 22–32)
Calcium: 9 mg/dL (ref 8.9–10.3)
Chloride: 101 mmol/L (ref 98–111)
Creatinine, Ser: 1.09 mg/dL (ref 0.61–1.24)
GFR, Estimated: >60 mL/min (ref >60)
Glucose, Bld: 114 mg/dL — ABNORMAL HIGH (ref 70–99)
Potassium: 3.8 mmol/L (ref 3.5–5.1)
Sodium: 134 mmol/L — ABNORMAL LOW (ref 135–145)

## 2021-01-14 MED ORDER — BISACODYL 10 MG RE SUPP: 10.0000 mg | Freq: Every day | RECTAL | Status: DC | PRN

## 2021-01-14 MED ORDER — TICAGRELOR 90 MG PO TABS
90.0000 mg | ORAL_TABLET | Freq: Two times a day (BID) | ORAL | 0 refills | Status: DC
Start: 2021-01-14 — End: 2021-01-17

## 2021-01-14 MED ORDER — PROCHLORPERAZINE MALEATE 5 MG PO TABS: 5.0000 mg | ORAL_TABLET | Freq: Four times a day (QID) | ORAL | Status: DC | PRN

## 2021-01-14 MED ORDER — DIPHENHYDRAMINE HCL 12.5 MG/5ML PO ELIX: 12.5000 mg | ORAL_SOLUTION | Freq: Four times a day (QID) | ORAL | Status: DC | PRN

## 2021-01-14 MED ORDER — ATORVASTATIN CALCIUM 80 MG PO TABS: 80.0000 mg | ORAL_TABLET | Freq: Every day | ORAL | 0 refills | Status: DC

## 2021-01-14 MED ORDER — TRAZODONE HCL 50 MG PO TABS
25.0000 mg | ORAL_TABLET | Freq: Every evening | ORAL | Status: DC | PRN
  Administered 2021-01-14 – 2021-01-15 (×2): 50 mg via ORAL
  Filled 2021-01-14 (×2): qty 1

## 2021-01-14 MED ORDER — BLOOD PRESSURE CONTROL BOOK
Freq: Once | Status: AC
  Filled 2021-01-14: qty 1

## 2021-01-14 MED ORDER — ALUM & MAG HYDROXIDE-SIMETH 200-200-20 MG/5ML PO SUSP: 30.0000 mL | ORAL | Status: DC | PRN

## 2021-01-14 MED ORDER — HYDRALAZINE HCL 10 MG PO TABS
10.0000 mg | ORAL_TABLET | Freq: Three times a day (TID) | ORAL | Status: DC
  Administered 2021-01-14 – 2021-01-20 (×17): 10 mg via ORAL
  Filled 2021-01-14 (×17): qty 1

## 2021-01-14 MED ORDER — ACETAMINOPHEN 325 MG PO TABS
325.0000 mg | ORAL_TABLET | ORAL | Status: DC | PRN
  Administered 2021-01-15 – 2021-01-19 (×4): 650 mg via ORAL
  Filled 2021-01-14 (×4): qty 2

## 2021-01-14 MED ORDER — GUAIFENESIN-DM 100-10 MG/5ML PO SYRP: 5.0000 mL | ORAL_SOLUTION | Freq: Four times a day (QID) | ORAL | Status: DC | PRN

## 2021-01-14 MED ORDER — ASPIRIN 81 MG PO CHEW
81.0000 mg | CHEWABLE_TABLET | Freq: Every day | ORAL | Status: DC
  Administered 2021-01-15 – 2021-01-20 (×6): 81 mg via ORAL
  Filled 2021-01-14 (×6): qty 1

## 2021-01-14 MED ORDER — PROCHLORPERAZINE 25 MG RE SUPP: 12.5000 mg | Freq: Four times a day (QID) | RECTAL | Status: DC | PRN

## 2021-01-14 MED ORDER — SPIRONOLACTONE 25 MG PO TABS
25.0000 mg | ORAL_TABLET | Freq: Every day | ORAL | Status: DC
  Administered 2021-01-15 – 2021-01-20 (×6): 25 mg via ORAL
  Filled 2021-01-14 (×6): qty 1

## 2021-01-14 MED ORDER — FUROSEMIDE 40 MG PO TABS
40.0000 mg | ORAL_TABLET | Freq: Every day | ORAL | Status: DC
  Administered 2021-01-15 – 2021-01-20 (×6): 40 mg via ORAL
  Filled 2021-01-14 (×6): qty 1

## 2021-01-14 MED ORDER — ACETAMINOPHEN 325 MG PO TABS: 650.0000 mg | ORAL_TABLET | ORAL | 0 refills | Status: AC | PRN

## 2021-01-14 MED ORDER — ENOXAPARIN SODIUM 40 MG/0.4ML IJ SOSY
40.0000 mg | PREFILLED_SYRINGE | Freq: Every day | INTRAMUSCULAR | Status: DC
  Administered 2021-01-14 – 2021-01-19 (×6): 40 mg via SUBCUTANEOUS
  Filled 2021-01-14 (×6): qty 0.4

## 2021-01-14 MED ORDER — CARVEDILOL 3.125 MG PO TABS
3.1250 mg | ORAL_TABLET | Freq: Two times a day (BID) | ORAL | Status: DC
  Administered 2021-01-14 – 2021-01-20 (×12): 3.125 mg via ORAL
  Filled 2021-01-14 (×12): qty 1

## 2021-01-14 MED ORDER — CARVEDILOL 6.25 MG PO TABS: 6.2500 mg | ORAL_TABLET | Freq: Two times a day (BID) | ORAL | Status: DC

## 2021-01-14 MED ORDER — DOCUSATE SODIUM 100 MG PO CAPS: 100.0000 mg | ORAL_CAPSULE | Freq: Every day | ORAL | 0 refills | Status: DC

## 2021-01-14 MED ORDER — SPIRONOLACTONE 25 MG PO TABS: 25.0000 mg | ORAL_TABLET | Freq: Every day | ORAL | 0 refills | Status: DC

## 2021-01-14 MED ORDER — POLYETHYLENE GLYCOL 3350 17 G PO PACK
17.0000 g | PACK | Freq: Every day | ORAL | Status: DC | PRN
  Administered 2021-01-16: 17 g via ORAL
  Filled 2021-01-14: qty 1

## 2021-01-14 MED ORDER — ASPIRIN 81 MG PO CHEW: 81.0000 mg | CHEWABLE_TABLET | Freq: Every day | ORAL | 0 refills | Status: DC

## 2021-01-14 MED ORDER — ATORVASTATIN CALCIUM 80 MG PO TABS
80.0000 mg | ORAL_TABLET | Freq: Every day | ORAL | Status: DC
  Administered 2021-01-14 – 2021-01-19 (×6): 80 mg via ORAL
  Filled 2021-01-14 (×6): qty 1

## 2021-01-14 MED ORDER — FLEET ENEMA 7-19 GM/118ML RE ENEM: 1.0000 | ENEMA | Freq: Once | RECTAL | Status: DC | PRN

## 2021-01-14 MED ORDER — FUROSEMIDE 40 MG PO TABS: 40.0000 mg | ORAL_TABLET | Freq: Every day | ORAL | 0 refills | Status: DC

## 2021-01-14 MED ORDER — ENOXAPARIN SODIUM 40 MG/0.4ML IJ SOSY: 40.0000 mg | PREFILLED_SYRINGE | INTRAMUSCULAR | Status: DC

## 2021-01-14 MED ORDER — TICAGRELOR 90 MG PO TABS
90.0000 mg | ORAL_TABLET | Freq: Two times a day (BID) | ORAL | Status: DC
  Administered 2021-01-14 – 2021-01-20 (×12): 90 mg via ORAL
  Filled 2021-01-14 (×12): qty 1

## 2021-01-14 MED ORDER — CARVEDILOL 6.25 MG PO TABS
6.2500 mg | ORAL_TABLET | Freq: Two times a day (BID) | ORAL | 0 refills | Status: DC
Start: 2021-01-14 — End: 2021-01-20

## 2021-01-14 MED ORDER — PROCHLORPERAZINE EDISYLATE 10 MG/2ML IJ SOLN: 5.0000 mg | Freq: Four times a day (QID) | INTRAMUSCULAR | Status: DC | PRN

## 2021-01-14 MED ORDER — LIVING BETTER WITH HEART FAILURE BOOK: Freq: Once | Status: AC

## 2021-01-14 NOTE — Progress Notes (Signed)
Inpatient Rehabilitation Admission Medication Review by a Pharmacist  A complete drug regimen review was completed for this patient to identify any potential clinically significant medication issues.  High Risk Drug Classes Is patient taking? Indication by Medication  Antipsychotic Yes Compazine for nausea  Anticoagulant Yes LMWH for VTE prophx.  Antibiotic No   Opioid No   Antiplatelet Yes ASA, Brilinta s/p CVA  Hypoglycemics/insulin No Coreg for HTN, CAD, HF  Vasoactive Medication Yes   Chemotherapy No   Other No      Clinically significant medication issues were identified that warrant physician communication and completion of prescribed/recommended actions by midnight of the next day:  No  Name of provider notified for urgent issues identified: NA  Pharmacist comments: none  Time spent performing this drug regimen review (minutes):    Joshua [name], PharmD, BCPS Clinical Staff Pharmacist [email or web address] Joshua [name] 01/14/2021 2:18 PM

## 2021-01-14 NOTE — Discharge Summary (Addendum)
Stroke Discharge Summary  Patient ID: Joshua [name]   MRN: [id number]      DOB: 08/19/1960  Date of Admission: 01/09/2021 Date of Discharge: 01/14/2021  Attending Physician:  Stroke, Md, MD, Stroke MD Patient's PCP:  Tommie Sams, DO  Discharge Diagnoses: Stroke:  posterior circulation infarct due to b/l VA and BA thrombosis and occlusion, secondary to large vessel disease source.  S/p angioplasty stenting  Principal Problem:   Embolic stroke involving basilar artery (HCC) Active Problems:   Essential hypertension, benign   Obesity, unspecified   Coronary artery disease   Chronic combined systolic and diastolic heart failure (HCC)   Basilar artery occlusion   Hyperlipidemia   Medications to be continued on Rehab Allergies as of 01/14/2021   No Known Allergies      Medication List     STOP taking these medications    aspirin 81 MG tablet Replaced by: aspirin 81 MG chewable tablet   Entresto 97-103 MG Generic drug: sacubitril-valsartan   ibuprofen 200 MG tablet Commonly known as: ADVIL       TAKE these medications    acetaminophen 325 MG tablet Commonly known as: TYLENOL Take 2 tablets (650 mg total) by mouth every 4 (four) hours as needed for mild pain (or temp > 37.5 C (99.5 F)).   aspirin 81 MG chewable tablet Chew 1 tablet (81 mg total) by mouth daily. Replaces: aspirin 81 MG tablet   atorvastatin 80 MG tablet Commonly known as: LIPITOR Take 1 tablet (80 mg total) by mouth daily. What changed:  medication strength how much to take how to take this when to take this additional instructions   carvedilol 6.25 MG tablet Commonly known as: COREG Take 1 tablet (6.25 mg total) by mouth 2 (two) times daily with a meal.   docusate sodium 100 MG capsule Commonly known as: COLACE Take 1 capsule (100 mg total) by mouth daily.   furosemide 40 MG tablet Commonly known as: LASIX Take 1 tablet (40 mg total) by mouth daily. What changed:  how much to  take how to take this when to take this additional instructions   spironolactone 25 MG tablet Commonly known as: ALDACTONE Take 1 tablet (25 mg total) by mouth daily. What changed: how much to take   ticagrelor 90 MG Tabs tablet Commonly known as: BRILINTA Take 1 tablet (90 mg total) by mouth 2 (two) times daily.        LABORATORY STUDIES CBC    Component Value Date/Time   WBC 9.2 01/14/2021 0351   RBC 5.60 01/14/2021 0351   HGB 17.2 (H) 01/14/2021 0351   HGB 15.9 04/07/2013 0642   HCT 49.6 01/14/2021 0351   HCT 47.1 04/07/2013 0642   PLT 188 01/14/2021 0351   PLT 228 04/07/2013 0642   MCV 88.6 01/14/2021 0351   MCV 84 04/07/2013 0642   MCH 30.7 01/14/2021 0351   MCHC 34.7 01/14/2021 0351   RDW 14.0 01/14/2021 0351   RDW 13.7 04/07/2013 0642   LYMPHSABS 1.4 01/10/2021 0449   LYMPHSABS 1.6 04/07/2013 0642   MONOABS 0.8 01/10/2021 0449   MONOABS 0.6 04/07/2013 0642   EOSABS 0.0 01/10/2021 0449   EOSABS 0.0 04/07/2013 0642   BASOSABS 0.0 01/10/2021 0449   BASOSABS 0.1 04/07/2013 0642   CMP    Component Value Date/Time   NA 134 (L) 01/14/2021 0351   NA 139 09/26/2016 1135   NA 137 04/07/2013 0136   K 3.8 01/14/2021 0351  K 3.8 04/07/2013 0136   CL 101 01/14/2021 0351   CL 105 04/07/2013 0136   CO2 24 01/14/2021 0351   CO2 28 04/07/2013 0136   GLUCOSE 114 (H) 01/14/2021 0351   GLUCOSE 133 (H) 04/07/2013 0136   BUN 25 (H) 01/14/2021 0351   BUN 29 (H) 09/26/2016 1135   BUN 18 04/07/2013 0136   CREATININE 1.09 01/14/2021 0351   CREATININE 1.17 04/07/2013 0136   CALCIUM 9.0 01/14/2021 0351   CALCIUM 9.3 04/07/2013 0136   PROT 8.3 (H) 01/09/2021 0240   PROT 8.0 04/07/2013 0136   ALBUMIN 4.9 01/09/2021 0240   ALBUMIN 4.2 04/07/2013 0136   AST 20 01/09/2021 0240   AST 36 04/07/2013 0136   ALT 20 01/09/2021 0240   ALT 31 04/07/2013 0136   ALKPHOS 54 01/09/2021 0240   ALKPHOS 75 04/07/2013 0136   BILITOT 1.3 (H) 01/09/2021 0240   BILITOT 0.6 04/07/2013  0136   GFRNONAA >60 01/14/2021 0351   GFRNONAA >60 04/07/2013 0136   GFRAA >60 12/18/2016 1223   GFRAA >60 04/07/2013 0136   COAGS Lab Results  Component Value Date   INR 1.1 01/09/2021   INR 1.23 07/25/2016   INR 1.36 04/09/2013   Lipid Panel    Component Value Date/Time   CHOL 194 01/10/2021 0449   TRIG 205 (H) 01/10/2021 0449   HDL 30 (L) 01/10/2021 0449   CHOLHDL 6.5 01/10/2021 0449   VLDL 41 (H) 01/10/2021 0449   LDLCALC 123 (H) 01/10/2021 0449   HgbA1C  Lab Results  Component Value Date   HGBA1C 5.3 01/10/2021   Urinalysis    Component Value Date/Time   COLORURINE YELLOW (A) 01/09/2021 0515   APPEARANCEUR CLEAR (A) 01/09/2021 0515   LABSPEC 1.044 (H) 01/09/2021 0515   PHURINE 5.0 01/09/2021 0515   GLUCOSEU NEGATIVE 01/09/2021 0515   HGBUR NEGATIVE 01/09/2021 0515   BILIRUBINUR NEGATIVE 01/09/2021 0515   KETONESUR 5 (A) 01/09/2021 0515   PROTEINUR 30 (A) 01/09/2021 0515   UROBILINOGEN 0.2 04/07/2013 2211   NITRITE NEGATIVE 01/09/2021 0515   LEUKOCYTESUR NEGATIVE 01/09/2021 0515   Urine Drug Screen     Component Value Date/Time   LABOPIA NONE DETECTED 01/09/2021 0515   COCAINSCRNUR NONE DETECTED 01/09/2021 0515   LABBENZ NONE DETECTED 01/09/2021 0515   AMPHETMU NONE DETECTED 01/09/2021 0515   THCU NONE DETECTED 01/09/2021 0515   LABBARB NONE DETECTED 01/09/2021 0515    Alcohol Level    Component Value Date/Time   ETH <10 01/09/2021 0240     SIGNIFICANT DIAGNOSTIC STUDIES CT HEAD WO CONTRAST  Result Date: 01/09/2021 CLINICAL DATA:  Stroke follow-up. Status post basilar artery thrombectomy. EXAM: CT HEAD WITHOUT CONTRAST TECHNIQUE: Contiguous axial images were obtained from the base of the skull through the vertex without intravenous contrast. COMPARISON:  Head CT earlier today FINDINGS: Brain: There is new mild asymmetric hypodensity in the right cerebellar hemisphere suspicious for an acute infarct. No acute supratentorial infarct, intracranial  hemorrhage, midline shift, or extra-axial fluid collection is identified. Hypodensities in the cerebral white matter bilaterally are unchanged and nonspecific but compatible with mild chronic small vessel ischemic disease. The ventricles and sulci are normal. Vascular: Calcified atherosclerosis at the skull base. New stent in the distal right vertebral artery. Residual intravascular contrast material. Skull: No fracture or suspicious osseous lesion. Sinuses/Orbits: Mild mucosal thickening in the paranasal sinuses. Clear mastoid air cells. Unremarkable orbits. Other: None. IMPRESSION: 1. Suspected acute right cerebellar infarct. 2. No intracranial hemorrhage. Electronically Signed  By: Sebastian Ache M.D.   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 Status post endovascular revascularization of basilar artery and right vertebrobasilar junction. Stented right vertebrobasilar junction. Right cerebellar infarcts suspected on postprocedure CT. EXAM: MRA HEAD WITHOUT CONTRAST TECHNIQUE: Angiographic images of the Circle of Willis were acquired using MRA technique without intravenous contrast. COMPARISON:  MRI this morning reported separately. CTA head and neck yesterday. FINDINGS: Susceptibility artifact in the visible distal right vertebral artery, with preserved flow signal in the distal aspect of the stent. Preserved flow signal at the bilateral vertebrobasilar junction, however, that on the left might be retrograde. Patent basilar artery with mild irregularity, no stenosis. Bilateral AICA, SCA, and PCA origins are patent. Posterior communicating arteries are diminutive or absent. Bilateral PCA branches are patent, mildly irregular in the left P2 segment. Antegrade flow in both ICA siphons. Mild siphon irregularity, no significant  siphon stenosis. Patent carotid termini, MCA and ACA origins. Mildly dominant A1 as seen yesterday. Anterior communicating artery, visible ACA branches, right MCA M1 and visible right MCA branches are within normal limits. There is a mild left MCA mid M1 segment irregularity and stenosis which is more apparent than on the CTA yesterday (series 1007, image 10). But the left MCA bifurcation is patent without stenosis. Visible left MCA branches are within normal limits. IMPRESSION: 1. Patent Basilar Artery without stenosis. Patent bilateral AICA, SCA, and PCA origins. 2. Patent vertebrobasilar junction, but the distal vertebral arteries are minimally included on this exam and there is stent artifact on the right. 3. Anterior circulation remarkable for mild bilateral ICA siphon and Left MCA M1 atherosclerosis. Electronically Signed   By: Odessa Fleming M.D.   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 Status post endovascular revascularization of basilar artery and right vertebrobasilar junction. Stented right vertebrobasilar junction. Right cerebellar infarcts suspected on postprocedure CT. EXAM: MRI HEAD WITHOUT CONTRAST TECHNIQUE: Multiplanar, multiecho pulse sequences of the brain and surrounding structures were obtained without intravenous contrast. COMPARISON:  Head CT 14 10 and 0226 hours yesterday. FINDINGS: Brain: Confluent right superior cerebellar artery territory infarct with restricted diffusion (series 7, image 59). But numerous scattered small additional bilateral cerebellar hemisphere infarcts. Restricted diffusion at the right lateral medulla also on series 5, image 66. And there are several small foci of diffusion restriction also in the right midbrain near the midline (series 5, image 77). Several punctate infarcts in the right  thalamus including ventrally on series 5, image 84. Similar scattered small infarcts in the bilateral occipital lobes and occasionally the right parietal lobe. And there are perhaps 1 or 2 punctate infarcts also in the anterior circulation (left middle frontal gyrus series 5, image 101). No evidence of hemorrhagic transformation. Cytotoxic edema at the affected areas, most pronounced in the right superior cerebellum. Mild mass effect on the 4th ventricle, but no ventriculomegaly or other significant intracranial mass effect. Superimposed patchy and scattered chronic cerebral white matter T2 and FLAIR hyperintensity, most pronounced in the periatrial regions. Deep gray matter nuclei are within normal limits. There is a chronic microhemorrhage in the anterior right frontal lobe white matter series 14, image 32. No evidence of mass lesion, extra-axial collection  or acute intracranial hemorrhage. Cervicomedullary junction and pituitary are within normal limits. Vascular: Evidence of continued absent flow in the distal left vertebral artery. But other major intracranial vascular flow voids are preserved, see also MRA today reported separately. Skull and upper cervical spine: Negative visible cervical spine. Visualized bone marrow signal is within normal limits. Sinuses/Orbits: Negative. Other: Trace mastoid air cell fluid on the right. Visible internal auditory structures appear normal. Negative visible scalp and face. IMPRESSION: 1. Confluent right cerebellar SCA territory infarct with numerous other small infarcts scattered throughout the bilateral posterior circulation, including: Right lateral medulla, bilateral cerebellar hemispheres, right midbrain, right thalamus, bilateral occipital lobes. Occasional punctate anterior circulation infarcts. 2. No associated hemorrhage. Mild mass effect on the 4th ventricle but no ventriculomegaly. No midline shift and normal basilar cisterns. 3. Moderate underlying chronic  cerebral white matter disease. Solitary chronic microhemorrhage in the right frontal lobe white matter. 4. MRA today reported separately. Electronically Signed   By: Odessa Fleming M.D.   On: 01/10/2021 05:06   IR US Guide Vasc Access Right  Result Date: 01/11/2021 INDICATION: Speech difficulty, gait instability and bilateral lower extremity weakness. 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 EXAM: 1. EMERGENT LARGE VESSEL OCCLUSION THROMBOLYSIS (POSTERIOR CIRCULATION) COMPARISON:  CT angiogram of the head and neck 01/09/2021. MEDICATIONS: Ancef 2 g IV antibiotic was administered within 1 hour of the procedure. ANESTHESIA/SEDATION: General anesthesia CONTRAST:  Omnipaque 300 approximately 75 cc. FLUOROSCOPY TIME:  Fluoroscopy Time: 92 minutes 40 seconds (7422 mGy). COMPLICATIONS: None immediate. TECHNIQUE: Patient's neurological status and imaging findings were reviewed and discussed with Dr. Thomasena Edis, on-call neuro hospitalist. 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 Reasons were then made for patient's transfer to [facility] for endovascular treatment of the occluded basilar artery. Informed witness consent was not obtainable as the patient was intubated and under general anesthesia. No family members or next of kin were available via telephone or physically. Given the neuroimaging findings, and patient's clinical condition, an emergent consent was initiated. The patient was then put under general anesthesia by the Department of Anesthesiology at [facility]. The right groin was prepped and draped in the usual sterile fashion. Thereafter using modified Seldinger technique, transfemoral access into the right common femoral artery was obtained without difficulty. Over a 0.035 inch guidewire a 5 [place] Pinnacle 25 cm sheath was inserted. Through this, and also over a 0.035  inch guidewire a 5 [place] JB 1 catheter was advanced to the aortic arch region and selectively positioned in the left common carotid artery, the left vertebral artery, the right common carotid artery and the right vertebral artery. FINDINGS: Non dominant left vertebral artery demonstrates mild stenosis at its origin. The vessel demonstrates slow ascent of contrast to the cranial skull base to complete occlusion at the level of C2. Partial opacification is noted of the left vertebrobasilar junction just distal to the origin of the left posterior-inferior cerebellar artery, the posterior spinal artery. Incomplete opacification is seen of the left posterior-inferior cerebellar artery. 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 On the AP projection there is central prominent vessel which projects superiorly and in the midline opacifying the confluence of the vertebrobasilar junctions and retrogradely the right vertebrobasilar junction with partial opacification of the proximal right posteroinferior cerebellar artery. No clearance of contrast is noted distally. The left  common carotid arteriogram demonstrates patency of the left external carotid artery and its major branches. The left internal carotid artery at the bulb to the cranial skull base is widely patent. The petrous, the cavernous and the supraclinoid segments are widely patent. The left middle cerebral artery demonstrates a 50% stenosis in the mid M1 segment. More distally the trifurcation branches are widely patent. The left anterior cerebral artery opacifies into the capillary and venous phases. The innominate arteriogram demonstrates patency of the proximal right subclavian artery with moderate tortuosity. The origin of the right vertebral artery is noted with ascent of contrast to the cranial skull base. Right common carotid arteriogram demonstrates the right external  carotid artery and its major branches to be widely patent. The right internal carotid artery at the bulb to the cranial skull base is widely patent. The intracranial portion was not examined. PROCEDURE: The 5 French diagnostic catheter was then advanced over a 0.035 inch Roadrunner guidewire to the distal right subclavian artery and exchanged over a stiff Glidewire for a 95 cm Neuron Max sheath inside of which was a 115 cm 5 [place] Catalyst guide catheter. The combination was then advanced and positioned just distal to the origin of the right vertebral artery. The exchange wire was removed. Combination of the 5 [place] Catalyst guide catheter and the Neuron Max sheath was then retrieved proximal to the origin of the right vertebral artery. Multiple attempts were then made using 035 inch guidewires, 016 micro guidewire with 021 microcatheter, and 014 inch micro guidewire with the 021 micro catheters without success. A radial access was then obtained using ultrasound guidance. Initially the morphology of the right radial artery was identified with ultrasound and documented. A dorsal palmar anastomosis was verified to be present. Over a 0.018 inch micro guidewire and a micropuncture set, access was obtained with ultrasound guidance with placement of a 6/7 French radial sheath. The micro guidewire and the obturator were removed. Good aspiration obtained from the side port of the radial sheath. A cocktail of 2000 units of heparin, and 2.5 mg of verapamil, and 200 mcg of nitroglycerin was then infused without event. Over a 0.035 inch Roadrunner guidewire, a 5 [place] JB 1 catheter, JB 2 catheter and a JB 3 catheter were advanced and multiple attempts were made to cannulate the right vertebral artery without success. This approach was then abandoned. Another attempt was then made through the right common femoral artery puncture site. A 5 [place] JB 3 diagnostic catheter was then advanced over a 0.035 inch glidewire to the  distal right subclavian artery. The guidewire was then gently retrieved proximally with the microcatheter retrieved until it engaged the origin of the right vertebral artery. A 0.035 inch Roadrunner guidewire was then advanced more distally into the right vertebral artery followed by the 5 [place] JB 3 catheter. Good aspiration obtained from the hub of the diagnostic catheter following removal of the guidewire. A control arteriogram performed through the diagnostic catheter demonstrated slow ascent contrast to the vertebrobasilar junction with complete occlusion of the dominant right vertebral artery at the level of C1. Over a 300 cm exchange 035 inch guidewire, combination of the Catalyst 115 cm guide catheter inside of an 8 [place] Neuron Max sheath was advanced to the distal right vertebral artery. The guidewire was removed. Good aspiration obtained from the hub of the 5 [place] Catalyst guide catheter. A gentle contrast injection continued to demonstrate occluded dominant right vertebral artery at the level of C1. At this time a  combination of an 021 162 cm microcatheter and 014 inch Synchro standard micro guidewire with a moderate J configuration was advanced to the distal end of the 5 [place] Catalyst guide catheter. Using a torque device, gentle advancement was performed of the micro guidewire followed by the microcatheter to the site of the occluded dominant right vertebrobasilar junction proximal to the dominant right posteroinferior cerebellar artery and distally just proximal to the basilar artery. Eventually access with the micro guidewire was obtained followed by the microcatheter. The micro guidewire was then advanced to the P2 region of the left posterior cerebral artery followed by the microcatheter. The guidewire was removed. Good aspiration obtained from the hub of the catheter. Gentle control arteriogram demonstrated safe position of the tip of the microcatheter. This was then connected to continuous  heparinized saline infusion. A 4 mm x 40 mm Solitaire X retrieval device was then advanced to the distal end of the microcatheter. The O ring on the delivery microcatheter was loosened and the delivery microcatheter was retrieved deploying the retrieval device. The 5 [place] Catalyst guide catheter was now advanced to the occluded segment of the dominant right vertebral artery proximal to the basilar artery. Thereafter constant aspiration was applied with a Penumbra aspiration device at the hub of the Catalyst guide catheter and with a 20 mL syringe at the hub of the Neuron Max sheath for approximately 3 minutes. The combination of the retrieval device, the microcatheter, and the 5 [place] Catalyst guide catheter was retrieved and removed. Following removal, there was free aspiration of blood at the hub of the Neuron Max sheath at the level of the skull base. A control arteriogram performed now demonstrated revascularization of the right vertebrobasilar junction,the basilar artery, the posterior cerebral arteries, the superior cerebellar arteries and the left anterior inferior cerebellar artery. Also there was opacification of the right posterior-inferior cerebellar artery and retrograde flow into the left vertebrobasilar junction. Moderate amount of thrombus was noted entangled in the struts of the retrieval device. Moderate spasm was noted in the mid basilar artery,the proximal basilar artery and the right vertebrobasilar junction. This responded to 2 aliquots of nitroglycerin intra-arterially. Severe intracranial stenosis was noted at two sites at the right vertebrobasilar junction, one distal and one proximal to the right posterior-inferior cerebellar artery. Measurements were then performed of the distal right vertebrobasilar junction and the proximal right vertebrobasilar junction. It was decided to proceed with placement of a 3 mm x 22 mm Resolute Onyx stent. Over a 0.014 inch standard Synchro micro guidewire,  an 021 160 cm microcatheter was advanced coaxially with a 5 [place] Catalyst guide catheter to the distal end of the Neuron Max sheath in the right vertebral artery. Two areas of high-grade stenosis were then crossed with the micro guidewire followed by the microcatheter which was advanced to the distal basilar artery. The micro guidewire was removed. Good aspiration obtained from the hub of the microcatheter. This in turn was replaced with a 300 cm 014 inch Zoom exchange micro guidewire. The distal end of the Zoom guidewire had a mild J configuration. The 3 mm x 22 mm Resolute Onyx balloon mounted stent was then retrogradely purged with heparinized saline infusion, and antegradely with 50% contrast and 50% heparinized saline infusion. Using the rapid exchange technique, the balloon mountable stent delivery apparatus was advanced without difficulty and positioned adequate distance from the distal stenosis and proximal stenosis. This was then deployed with a micro inflation syringe device via micro tubing to 12 atmospheres achieving  a 3.05 mm diameter. The balloon was deflated and retrieved and removed. A control arteriogram performed through the Catalyst guide catheter demonstrated significantly improved caliber and flow through the stented segment at the site of the proximal stenosis. Suboptimal coverage was noted of the distal stenosis. This necessitated the placement of a second Resolute Onyx stent 3 mm x 15 mm. Having been prepped as described above, the stent apparatus was advanced and positioned such that the distal portion of the second stent was just proximal to the basilar artery. Thereafter, this was then deployed in the usual manner using micro inflation syringe device via micro tubing to over 3 mm at approximately 11 atmospheres. The balloon was deflated and retrieved and removed. A control arteriogram performed through the Catalyst guide catheter in the proximal right vertebrobasilar junction now  demonstrated excellent flow through the right vertebral artery, the right vertebrobasilar junction, the posterior cerebral arteries, the superior cerebellar arteries and the left anterior inferior cerebellar artery. No significant flow was noted in the right anterior inferior cerebellar artery. There was retrograde flow into the left vertebrobasilar junction. Control arteriograms were then performed at approximally 10 and 25 minutes after placement of a second stent. These continued to demonstrate excellent flow without evidence of intraluminal filling defects. A TICI 2C revascularization of the vertebrobasilar system was noted as the right anterior inferior cerebellar artery was now angiographically verified. The 5 [place] Catalyst guide catheter was removed. The Neuron Max sheath was then retrieved into the proximal right vertebral artery. A final control arteriogram performed through this demonstrated wide patency of the right vertebral artery intracranially and extracranially with no change in the revascularized posterior fossa. The 8 French Pinnacle sheath was removed. Manual compression was applied for hemostasis in the right groin puncture site. Distal pulses remained Dopplerable in both feet unchanged. A CT scan of the brain obtained prior to the placement of the intracranial stents, and after continued to demonstrate no evidence of mass effect, midline shift or of intracranial hemorrhage. Also, the patient was loaded with 180 mg of Brilinta orally via an OG tube prior to placement of the intracranial stents. Also right prior to placement of the stent, the patient was given a half loading dose of Cangrelor IV followed by a 4 hour infusion. CT of the brain was to be obtained at 4 hours of the Cangrelor infusion. The patient's general anesthesia was reversed. Patient was extubated. Upon recovery, the patient was able to obey simple commands appropriately. Denied any headaches or nausea. The patient was able to  move all four extremities. His range of movements was within normal limits. He was then transferred to the neuro ICU for post revascularization management. 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 PLAN: Follow-up in clinic 2 weeks post discharge. Electronically Signed   By: Julieanne Cotton M.D.   On: 01/11/2021 08:35   DG Chest Port 1 View  Result Date: 01/09/2021 CLINICAL DATA:  Increased weakness and slurred speech EXAM: PORTABLE CHEST 1 VIEW COMPARISON:  07/21/2016 FINDINGS: Cardiac shadow is enlarged. Postsurgical changes are again noted and stable. Lungs are clear bilaterally. No bony abnormality is seen. IMPRESSION: Stable cardiomegaly.  No acute abnormality noted. Electronically Signed  By: Alcide Clever M.D.   On: 01/09/2021 02:57   ECHOCARDIOGRAM COMPLETE  Result Date: 01/10/2021    ECHOCARDIOGRAM REPORT   Patient Name:   ARNALDO HEFFRON Date of Exam: 01/10/2021 Medical Rec #:  [id number]   Height:       72.0 in Accession #:    [id number]  Weight:       263.0 lb Date of Birth:  June 22, 1960   BSA:          2.394 m Patient Age:    60 years    BP:           129/97 mmHg Patient Gender: M           HR:           78 bpm. Exam Location:  Inpatient Procedure: 2D Echo, Cardiac Doppler and Color Doppler Indications:     Stroke  History:         Patient has prior history of Echocardiogram examinations, most                  recent 07/21/2016. Risk Factors:Hypertension, Family History of                  Coronary Artery Disease and Former Smoker.  Sonographer:     Alvera Novel Referring Phys:  [id number] HUDJSHF XU Diagnosing Phys:  Thomasene Ripple DO IMPRESSIONS  1. Left ventricular ejection fraction, by estimation, is 35 to 40%. The left ventricle has moderately decreased function. The left ventricle demonstrates global hypokinesis. There is severe concentric left ventricular hypertrophy. Left ventricular diastolic parameters are consistent with Grade I diastolic dysfunction (impaired relaxation).  2. Right ventricular systolic function is normal. The right ventricular size is normal.  3. The mitral valve is normal in structure. Trivial mitral valve regurgitation. No evidence of mitral stenosis.  4. The aortic valve is normal in structure. Aortic valve regurgitation is not visualized. Mild to moderate aortic valve sclerosis/calcification is present, without any evidence of aortic stenosis.  5. Aortic annular calcification. Aortic dilatation noted.  6. The inferior vena cava is normal in size with greater than 50% respiratory variability, suggesting right atrial pressure of 3 mmHg. FINDINGS  Left Ventricle: Left ventricular ejection fraction, by estimation, is 35 to 40%. The left ventricle has moderately decreased function. The left ventricle demonstrates global hypokinesis. Definity contrast agent was given IV to delineate the left ventricular endocardial borders. The left ventricular internal cavity size was normal in size. There is severe concentric left ventricular hypertrophy. Left ventricular diastolic parameters are consistent with Grade I diastolic dysfunction (impaired relaxation). Right Ventricle: The right ventricular size is normal. No increase in right ventricular wall thickness. Right ventricular systolic function is normal. Left Atrium: Left atrial size was normal in size. Right Atrium: Right atrial size was normal in size. Pericardium: There is no evidence of pericardial effusion. Presence of pericardial fat pad. Mitral Valve: The mitral valve is normal in structure. Mild mitral annular calcification. Trivial mitral valve  regurgitation. No evidence of mitral valve stenosis. Tricuspid Valve: The tricuspid valve is normal in structure. Tricuspid valve regurgitation is not demonstrated. No evidence of tricuspid stenosis. Aortic Valve: The aortic valve is normal in structure. Aortic valve regurgitation is not visualized. Mild to moderate aortic valve sclerosis/calcification is present, without any evidence of aortic stenosis. Pulmonic Valve: The pulmonic valve was not well visualized. Pulmonic valve regurgitation is not visualized. No evidence of pulmonic stenosis. Aorta: Aortic annular calcification. Aortic dilatation noted and the  aortic root is normal in size and structure. Venous: The inferior vena cava is normal in size with greater than 50% respiratory variability, suggesting right atrial pressure of 3 mmHg. IAS/Shunts: No atrial level shunt detected by color flow Doppler.  LEFT VENTRICLE PLAX 2D LVIDd:         5.35 cm      Diastology LVIDs:         4.60 cm      LV e' medial:    4.13 cm/s LV PW:         1.60 cm      LV E/e' medial:  16.8 LV IVS:        1.60 cm      LV e' lateral:   6.31 cm/s LVOT diam:     2.20 cm      LV E/e' lateral: 11.0 LV SV:         68 LV SV Index:   29 LVOT Area:     3.80 cm  LV Volumes (MOD) LV vol d, MOD A2C: 164.0 ml LV vol d, MOD A4C: 195.0 ml LV vol s, MOD A2C: 98.6 ml LV vol s, MOD A4C: 128.0 ml LV SV MOD A2C:     65.4 ml LV SV MOD A4C:     195.0 ml LV SV MOD BP:      70.4 ml RIGHT VENTRICLE             IVC RV S prime:     15.30 cm/s  IVC diam: 1.60 cm TAPSE (M-mode): 1.9 cm LEFT ATRIUM             Index        RIGHT ATRIUM           Index LA diam:        2.70 cm 1.13 cm/m   RA Area:     19.40 cm LA Vol (A2C):   35.4 ml 14.79 ml/m  RA Volume:   44.80 ml  18.72 ml/m LA Vol (A4C):   38.3 ml 16.00 ml/m LA Biplane Vol: 37.7 ml 15.75 ml/m  AORTIC VALVE LVOT Vmax:   114.00 cm/s LVOT Vmean:  70.600 cm/s LVOT VTI:    0.180 m  AORTA Ao Root diam: 3.70 cm Ao Asc diam:  3.40 cm MITRAL VALVE                TRICUSPID VALVE MV Area (PHT): 3.27 cm    TR Peak grad:   8.9 mmHg MV Decel Time: 232 msec    TR Vmax:        149.00 cm/s MV E velocity: 69.50 cm/s MV A velocity: 77.10 cm/s  SHUNTS MV E/A ratio:  0.90        Systemic VTI:  0.18 m                            Systemic Diam: 2.20 cm Kardie Tobb DO Electronically signed by Thomasene Ripple DO Signature Date/Time: 01/10/2021/12:47:06 PM    Final    IR PERCUTANEOUS ART THROMBECTOMY/INFUSION INTRACRANIAL INC DIAG ANGIO  Result Date: 01/11/2021 INDICATION: Speech difficulty, gait instability and bilateral lower extremity weakness. 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 EXAM: 1. EMERGENT LARGE VESSEL OCCLUSION THROMBOLYSIS (POSTERIOR CIRCULATION) COMPARISON:  CT angiogram of the head and neck 01/09/2021. MEDICATIONS: Ancef 2 g IV antibiotic was administered within 1 hour of the procedure. ANESTHESIA/SEDATION: General anesthesia CONTRAST:  Omnipaque 300 approximately 75 cc. FLUOROSCOPY TIME:  Fluoroscopy Time: 92 minutes 40 seconds (7422 mGy). COMPLICATIONS: None immediate. TECHNIQUE: Patient's neurological status and imaging findings were reviewed and discussed with Dr. Thomasena Edis, on-call neuro hospitalist. 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 Reasons were then made for patient's transfer to [facility] for endovascular treatment of the occluded basilar artery. Informed witness consent was not obtainable as the patient was intubated and under general anesthesia. No family members or next of kin were available via telephone or physically. Given the neuroimaging findings, and patient's clinical condition, an emergent consent was initiated. The patient was then put under general anesthesia by the Department of Anesthesiology at [facility]. The right groin was prepped and draped in the usual sterile fashion. Thereafter using  modified Seldinger technique, transfemoral access into the right common femoral artery was obtained without difficulty. Over a 0.035 inch guidewire a 5 [place] Pinnacle 25 cm sheath was inserted. Through this, and also over a 0.035 inch guidewire a 5 [place] JB 1 catheter was advanced to the aortic arch region and selectively positioned in the left common carotid artery, the left vertebral artery, the right common carotid artery and the right vertebral artery. FINDINGS: Non dominant left vertebral artery demonstrates mild stenosis at its origin. The vessel demonstrates slow ascent of contrast to the cranial skull base to complete occlusion at the level of C2. Partial opacification is noted of the left vertebrobasilar junction just distal to the origin of the left posterior-inferior cerebellar artery, the posterior spinal artery. Incomplete opacification is seen of the left posterior-inferior cerebellar artery. 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 On the AP projection there is central prominent vessel which projects superiorly and in the midline opacifying the confluence of the vertebrobasilar junctions and retrogradely the right vertebrobasilar junction with partial opacification of the proximal right posteroinferior cerebellar artery. No clearance of contrast is noted distally. The left common carotid arteriogram demonstrates patency of the left external carotid artery and its major branches. The left internal carotid artery at the bulb to the cranial skull base is widely patent. The petrous, the cavernous and the supraclinoid segments are widely patent. The left middle cerebral artery demonstrates a 50% stenosis in the mid M1 segment. More distally the trifurcation branches are widely patent. The left anterior cerebral artery opacifies into the capillary and venous phases. The innominate arteriogram demonstrates patency of  the proximal right subclavian artery with moderate tortuosity. The origin of the right vertebral artery is noted with ascent of contrast to the cranial skull base. Right common carotid arteriogram demonstrates the right external carotid artery and its major branches to be widely patent. The right internal carotid artery at the bulb to the cranial skull base is widely patent. The intracranial portion was not examined. PROCEDURE: The 5 French diagnostic catheter was then advanced over a 0.035 inch Roadrunner guidewire to the distal right subclavian artery and exchanged over a stiff Glidewire for a 95 cm Neuron Max sheath inside of which was a 115 cm 5 [place] Catalyst guide catheter. The combination was then advanced and positioned just distal to the origin of the right vertebral artery. The exchange wire was removed. Combination of the 5 [place] Catalyst guide catheter and the Neuron Max sheath was then retrieved proximal to the origin of the right vertebral artery. Multiple attempts were then  made using 035 inch guidewires, 016 micro guidewire with 021 microcatheter, and 014 inch micro guidewire with the 021 micro catheters without success. A radial access was then obtained using ultrasound guidance. Initially the morphology of the right radial artery was identified with ultrasound and documented. A dorsal palmar anastomosis was verified to be present. Over a 0.018 inch micro guidewire and a micropuncture set, access was obtained with ultrasound guidance with placement of a 6/7 French radial sheath. The micro guidewire and the obturator were removed. Good aspiration obtained from the side port of the radial sheath. A cocktail of 2000 units of heparin, and 2.5 mg of verapamil, and 200 mcg of nitroglycerin was then infused without event. Over a 0.035 inch Roadrunner guidewire, a 5 [place] JB 1 catheter, JB 2 catheter and a JB 3 catheter were advanced and multiple attempts were made to cannulate the right vertebral artery  without success. This approach was then abandoned. Another attempt was then made through the right common femoral artery puncture site. A 5 [place] JB 3 diagnostic catheter was then advanced over a 0.035 inch glidewire to the distal right subclavian artery. The guidewire was then gently retrieved proximally with the microcatheter retrieved until it engaged the origin of the right vertebral artery. A 0.035 inch Roadrunner guidewire was then advanced more distally into the right vertebral artery followed by the 5 [place] JB 3 catheter. Good aspiration obtained from the hub of the diagnostic catheter following removal of the guidewire. A control arteriogram performed through the diagnostic catheter demonstrated slow ascent contrast to the vertebrobasilar junction with complete occlusion of the dominant right vertebral artery at the level of C1. Over a 300 cm exchange 035 inch guidewire, combination of the Catalyst 115 cm guide catheter inside of an 8 [place] Neuron Max sheath was advanced to the distal right vertebral artery. The guidewire was removed. Good aspiration obtained from the hub of the 5 [place] Catalyst guide catheter. A gentle contrast injection continued to demonstrate occluded dominant right vertebral artery at the level of C1. At this time a combination of an 021 162 cm microcatheter and 014 inch Synchro standard micro guidewire with a moderate J configuration was advanced to the distal end of the 5 [place] Catalyst guide catheter. Using a torque device, gentle advancement was performed of the micro guidewire followed by the microcatheter to the site of the occluded dominant right vertebrobasilar junction proximal to the dominant right posteroinferior cerebellar artery and distally just proximal to the basilar artery. Eventually access with the micro guidewire was obtained followed by the microcatheter. The micro guidewire was then advanced to the P2 region of the left posterior cerebral artery followed by  the microcatheter. The guidewire was removed. Good aspiration obtained from the hub of the catheter. Gentle control arteriogram demonstrated safe position of the tip of the microcatheter. This was then connected to continuous heparinized saline infusion. A 4 mm x 40 mm Solitaire X retrieval device was then advanced to the distal end of the microcatheter. The O ring on the delivery microcatheter was loosened and the delivery microcatheter was retrieved deploying the retrieval device. The 5 [place] Catalyst guide catheter was now advanced to the occluded segment of the dominant right vertebral artery proximal to the basilar artery. Thereafter constant aspiration was applied with a Penumbra aspiration device at the hub of the Catalyst guide catheter and with a 20 mL syringe at the hub of the Neuron Max sheath for approximately 3 minutes. The combination of the retrieval device,  the microcatheter, and the 5 [place] Catalyst guide catheter was retrieved and removed. Following removal, there was free aspiration of blood at the hub of the Neuron Max sheath at the level of the skull base. A control arteriogram performed now demonstrated revascularization of the right vertebrobasilar junction,the basilar artery, the posterior cerebral arteries, the superior cerebellar arteries and the left anterior inferior cerebellar artery. Also there was opacification of the right posterior-inferior cerebellar artery and retrograde flow into the left vertebrobasilar junction. Moderate amount of thrombus was noted entangled in the struts of the retrieval device. Moderate spasm was noted in the mid basilar artery,the proximal basilar artery and the right vertebrobasilar junction. This responded to 2 aliquots of nitroglycerin intra-arterially. Severe intracranial stenosis was noted at two sites at the right vertebrobasilar junction, one distal and one proximal to the right posterior-inferior cerebellar artery. Measurements were then performed  of the distal right vertebrobasilar junction and the proximal right vertebrobasilar junction. It was decided to proceed with placement of a 3 mm x 22 mm Resolute Onyx stent. Over a 0.014 inch standard Synchro micro guidewire, an 021 160 cm microcatheter was advanced coaxially with a 5 [place] Catalyst guide catheter to the distal end of the Neuron Max sheath in the right vertebral artery. Two areas of high-grade stenosis were then crossed with the micro guidewire followed by the microcatheter which was advanced to the distal basilar artery. The micro guidewire was removed. Good aspiration obtained from the hub of the microcatheter. This in turn was replaced with a 300 cm 014 inch Zoom exchange micro guidewire. The distal end of the Zoom guidewire had a mild J configuration. The 3 mm x 22 mm Resolute Onyx balloon mounted stent was then retrogradely purged with heparinized saline infusion, and antegradely with 50% contrast and 50% heparinized saline infusion. Using the rapid exchange technique, the balloon mountable stent delivery apparatus was advanced without difficulty and positioned adequate distance from the distal stenosis and proximal stenosis. This was then deployed with a micro inflation syringe device via micro tubing to 12 atmospheres achieving a 3.05 mm diameter. The balloon was deflated and retrieved and removed. A control arteriogram performed through the Catalyst guide catheter demonstrated significantly improved caliber and flow through the stented segment at the site of the proximal stenosis. Suboptimal coverage was noted of the distal stenosis. This necessitated the placement of a second Resolute Onyx stent 3 mm x 15 mm. Having been prepped as described above, the stent apparatus was advanced and positioned such that the distal portion of the second stent was just proximal to the basilar artery. Thereafter, this was then deployed in the usual manner using micro inflation syringe device via micro  tubing to over 3 mm at approximately 11 atmospheres. The balloon was deflated and retrieved and removed. A control arteriogram performed through the Catalyst guide catheter in the proximal right vertebrobasilar junction now demonstrated excellent flow through the right vertebral artery, the right vertebrobasilar junction, the posterior cerebral arteries, the superior cerebellar arteries and the left anterior inferior cerebellar artery. No significant flow was noted in the right anterior inferior cerebellar artery. There was retrograde flow into the left vertebrobasilar junction. Control arteriograms were then performed at approximally 10 and 25 minutes after placement of a second stent. These continued to demonstrate excellent flow without evidence of intraluminal filling defects. A TICI 2C revascularization of the vertebrobasilar system was noted as the right anterior inferior cerebellar artery was now angiographically verified. The 5 [place] Catalyst guide catheter was removed.  The Neuron Max sheath was then retrieved into the proximal right vertebral artery. A final control arteriogram performed through this demonstrated wide patency of the right vertebral artery intracranially and extracranially with no change in the revascularized posterior fossa. The 8 French Pinnacle sheath was removed. Manual compression was applied for hemostasis in the right groin puncture site. Distal pulses remained Dopplerable in both feet unchanged. A CT scan of the brain obtained prior to the placement of the intracranial stents, and after continued to demonstrate no evidence of mass effect, midline shift or of intracranial hemorrhage. Also, the patient was loaded with 180 mg of Brilinta orally via an OG tube prior to placement of the intracranial stents. Also right prior to placement of the stent, the patient was given a half loading dose of Cangrelor IV followed by a 4 hour infusion. CT of the brain was to be obtained at 4 hours of  the Cangrelor infusion. The patient's general anesthesia was reversed. Patient was extubated. Upon recovery, the patient was able to obey simple commands appropriately. Denied any headaches or nausea. The patient was able to move all four extremities. His range of movements was within normal limits. He was then transferred to the neuro ICU for post revascularization management. 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 PLAN: Follow-up in clinic 2 weeks post discharge. Electronically Signed   By: Julieanne Cotton M.D.   On: 01/11/2021 08:35   CT HEAD CODE STROKE WO CONTRAST  Result Date: 01/09/2021 CLINICAL DATA:  Code stroke.  Neuro deficit, weakness EXAM: CT HEAD WITHOUT CONTRAST TECHNIQUE: Contiguous axial images were obtained from the base of the skull through the vertex without intravenous contrast. COMPARISON:  04/07/2013 FINDINGS: Brain: No evidence of acute infarction, hemorrhage, cerebral edema, mass, mass effect, or midline shift. Ventricles and sulci are normal for age. No extra-axial fluid collection. Periventricular white matter changes, likely the sequela of chronic small vessel ischemic disease. Vascular: No hyperdense vessel or unexpected calcification. Skull: Normal. Negative for fracture or focal lesion. Sinuses/Orbits: No acute finding. Mild mucosal thickening in the sinuses. Other: The mastoid air cells are well aerated. ASPECTS [facility] Stroke Program Early CT Score) - Ganglionic level infarction (caudate, lentiform nuclei, internal capsule, insula, M1-M3 cortex): 7 - Supraganglionic infarction (M4-M6  cortex): 3 Total score (0-10 with 10 being normal): 10 IMPRESSION: 1. No acute intracranial process. 2. ASPECTS is 10 Code stroke imaging results were communicated on 01/09/2021 at 2:38 am to provider Dr. Dolores Frame Via telephone, who verbally acknowledged these results. Electronically Signed   By: Wiliam Ke M.D.   On: 01/09/2021 02:40   CT ANGIO HEAD NECK W WO CM (CODE STROKE)  Addendum Date: 01/09/2021   ADDENDUM REPORT: 01/09/2021 05:01 ADDENDUM: Critical Value/emergent results were called by telephone at the time of interpretation on 01/09/2021 at 0437 hours to Dr. Lesly Rubenstein SUNG , who verbally acknowledged these results. Electronically Signed   By: Odessa Fleming M.D.   On: 01/09/2021 05:01   Result Date: 01/09/2021 CLINICAL DATA:  60 year old male code stroke presentation. Weakness. EXAM: CT ANGIOGRAPHY HEAD AND NECK TECHNIQUE: Multidetector CT imaging of the head and neck was performed using the standard protocol during bolus administration of intravenous contrast.  Multiplanar CT image reconstructions and MIPs were obtained to evaluate the vascular anatomy. Carotid stenosis measurements (when applicable) are obtained utilizing NASCET criteria, using the distal internal carotid diameter as the denominator. CONTRAST:  69mL OMNIPAQUE IOHEXOL 350 MG/ML SOLN COMPARISON:  Noncontrast head CT 0226 hours. FINDINGS: CTA NECK Skeleton: Prior sternotomy. Carious left posterior mandible dentition. Cervical spine degeneration. No acute osseous abnormality identified. Upper chest: Mediastinal lipomatosis. Prior CABG. Partially visible cardiomegaly. No evidence of pericardial effusion or mediastinal lymphadenopathy. Visible major airways appear patent, minor atelectasis. Other neck: Negative. Aortic arch: 3 vessel arch configuration. Minor arch atherosclerosis. Right carotid system: Mildly tortuous brachiocephalic artery and proximal right CCA without stenosis. Mild soft plaque proximal to the right carotid bifurcation without  stenosis. Partially retropharyngeal course of the bifurcation and right ICA. Mild partially calcified atherosclerosis without stenosis. Left carotid system: Negative left CCA origin. Mildly tortuous left CCA with no significant plaque or stenosis. Partially retropharyngeal left carotid bifurcation and ICA. Mild mostly soft plaque of the proximal ICA without stenosis. Mild tortuosity of the vessel in calcified plaque just below the skull base. Vertebral arteries: Mildly tortuous proximal right subclavian artery without stenosis. Normal right vertebral artery origin. Gradient of decreasing contrast opacification throughout the cervical right vertebral artery which is occluded in the V3 segment leading up to the skull base (series 7, image 161). Mild plaque in the proximal left subclavian artery without stenosis. Mild plaque at the left vertebral artery origin without stenosis. Asymmetrically better enhancement of the cervical left vertebral artery, however, the vessel is tapered and abruptly occluded at the distal V3 segment at the skull base on series 7, image 162. CTA HEAD Posterior circulation: Bilateral proximal vertebral artery V4 segments are occluded, but reconstituted at the PICA origins. Distal right V4 enhances somewhat better. Patent vertebrobasilar junction without stenosis. Patent proximal basilar artery. However, there is a 4-5 mm filling defect in the distal basilar extending to the level of the SCA origins compatible with acute thrombosis. See series 7, image 116 and series 8, image 123. Despite this the basilar tip and PCA origins remain patent. The right SCA is occluded. The left SCA remains patent. Posterior communicating arteries are diminutive or absent. Bilateral PCA branches are patent but irregular, with mild to moderate irregularity and stenosis which appears maximal at the right P3 bifurcation such as series 10, image 56. Anterior circulation: Both ICA siphons are patent. Bilateral siphon  atherosclerosis is mild without stenosis. Normal ophthalmic artery origins. Patent carotid termini. Patent MCA and ACA origins. Right A1 appears somewhat dominant. Normal anterior communicating artery. Bilateral ACA branches are within normal limits. Left MCA M1 segment and trifurcation are patent without stenosis. Right MCA M1 segment and bifurcation are patent without stenosis. Bilateral MCA branches are mildly irregular. No MCA occlusion is identified. Venous sinuses: Early contrast timing, grossly patent. Anatomic variants: Mildly dominant right A1. Review of the MIP images confirms the above findings IMPRESSION: 1. 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 Both vertebral arteries occlude at the skull base. And there is a a 4-5 mm segment of thrombus at the Distal Basilar at the level of the SCAs. The Right SCA origin is occluded. But the Basilar tip and PCAs remain patent. There is mild to moderate irregularity and stenosis of the distal PCAs greater on the right. 2. Bilateral carotid artery atherosclerosis without hemodynamically significant stenosis. Mild intracranial atherosclerosis. 3. Prior CABG.  Mild aortic arch atherosclerosis. Electronically Signed: By: Rexene Edison  Margo Aye M.D. On: 01/09/2021 04:33   IR ANGIO INTRA EXTRACRAN SEL COM CAROTID INNOMINATE UNI R MOD SED  Result Date: 01/11/2021 INDICATION: Speech difficulty, gait instability and bilateral lower extremity weakness. 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 EXAM: 1. EMERGENT LARGE VESSEL OCCLUSION THROMBOLYSIS (POSTERIOR CIRCULATION) COMPARISON:  CT angiogram of the head and neck 01/09/2021. MEDICATIONS: Ancef 2 g IV antibiotic was administered within 1 hour of the procedure. ANESTHESIA/SEDATION: General anesthesia CONTRAST:  Omnipaque 300 approximately 75 cc. FLUOROSCOPY TIME:  Fluoroscopy Time: 92 minutes 40 seconds (7422 mGy). COMPLICATIONS: None immediate.  TECHNIQUE: Patient's neurological status and imaging findings were reviewed and discussed with Dr. Thomasena Edis, on-call neuro hospitalist. 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 Reasons were then made for patient's transfer to [facility] for endovascular treatment of the occluded basilar artery. Informed witness consent was not obtainable as the patient was intubated and under general anesthesia. No family members or next of kin were available via telephone or physically. Given the neuroimaging findings, and patient's clinical condition, an emergent consent was initiated. The patient was then put under general anesthesia by the Department of Anesthesiology at [facility]. The right groin was prepped and draped in the usual sterile fashion. Thereafter using modified Seldinger technique, transfemoral access into the right common femoral artery was obtained without difficulty. Over a 0.035 inch guidewire a 5 [place] Pinnacle 25 cm sheath was inserted. Through this, and also over a 0.035 inch guidewire a 5 [place] JB 1 catheter was advanced to the aortic arch region and selectively positioned in the left common carotid artery, the left vertebral artery, the right common carotid artery and the right vertebral artery. FINDINGS: Non dominant left vertebral artery demonstrates mild stenosis at its origin. The vessel demonstrates slow ascent of contrast to the cranial skull base to complete occlusion at the level of C2. Partial opacification is noted of the left vertebrobasilar junction just distal to the origin of the left posterior-inferior cerebellar artery, the posterior spinal artery. Incomplete opacification is seen of the left posterior-inferior cerebellar artery. 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 On the  AP projection there is central prominent vessel which projects superiorly and in the midline opacifying the confluence of the vertebrobasilar junctions and retrogradely the right vertebrobasilar junction with partial opacification of the proximal right posteroinferior cerebellar artery. No clearance of contrast is noted distally. The left common carotid arteriogram demonstrates patency of the left external carotid artery and its major branches. The left internal carotid artery at the bulb to the cranial skull base is widely patent. The petrous, the cavernous and the supraclinoid segments are widely patent. The left middle cerebral artery demonstrates a 50% stenosis in the mid M1 segment. More distally the trifurcation branches are widely patent. The left anterior cerebral artery opacifies into the capillary and venous phases. The innominate arteriogram demonstrates patency of the proximal right subclavian artery with moderate tortuosity. The origin of the right vertebral artery is noted with ascent of contrast to the cranial skull base. Right common carotid arteriogram demonstrates the right external carotid artery and its major branches to be widely patent. The right internal carotid artery at the bulb to the cranial skull base is widely patent. The intracranial portion was not examined. PROCEDURE: The 5 French diagnostic catheter was then advanced over a 0.035 inch Roadrunner guidewire  to the distal right subclavian artery and exchanged over a stiff Glidewire for a 95 cm Neuron Max sheath inside of which was a 115 cm 5 [place] Catalyst guide catheter. The combination was then advanced and positioned just distal to the origin of the right vertebral artery. The exchange wire was removed. Combination of the 5 [place] Catalyst guide catheter and the Neuron Max sheath was then retrieved proximal to the origin of the right vertebral artery. Multiple attempts were then made using 035 inch guidewires, 016 micro guidewire  with 021 microcatheter, and 014 inch micro guidewire with the 021 micro catheters without success. A radial access was then obtained using ultrasound guidance. Initially the morphology of the right radial artery was identified with ultrasound and documented. A dorsal palmar anastomosis was verified to be present. Over a 0.018 inch micro guidewire and a micropuncture set, access was obtained with ultrasound guidance with placement of a 6/7 French radial sheath. The micro guidewire and the obturator were removed. Good aspiration obtained from the side port of the radial sheath. A cocktail of 2000 units of heparin, and 2.5 mg of verapamil, and 200 mcg of nitroglycerin was then infused without event. Over a 0.035 inch Roadrunner guidewire, a 5 [place] JB 1 catheter, JB 2 catheter and a JB 3 catheter were advanced and multiple attempts were made to cannulate the right vertebral artery without success. This approach was then abandoned. Another attempt was then made through the right common femoral artery puncture site. A 5 [place] JB 3 diagnostic catheter was then advanced over a 0.035 inch glidewire to the distal right subclavian artery. The guidewire was then gently retrieved proximally with the microcatheter retrieved until it engaged the origin of the right vertebral artery. A 0.035 inch Roadrunner guidewire was then advanced more distally into the right vertebral artery followed by the 5 [place] JB 3 catheter. Good aspiration obtained from the hub of the diagnostic catheter following removal of the guidewire. A control arteriogram performed through the diagnostic catheter demonstrated slow ascent contrast to the vertebrobasilar junction with complete occlusion of the dominant right vertebral artery at the level of C1. Over a 300 cm exchange 035 inch guidewire, combination of the Catalyst 115 cm guide catheter inside of an 8 [place] Neuron Max sheath was advanced to the distal right vertebral artery. The guidewire was  removed. Good aspiration obtained from the hub of the 5 [place] Catalyst guide catheter. A gentle contrast injection continued to demonstrate occluded dominant right vertebral artery at the level of C1. At this time a combination of an 021 162 cm microcatheter and 014 inch Synchro standard micro guidewire with a moderate J configuration was advanced to the distal end of the 5 [place] Catalyst guide catheter. Using a torque device, gentle advancement was performed of the micro guidewire followed by the microcatheter to the site of the occluded dominant right vertebrobasilar junction proximal to the dominant right posteroinferior cerebellar artery and distally just proximal to the basilar artery. Eventually access with the micro guidewire was obtained followed by the microcatheter. The micro guidewire was then advanced to the P2 region of the left posterior cerebral artery followed by the microcatheter. The guidewire was removed. Good aspiration obtained from the hub of the catheter. Gentle control arteriogram demonstrated safe position of the tip of the microcatheter. This was then connected to continuous heparinized saline infusion. A 4 mm x 40 mm Solitaire X retrieval device was then advanced to the distal end of the microcatheter. The O ring  on the delivery microcatheter was loosened and the delivery microcatheter was retrieved deploying the retrieval device. The 5 [place] Catalyst guide catheter was now advanced to the occluded segment of the dominant right vertebral artery proximal to the basilar artery. Thereafter constant aspiration was applied with a Penumbra aspiration device at the hub of the Catalyst guide catheter and with a 20 mL syringe at the hub of the Neuron Max sheath for approximately 3 minutes. The combination of the retrieval device, the microcatheter, and the 5 [place] Catalyst guide catheter was retrieved and removed. Following removal, there was free aspiration of blood at the hub of the Neuron  Max sheath at the level of the skull base. A control arteriogram performed now demonstrated revascularization of the right vertebrobasilar junction,the basilar artery, the posterior cerebral arteries, the superior cerebellar arteries and the left anterior inferior cerebellar artery. Also there was opacification of the right posterior-inferior cerebellar artery and retrograde flow into the left vertebrobasilar junction. Moderate amount of thrombus was noted entangled in the struts of the retrieval device. Moderate spasm was noted in the mid basilar artery,the proximal basilar artery and the right vertebrobasilar junction. This responded to 2 aliquots of nitroglycerin intra-arterially. Severe intracranial stenosis was noted at two sites at the right vertebrobasilar junction, one distal and one proximal to the right posterior-inferior cerebellar artery. Measurements were then performed of the distal right vertebrobasilar junction and the proximal right vertebrobasilar junction. It was decided to proceed with placement of a 3 mm x 22 mm Resolute Onyx stent. Over a 0.014 inch standard Synchro micro guidewire, an 021 160 cm microcatheter was advanced coaxially with a 5 [place] Catalyst guide catheter to the distal end of the Neuron Max sheath in the right vertebral artery. Two areas of high-grade stenosis were then crossed with the micro guidewire followed by the microcatheter which was advanced to the distal basilar artery. The micro guidewire was removed. Good aspiration obtained from the hub of the microcatheter. This in turn was replaced with a 300 cm 014 inch Zoom exchange micro guidewire. The distal end of the Zoom guidewire had a mild J configuration. The 3 mm x 22 mm Resolute Onyx balloon mounted stent was then retrogradely purged with heparinized saline infusion, and antegradely with 50% contrast and 50% heparinized saline infusion. Using the rapid exchange technique, the balloon mountable stent delivery apparatus  was advanced without difficulty and positioned adequate distance from the distal stenosis and proximal stenosis. This was then deployed with a micro inflation syringe device via micro tubing to 12 atmospheres achieving a 3.05 mm diameter. The balloon was deflated and retrieved and removed. A control arteriogram performed through the Catalyst guide catheter demonstrated significantly improved caliber and flow through the stented segment at the site of the proximal stenosis. Suboptimal coverage was noted of the distal stenosis. This necessitated the placement of a second Resolute Onyx stent 3 mm x 15 mm. Having been prepped as described above, the stent apparatus was advanced and positioned such that the distal portion of the second stent was just proximal to the basilar artery. Thereafter, this was then deployed in the usual manner using micro inflation syringe device via micro tubing to over 3 mm at approximately 11 atmospheres. The balloon was deflated and retrieved and removed. A control arteriogram performed through the Catalyst guide catheter in the proximal right vertebrobasilar junction now demonstrated excellent flow through the right vertebral artery, the right vertebrobasilar junction, the posterior cerebral arteries, the superior cerebellar arteries and the left anterior  inferior cerebellar artery. No significant flow was noted in the right anterior inferior cerebellar artery. There was retrograde flow into the left vertebrobasilar junction. Control arteriograms were then performed at approximally 10 and 25 minutes after placement of a second stent. These continued to demonstrate excellent flow without evidence of intraluminal filling defects. A TICI 2C revascularization of the vertebrobasilar system was noted as the right anterior inferior cerebellar artery was now angiographically verified. The 5 [place] Catalyst guide catheter was removed. The Neuron Max sheath was then retrieved into the proximal right  vertebral artery. A final control arteriogram performed through this demonstrated wide patency of the right vertebral artery intracranially and extracranially with no change in the revascularized posterior fossa. The 8 French Pinnacle sheath was removed. Manual compression was applied for hemostasis in the right groin puncture site. Distal pulses remained Dopplerable in both feet unchanged. A CT scan of the brain obtained prior to the placement of the intracranial stents, and after continued to demonstrate no evidence of mass effect, midline shift or of intracranial hemorrhage. Also, the patient was loaded with 180 mg of Brilinta orally via an OG tube prior to placement of the intracranial stents. Also right prior to placement of the stent, the patient was given a half loading dose of Cangrelor IV followed by a 4 hour infusion. CT of the brain was to be obtained at 4 hours of the Cangrelor infusion. The patient's general anesthesia was reversed. Patient was extubated. Upon recovery, the patient was able to obey simple commands appropriately. Denied any headaches or nausea. The patient was able to move all four extremities. His range of movements was within normal limits. He was then transferred to the neuro ICU for post revascularization management. 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 PLAN: Follow-up in clinic 2 weeks post discharge. Electronically Signed   By: Julieanne Cotton M.D.   On: 01/11/2021 08:35       HISTORY OF PRESENT ILLNESS Mr. Mehtab Dolberry is a 61 y.o. male with history of  CHF, CAD/NSTEMI s/p CABG, HTN,  obesity admitted for speech difficulty, b/l LE weakness and gait difficulty on 01/09/2021.  No tPA given due to outside window.   CTA showed basilar artery occlusion. He under went cerebral angiogram with Dr Corliss Skains, s/p complete revascularization of occluded basilar artery and RT VBJ with x 1 pass with 4mm x 40 mm solitaire X retriever and aspiration achioeving a TICI 2C revascularization . S/Pstent assisted angioplasty of underlying high grade stenosis of RT VBJ  Post CT head no ICH.   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  S/p angioplasty stenting  CTA head and neck -revealed b/l V3 and V4 occlusion, BA thrombus at [facility] level with right SCA occlusion. S/p IR occluded bilateral VBJs  and BA with TICI2c and right VBJ stenting. MRI brain Confluent right cerebellar SCA territory infarct with numerous other small infarcts scattered throughout the bilateral posterior circulation, including: Right lateral medulla, bilateral cerebellar hemispheres, right midbrain, right thalamus, bilateral occipital lobes. MRA brain posterior circulation patent. HGBA1c 5.3. He was taking a 81mg  ASA PTA and  has been continued in the hospital with the addition of Brilinta 90mg  BID.   CV Hypertension CHFrEF Hyperlipidemia He was hypertensive at admission requiring cleviprex gtt for BP control. He was restarted on home meds of coreg, spironolactone and lasix (entresto listed on home med rec but he states he does not take this anymore) and has been under control. LDL-c was 123, his home dose of 40mg  atorvastatin was increased to 80mg . 2D echo revealed an EF 35-40%.   DISCHARGE EXAM Blood pressure (!) 130/98, pulse (!) 56, temperature 98.6 F (37 C), temperature source Oral, resp. rate 14, SpO2 100 %.  Temp:  [97.6 F (36.4 C)-98.7 F (37.1 C)] 98.6 F (37 C) (10/23 1138) Pulse Rate:  [56-83] 56 (10/23  1138) Resp:  [14-17] 14 (10/23 1138) BP: (108-132)/(74-102) 130/98 (10/23 1138) SpO2:  [95 %-100 %] 100 % (10/23 1138)  General -obese middle-aged Caucasian male in no apparent distress.   Ophthalmologic - fundi not visualized due to noncooperation.   Cardiovascular - in trigeminy on monitor. Will obtain EKG   Mental Status -  Level of arousal and orientation to time, place, and person were intact. Language including expression, naming, repetition, comprehension was assessed and found intact. Attention span and concentration were normal. Recent and remote memory were intact. Fund of Knowledge was assessed and was intact.   Cranial Nerves II - XII - II - Visual field intact OU. III, IV, VI - Extraocular movements intact. V - Facial sensation intact bilaterally. VII - slight right facial droop (baseline) VIII - Hearing & vestibular intact bilaterally. X - Palate elevates symmetrically. XI - Chin turning & shoulder shrug intact bilaterally. XII - Tongue protrusion intact.   Motor Strength - RUE slightly weaker than right. Trouble with fine finger motor movements on right. The patient's strength was normal in LUE, LLE, and RLE extremities and pronator drift was absent.  Bulk was normal and fasciculations were absent.   Motor Tone - Muscle tone was assessed at the neck and appendages and was normal   Sensory - Light touch, temperature/pinprick were assessed and were symmetrical.     Coordination - slight dysmetria in Right FTN   Gait and Station - deferred.   Discharge Diet      Diet   Diet Heart Room service appropriate? Yes with Assist; Fluid consistency: Thin   liquids  DISCHARGE PLAN Disposition:  Transfer to [facility] for ongoing PT, OT and ST aspirin 81 mg daily and Brilinta (ticagrelor) 90 mg bid for secondary stroke prevention Recommend ongoing stroke risk factor control by Primary Care Physician at time of discharge from inpatient  rehabilitation. Follow-up PCP Tommie Sams, DO in 2 weeks following discharge from rehab. Follow-up in [facility] in 4 weeks following discharge from rehab, office to schedule an appointment.  Follow up with Dr. Corliss Skains in 2 weeks post discharge  40 minutes were spent preparing discharge.  Gevena Mart, NP   I have personally obtained history,examined this patient, reviewed notes, independently viewed imaging studies, participated in medical decision making and plan of care.ROS completed by me personally and pertinent positives fully documented  I have made any additions or clarifications directly to the above note. Agree with note above.    Delia Heady, MD Medical Director [facility] Pager: [phone] 01/14/2021 1:50 PM

## 2021-01-14 NOTE — Progress Notes (Signed)
INPATIENT REHABILITATION ADMISSION NOTE   Arrival Method: bed     Mental Orientation:alert   Assessment:done   Skin:done with Mathis Fare LPN   IV'S:none   Pain:none   Tubes and Drains:none   Safety Measures:explained   Vital Signs:done   Height and Weight:done   Rehab Orientation:done   Family:not in room    Notes: explained about policy re: valuables and pt understood

## 2021-01-14 NOTE — H&P (Signed)
Physical Medicine and Rehabilitation Admission H&P    CC: Stroke with functional deficits.    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  He was taken to IR suite immediately and underwent cerebral angiography to be laying occluded bilateral VV changes and basilar artery.  He underwent complete revascularization of VA and right UVJ with stent assisted angioplasty of right UVJ by Dr. Estanislado Pandy.  Post procedure CT head was negative for ICH.  MRI brain showed confluent right cerebellar ACA territory infarct with numerous small infarcts scattered throughout B-PCA including right lateral medullary, right midbrain, right thalamus, bilateral occipital lobe and bilateral cerebellar hemispheres as well as incidental findings of moderate chronic white matter disease, mild mass-effect on fourth ventricle and occasional punctate anterior circulation infarcts. MRA brain done revealing patent basilar artery without stenosis and patent bilateral AICA/SCA and PCA origin.  Patient on aspirin/Brilinta for stroke embolic from large vessel disease however cardioembolic source not ruled out.  Follow-up 2D echo showed EF of 35 to 40% with severe concentric LVH and LV with global hypokinesis. Cognitive evaluation showed mild dysarthria with mild cognitive impairment-SL UM scored 22 out of 30 as well as bouts of lability with tangential speech.  Therapy ongoing and patient with RLE ataxia with balance deficits, anxiety/fear as well as right upper extremity weakness affecting ADLs and mobility.  CIR was recommended due to functional decline. He currently has no complaints.    Review of Systems  Constitutional:  Negative for chills and fever.  Respiratory:  Negative for cough, shortness of breath and stridor.   Cardiovascular:  Negative for  chest pain.  Gastrointestinal:  Negative for heartburn and nausea.  Genitourinary:  Negative for dysuria.  Musculoskeletal:  Negative for myalgias.  Neurological:  Positive for weakness. Negative for dizziness and headaches.  Psychiatric/Behavioral:  Negative for memory loss. The patient is nervous/anxious and has insomnia.     Past Medical History:  Diagnosis Date   Acid reflux    Anasarca    Arthritis    SHOULDERS   CHF (congestive heart failure) (HCC)    Coronary artery disease 04/07/2013   cath by Dr. Ubaldo Glassing at [facility]   Essential hypertension, benign 04/06/2013   Hypertension    Kidney stone    Non-STEMI (non-ST elevated myocardial infarction) ([place]) 04/06/2013   NSTEMI (non-ST elevated myocardial infarction) ([place])    Obesity, unspecified 04/07/2013   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   LIMA to LAD, SVG to OM2, SVG to OM3, SVG to PDA, EVH via right thigh and bilateral lower legs    Past Surgical History:  Procedure Laterality Date   CORONARY ARTERY BYPASS GRAFT N/A 04/09/2013   Procedure: CORONARY ARTERY BYPASS GRAFTING (CABG);  Surgeon: Rexene Alberts, MD;  Location: [facility];  Service: Open Heart Surgery;  Laterality: N/A;  CABG x four,  using left internal mammary artery and right and left leg greater saphenous vein harvested endoscopically   INTRAOPERATIVE TRANSESOPHAGEAL ECHOCARDIOGRAM N/A 04/09/2013   Procedure: INTRAOPERATIVE TRANSESOPHAGEAL ECHOCARDIOGRAM;  Surgeon: Rexene Alberts, MD;  Location: [place];  Service: Open Heart Surgery;  Laterality: N/A;   IR ANGIO INTRA EXTRACRAN SEL COM CAROTID INNOMINATE UNI R MOD SED  01/09/2021   IR PERCUTANEOUS ART THROMBECTOMY/INFUSION INTRACRANIAL INC DIAG ANGIO  01/09/2021   IR US GUIDE VASC ACCESS  RIGHT  01/09/2021   RADIOLOGY WITH ANESTHESIA N/A 01/09/2021   Procedure: IR WITH ANESTHESIA;  Surgeon: Luanne Bras, MD;  Location: [facility];  Service: Radiology;  Laterality: N/A;   RIGHT/LEFT HEART CATH AND CORONARY/GRAFT ANGIOGRAPHY N/A 07/26/2016    Procedure: Right/Left Heart Cath and Coronary/Graft Angiography;  Surgeon: Jolaine Artist, MD;  Location: [place] [facility];  Service: Cardiovascular;  Laterality: N/A;    Family History  Problem Relation Age of Onset   Cancer Mother    Coronary artery disease Father    Hypertension Father    Heart attack Paternal Uncle     Social History: Lives alone and independent prior to admission.  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 He plans on staying with friends after discharge?  Reports that he has never smoked. He has never used smokeless tobacco. He reports that he does not drink alcohol and does not use drugs.   Allergies: No Known Allergies   Medications Prior to Admission  Medication Sig Dispense Refill   acetaminophen (TYLENOL) 325 MG tablet Take 2 tablets (650 mg total) by mouth every 4 (four) hours as needed for mild pain (or temp > 37.5 C (99.5 F)). 30 tablet 0   aspirin 81 MG chewable tablet Chew 1 tablet (81 mg total) by mouth daily. 30 tablet 0   atorvastatin (LIPITOR) 80 MG tablet Take 1 tablet (80 mg total) by mouth daily. 30 tablet 0   carvedilol (COREG) 6.25 MG tablet Take 1 tablet (6.25 mg total) by mouth 2 (two) times daily with a meal. 30 tablet 0   docusate sodium (COLACE) 100 MG capsule Take 1 capsule (100 mg total) by mouth daily. 10 capsule 0   furosemide (LASIX) 40 MG tablet Take 1 tablet (40 mg total) by mouth daily. 30 tablet 0   spironolactone (ALDACTONE) 25 MG tablet Take 1 tablet (25 mg total) by mouth daily. 30 tablet 0   ticagrelor (BRILINTA) 90 MG TABS tablet Take 1 tablet (90 mg total) by mouth 2 (two) times daily. 60 tablet 0    Drug Regimen Review  Drug regimen was reviewed and remains appropriate with no significant issues identified  Home: Home Living Family/patient expects to be discharged to:: Private residence Living Arrangements: Alone Available Help at Discharge: Friend(s), Available 24 hours/day Davy Pique and Sherren Mocha available 24/7) Type of Home:  House Home Access: Stairs to enter [employer] of Steps: 1 Home Layout: One level Bathroom Shower/Tub: Chiropodist: Standard Bathroom Accessibility: Yes Home Equipment: None  Lives With: Alone   Functional History: Prior Function Level of Independence: Independent   Functional Status:  Mobility: Bed Mobility Overal bed mobility: Needs Assistance Bed Mobility: Supine to Sit, Sit to Supine Supine to sit: Supervision Sit to supine: Supervision General bed mobility comments: bed flat with rails used Transfers Overall transfer level: Needs assistance Equipment used: None Transfers: Sit to/from Stand Sit to Stand: Min guard, Supervision General transfer comment: min guard with 1st stand, supervision for 2cd stand. cues for hand placement with 1st stand as pt went to reach to pull up on PTA. no cues needed with 2cd stand. Ambulation/Gait Ambulation/Gait assistance: Min guard, Min assist Gait Distance (Feet): 20 Feet Assistive device: None Gait Pattern/deviations: Step-through pattern, Decreased stride length, Ataxic General Gait Details: no UE support with PTA in front of pt cueing pt "step to my foot" to work on both step placement and step length. decreased ataxia noted with right LE as pt able to control step  placement this session. Gait velocity: decreased Gait velocity interpretation: <1.31 ft/sec, indicative of household ambulator Stairs: Yes Stairs assistance: Min guard Stair Management: Two rails, Step to pattern, Forwards Number of Stairs: 4 General stair comments: cues for safe sequencing with poor carryover, pt does better with cueing for foot placement with each step, no buckling noted. pt very reliant on BUE support stepping up and down.   ADL: ADL Overall ADL's : Needs assistance/impaired Eating/Feeding: Modified independent Grooming: Wash/dry hands, Supervision/safety, Standing Grooming Details (indicate cue type and reason):  completed at sink Upper Body Bathing: Set up, Supervision/ safety, Sitting Lower Body Bathing: Minimal assistance, Sit to/from stand Upper Body Dressing : Min guard, Minimal assistance, Standing Upper Body Dressing Details (indicate cue type and reason): increased assist due to donning gown in standing Lower Body Dressing: Minimal assistance Toilet Transfer: Min guard, Ambulation Toilet Transfer Details (indicate cue type and reason): Completed in bathroom Functional mobility during ADLs: Min guard, Minimal assistance, Rolling walker General ADL Comments: increased assist when making turns and requiring increased assurance and repeating steps to complete tasks   Cognition: Cognition Overall Cognitive Status: No family/caregiver present to determine baseline cognitive functioning Arousal/Alertness: Awake/alert Orientation Level: Oriented X4 Attention: Sustained Sustained Attention: Impaired Sustained Attention Impairment: Verbal basic, Verbal complex, Functional basic Memory: Impaired Memory Impairment: Storage deficit, Retrieval deficit, Decreased recall of new information Immediate Memory Recall:  (2/5 initially, 5/5 after several attempts (SLUMS)) Awareness: Impaired Awareness Impairment: Intellectual impairment, Emergent impairment Problem Solving: Impaired Problem Solving Impairment: Functional basic Executive Function: Self Monitoring, Self Correcting, Organizing, Sequencing Sequencing: Impaired Sequencing Impairment: Functional basic Organizing: Impaired Organizing Impairment: Functional basic Self Monitoring: Impaired Self Monitoring Impairment: Functional basic, Verbal basic Self Correcting: Impaired Self Correcting Impairment: Functional basic, Verbal basic Behaviors: Lability Cognition Arousal/Alertness: Awake/alert Behavior During Therapy: Anxious Overall Cognitive Status: No family/caregiver present to determine baseline cognitive functioning General Comments: Pt  continues to apologize throughout the session, requiring encouragment, and requiring increased assurance with all tasks. Difficulty remaining on topic at times with redirection needed. Emotionaly labile with session with laughing, anxiety and crying   Blood pressure (!) 125/95, pulse 76, temperature 98.7 F (37.1 C), temperature source Oral, resp. rate 18, height 6' (1.829 m), weight 112.3 kg, SpO2 97 %. Physical Exam Vitals and nursing note reviewed.  Gen: no distress, normal appearing HEENT: oral mucosa pink and moist, NCAT Cardio: Bradycardic Chest: normal effort, normal rate of breathing Abd: soft, non-distended Ext: no edema Psych: pleasant, flat affect, frequently apologizing, emotionally labile Skin: intact Neuro: Aox3. RUE 4/5, otherwise 5/5, Sensation intact. Right sided facial droop. +right sided dysmetria   Results for orders placed or performed during the hospital encounter of 01/09/21 (from the past 48 hour(s))  CBC     Status: None   Collection Time: 01/13/21  2:22 AM  Result Value Ref Range   WBC 9.7 4.0 - 10.5 K/uL   RBC 5.44 4.22 - 5.81 MIL/uL   Hemoglobin 16.6 13.0 - 17.0 g/dL   HCT 48.2 39.0 - 52.0 %   MCV 88.6 80.0 - 100.0 fL   MCH 30.5 26.0 - 34.0 pg   MCHC 34.4 30.0 - 36.0 g/dL   RDW 14.4 11.5 - 15.5 %   Platelets 198 150 - 400 K/uL   nRBC 0.0 0.0 - 0.2 %    Comment: Performed at [facility], [place] [address], [place], [place] [address]  Basic metabolic panel     Status: Abnormal   Collection Time: 01/13/21  2:22 AM  Result Value Ref Range   Sodium 134 (L) 135 - 145 mmol/L   Potassium 3.8 3.5 - 5.1 mmol/L   Chloride 100 98 - 111 mmol/L   CO2 23 22 - 32 mmol/L   Glucose, Bld 101 (H) 70 - 99 mg/dL    Comment: Glucose reference range applies only to samples taken after fasting for at least 8 hours.   BUN 24 (H) 6 - 20 mg/dL   Creatinine, Ser 1.14 0.61 - 1.24 mg/dL   Calcium 9.3 8.9 - 10.3 mg/dL   GFR, Estimated >60 >60 mL/min    Comment:  (NOTE) Calculated using the CKD-EPI Creatinine Equation (2021)    Anion gap 11 5 - 15    Comment: Performed at [facility] [address] [place], [place] [address]  CBC     Status: Abnormal   Collection Time: 01/14/21  3:51 AM  Result Value Ref Range   WBC 9.2 4.0 - 10.5 K/uL   RBC 5.60 4.22 - 5.81 MIL/uL   Hemoglobin 17.2 (H) 13.0 - 17.0 g/dL   HCT 49.6 39.0 - 52.0 %   MCV 88.6 80.0 - 100.0 fL   MCH 30.7 26.0 - 34.0 pg   MCHC 34.7 30.0 - 36.0 g/dL   RDW 14.0 11.5 - 15.5 %   Platelets 188 150 - 400 K/uL   nRBC 0.0 0.0 - 0.2 %    Comment: Performed at [facility], [place] [address], [place], [place] [address]  Basic metabolic panel     Status: Abnormal   Collection Time: 01/14/21  3:51 AM  Result Value Ref Range   Sodium 134 (L) 135 - 145 mmol/L   Potassium 3.8 3.5 - 5.1 mmol/L   Chloride 101 98 - 111 mmol/L   CO2 24 22 - 32 mmol/L   Glucose, Bld 114 (H) 70 - 99 mg/dL    Comment: Glucose reference range applies only to samples taken after fasting for at least 8 hours.   BUN 25 (H) 6 - 20 mg/dL   Creatinine, Ser 1.09 0.61 - 1.24 mg/dL   Calcium 9.0 8.9 - 10.3 mg/dL   GFR, Estimated >60 >60 mL/min    Comment: (NOTE) Calculated using the CKD-EPI Creatinine Equation (2021)    Anion gap 9 5 - 15    Comment: Performed at [facility] [address], [place], [place] [address]   No results found.     Medical Problem List and Plan: 1.  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  Antithrombotics: -DVT/anticoagulation:  Pharmaceutical: Lovenox  -antiplatelet therapy: Aspirin and Brilinta for BA stent/stroke X 1 year (was given 9 sample bottles as well as paperwork to apply for assistance by radiology) 3. Pain Management: N/A 4. Mood: LCSW to follow for evaluation and support  -antipsychotic agents: N/A 5. Neuropsych:  This patient is capable of making decisions on his own behalf. 6. Skin/Wound Care: Routine pressure-relief measures. 7. Fluids/Electrolytes/Nutrition: Monitor intake/output.  Check c-Met in AM. 8.  HTN: Monitor blood pressures TID--continue Coreg, Lasix and spironolactone. Add hydralazine 83m TID 9.  CAD/systolic CHF w/ EF 317-51% Check weights daily and monitor for signs of overload. --Continue Coreg, Entresto, Lasix, spironolactone, Lipitor and ASA. 10.  Dyslipidemia: On high-dose Lipitor 80 mg daily 11.  Polycythemia: Will monitor with serial CBC. 12.  Obesity: Encourage weight loss as well as appropriate diet to help promote overall health and mobility. 13. Bradycardic: decrease Coreg to 3.159m BID  I have personally performed a face to face diagnostic evaluation, including, but not limited to relevant history and physical exam findings, of this patient and developed relevant assessment and plan.  Additionally, I have reviewed and concur with the physician assistant's documentation above.  I have personally performed a face to face diagnostic evaluation, including, but not limited to relevant history and physical exam findings, of this patient and developed relevant assessment and plan.  Additionally, I have reviewed and concur with the physician assistant's documentation above.  PBary Leriche PA-C   KIzora Ribas MD 01/14/2021

## 2021-01-14 NOTE — H&P (Signed)
Physical Medicine and Rehabilitation Admission H&P    CC: Stroke with functional deficits.    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  He was taken to IR suite immediately and underwent cerebral angiography to be laying occluded bilateral VV changes and basilar artery.  He underwent complete revascularization of VA and right UVJ with stent assisted angioplasty of right UVJ by Dr. Estanislado Pandy.  Post procedure CT head was negative for ICH.  MRI brain showed confluent right cerebellar ACA territory infarct with numerous small infarcts scattered throughout B-PCA including right lateral medullary, right midbrain, right thalamus, bilateral occipital lobe and bilateral cerebellar hemispheres as well as incidental findings of moderate chronic white matter disease, mild mass-effect on fourth ventricle and occasional punctate anterior circulation infarcts. MRA brain done revealing patent basilar artery without stenosis and patent bilateral AICA/SCA and PCA origin.  Patient on aspirin/Brilinta for stroke embolic from large vessel disease however cardioembolic source not ruled out.  Follow-up 2D echo showed EF of 35 to 40% with severe concentric LVH and LV with global hypokinesis. Cognitive evaluation showed mild dysarthria with mild cognitive impairment-SL UM scored 22 out of 30 as well as bouts of lability with tangential speech.  Therapy ongoing and patient with RLE ataxia with balance deficits, anxiety/fear as well as right upper extremity weakness affecting ADLs and mobility.  CIR was recommended due to functional decline. He currently has no complaints.    Review of Systems  Constitutional:  Negative for chills and fever.  Respiratory:  Negative for cough, shortness of breath and stridor.   Cardiovascular:  Negative for  chest pain.  Gastrointestinal:  Negative for heartburn and nausea.  Genitourinary:  Negative for dysuria.  Musculoskeletal:  Negative for myalgias.  Neurological:  Positive for weakness. Negative for dizziness and headaches.  Psychiatric/Behavioral:  Negative for memory loss. The patient is nervous/anxious and has insomnia.     Past Medical History:  Diagnosis Date   Acid reflux    Anasarca    Arthritis    SHOULDERS   CHF (congestive heart failure) (HCC)    Coronary artery disease 04/07/2013   cath by Dr. Ubaldo Glassing at [facility]   Essential hypertension, benign 04/06/2013   Hypertension    Kidney stone    Non-STEMI (non-ST elevated myocardial infarction) ([place]) 04/06/2013   NSTEMI (non-ST elevated myocardial infarction) ([place])    Obesity, unspecified 04/07/2013   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   LIMA to LAD, SVG to OM2, SVG to OM3, SVG to PDA, EVH via right thigh and bilateral lower legs    Past Surgical History:  Procedure Laterality Date   CORONARY ARTERY BYPASS GRAFT N/A 04/09/2013   Procedure: CORONARY ARTERY BYPASS GRAFTING (CABG);  Surgeon: Rexene Alberts, MD;  Location: [place];  Service: Open Heart Surgery;  Laterality: N/A;  CABG x four,  using left internal mammary artery and right and left leg greater saphenous vein harvested endoscopically   INTRAOPERATIVE TRANSESOPHAGEAL ECHOCARDIOGRAM N/A 04/09/2013   Procedure: INTRAOPERATIVE TRANSESOPHAGEAL ECHOCARDIOGRAM;  Surgeon: Rexene Alberts, MD;  Location: [place];  Service: Open Heart Surgery;  Laterality: N/A;   IR ANGIO INTRA EXTRACRAN SEL COM CAROTID INNOMINATE UNI R MOD SED  01/09/2021   IR PERCUTANEOUS ART THROMBECTOMY/INFUSION INTRACRANIAL INC DIAG ANGIO  01/09/2021   IR US GUIDE VASC ACCESS  RIGHT  01/09/2021   RADIOLOGY WITH ANESTHESIA N/A 01/09/2021   Procedure: IR WITH ANESTHESIA;  Surgeon: Luanne Bras, MD;  Location: [place];  Service: Radiology;  Laterality: N/A;   RIGHT/LEFT HEART CATH AND CORONARY/GRAFT ANGIOGRAPHY N/A 07/26/2016    Procedure: Right/Left Heart Cath and Coronary/Graft Angiography;  Surgeon: Jolaine Artist, MD;  Location: [facility];  Service: Cardiovascular;  Laterality: N/A;    Family History  Problem Relation Age of Onset   Cancer Mother    Coronary artery disease Father    Hypertension Father    Heart attack Paternal Uncle     Social History: Lives alone and independent prior to admission.  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 He plans on staying with friends after discharge?  Reports that he has never smoked. He has never used smokeless tobacco. He reports that he does not drink alcohol and does not use drugs.   Allergies: No Known Allergies   Medications Prior to Admission  Medication Sig Dispense Refill   aspirin 81 MG tablet Take 1 tablet (81 mg total) by mouth daily. 90 tablet 3   ibuprofen (ADVIL) 200 MG tablet Take 400 mg by mouth every 6 (six) hours as needed for mild pain or headache.     [DISCONTINUED] docusate sodium (COLACE) 100 MG capsule Take 100 mg by mouth daily.     atorvastatin (LIPITOR) 40 MG tablet TAKE 1 TABLET(40 MG) BY MOUTH DAILY AT 6 PM (Patient not taking: Reported on 01/09/2021) 30 tablet 3   furosemide (LASIX) 40 MG tablet TAKE 1 TABLET(40 MG) BY MOUTH DAILY (Patient not taking: Reported on 01/09/2021) 90 tablet 2   sacubitril-valsartan (ENTRESTO) 97-103 MG Take 1 tablet by mouth 2 (two) times daily. Please schedule an appointment for further refills (Patient not taking: Reported on 01/09/2021) 60 tablet 0   spironolactone (ALDACTONE) 25 MG tablet Take 0.5 tablets (12.5 mg total) by mouth daily. (Patient not taking: Reported on 01/09/2021) 90 tablet 3   [DISCONTINUED] carvedilol (COREG) 6.25 MG tablet Take 1 tablet (6.25 mg total) by mouth 2 (two) times daily with a meal. (Patient not taking: Reported on 01/09/2021) 180 tablet 0    Drug Regimen Review  Drug regimen was reviewed and remains appropriate with no significant issues identified  Home: Home  Living Family/patient expects to be discharged to:: Private residence Living Arrangements: Alone Available Help at Discharge: Friend(s), Available 24 hours/day Davy Pique and Todd available 24/7) Type of Home: House Home Access: Stairs to enter [employer] of Steps: 1 Home Layout: One level Bathroom Shower/Tub: Chiropodist: Standard Bathroom Accessibility: Yes Home Equipment: None  Lives With: Alone   Functional History: Prior Function Level of Independence: Independent  Functional Status:  Mobility: Bed Mobility Overal bed mobility: Needs Assistance Bed Mobility: Supine to Sit, Sit to Supine Supine to sit: Supervision Sit to supine: Supervision General bed mobility comments: bed flat with rails used Transfers Overall transfer level: Needs assistance Equipment used: None Transfers: Sit to/from Stand Sit to Stand: Min guard, Supervision General transfer comment: min guard with 1st stand, supervision for 2cd stand. cues for hand placement with 1st stand as pt went to reach to pull up on PTA. no cues needed with 2cd stand. Ambulation/Gait Ambulation/Gait assistance: Min guard, Min assist Gait Distance (Feet): 20 Feet Assistive device: None Gait Pattern/deviations: Step-through pattern, Decreased stride length, Ataxic General Gait Details: no UE support with PTA in front of pt cueing pt "step to my foot" to work on  both step placement and step length. decreased ataxia noted with right LE as pt able to control step placement this session. Gait velocity: decreased Gait velocity interpretation: <1.31 ft/sec, indicative of household ambulator Stairs: Yes Stairs assistance: Min guard Stair Management: Two rails, Step to pattern, Forwards Number of Stairs: 4 General stair comments: cues for safe sequencing with poor carryover, pt does better with cueing for foot placement with each step, no buckling noted. pt very reliant on BUE support stepping up and  down.    ADL: ADL Overall ADL's : Needs assistance/impaired Eating/Feeding: Modified independent Grooming: Wash/dry hands, Supervision/safety, Standing Grooming Details (indicate cue type and reason): completed at sink Upper Body Bathing: Set up, Supervision/ safety, Sitting Lower Body Bathing: Minimal assistance, Sit to/from stand Upper Body Dressing : Min guard, Minimal assistance, Standing Upper Body Dressing Details (indicate cue type and reason): increased assist due to donning gown in standing Lower Body Dressing: Minimal assistance Toilet Transfer: Min guard, Ambulation Toilet Transfer Details (indicate cue type and reason): Completed in bathroom Functional mobility during ADLs: Min guard, Minimal assistance, Rolling walker General ADL Comments: increased assist when making turns and requiring increased assurance and repeating steps to complete tasks  Cognition: Cognition Overall Cognitive Status: No family/caregiver present to determine baseline cognitive functioning Arousal/Alertness: Awake/alert Orientation Level: Oriented X4 Attention: Sustained Sustained Attention: Impaired Sustained Attention Impairment: Verbal basic, Verbal complex, Functional basic Memory: Impaired Memory Impairment: Storage deficit, Retrieval deficit, Decreased recall of new information Immediate Memory Recall:  (2/5 initially, 5/5 after several attempts (SLUMS)) Awareness: Impaired Awareness Impairment: Intellectual impairment, Emergent impairment Problem Solving: Impaired Problem Solving Impairment: Functional basic Executive Function: Self Monitoring, Self Correcting, Organizing, Sequencing Sequencing: Impaired Sequencing Impairment: Functional basic Organizing: Impaired Organizing Impairment: Functional basic Self Monitoring: Impaired Self Monitoring Impairment: Functional basic, Verbal basic Self Correcting: Impaired Self Correcting Impairment: Functional basic, Verbal basic Behaviors:  Lability Cognition Arousal/Alertness: Awake/alert Behavior During Therapy: Anxious Overall Cognitive Status: No family/caregiver present to determine baseline cognitive functioning General Comments: Pt continues to apologize throughout the session, requiring encouragment, and requiring increased assurance with all tasks. Difficulty remaining on topic at times with redirection needed. Emotionaly labile with session with laughing, anxiety and crying.   Blood pressure (!) 130/98, pulse (!) 56, temperature 98.6 F (37 C), temperature source Oral, resp. rate 14, SpO2 100 %. Physical Exam Vitals and nursing note reviewed.  Gen: no distress, normal appearing HEENT: oral mucosa pink and moist, NCAT Cardio: Bradycardic Chest: normal effort, normal rate of breathing Abd: soft, non-distended Ext: no edema Psych: pleasant, flat affect Skin: intact Neuro: Aox3. RUE 4/5, otherwise 5/5, Sensation intact. Right sided facial droop. +right sided dysmetria   Results for orders placed or performed during the hospital encounter of 01/09/21 (from the past 48 hour(s))  CBC     Status: None   Collection Time: 01/13/21  2:22 AM  Result Value Ref Range   WBC 9.7 4.0 - 10.5 K/uL   RBC 5.44 4.22 - 5.81 MIL/uL   Hemoglobin 16.6 13.0 - 17.0 g/dL   HCT 48.2 39.0 - 52.0 %   MCV 88.6 80.0 - 100.0 fL   MCH 30.5 26.0 - 34.0 pg   MCHC 34.4 30.0 - 36.0 g/dL   RDW 14.4 11.5 - 15.5 %   Platelets 198 150 - 400 K/uL   nRBC 0.0 0.0 - 0.2 %    Comment: Performed at [facility], [place] [address], [place], [place] [address]  Basic metabolic panel  Status: Abnormal   Collection Time: 01/13/21  2:22 AM  Result Value Ref Range   Sodium 134 (L) 135 - 145 mmol/L   Potassium 3.8 3.5 - 5.1 mmol/L   Chloride 100 98 - 111 mmol/L   CO2 23 22 - 32 mmol/L   Glucose, Bld 101 (H) 70 - 99 mg/dL    Comment: Glucose reference range applies only to samples taken after fasting for at least 8 hours.   BUN 24 (H) 6 - 20 mg/dL    Creatinine, Ser 1.14 0.61 - 1.24 mg/dL   Calcium 9.3 8.9 - 10.3 mg/dL   GFR, Estimated >60 >60 mL/min    Comment: (NOTE) Calculated using the CKD-EPI Creatinine Equation (2021)    Anion gap 11 5 - 15    Comment: Performed at [facility] [address], [place], [place] [address]  CBC     Status: Abnormal   Collection Time: 01/14/21  3:51 AM  Result Value Ref Range   WBC 9.2 4.0 - 10.5 K/uL   RBC 5.60 4.22 - 5.81 MIL/uL   Hemoglobin 17.2 (H) 13.0 - 17.0 g/dL   HCT 49.6 39.0 - 52.0 %   MCV 88.6 80.0 - 100.0 fL   MCH 30.7 26.0 - 34.0 pg   MCHC 34.7 30.0 - 36.0 g/dL   RDW 14.0 11.5 - 15.5 %   Platelets 188 150 - 400 K/uL   nRBC 0.0 0.0 - 0.2 %    Comment: Performed at [facility], [place] [address], [place], [place] [address]  Basic metabolic panel     Status: Abnormal   Collection Time: 01/14/21  3:51 AM  Result Value Ref Range   Sodium 134 (L) 135 - 145 mmol/L   Potassium 3.8 3.5 - 5.1 mmol/L   Chloride 101 98 - 111 mmol/L   CO2 24 22 - 32 mmol/L   Glucose, Bld 114 (H) 70 - 99 mg/dL    Comment: Glucose reference range applies only to samples taken after fasting for at least 8 hours.   BUN 25 (H) 6 - 20 mg/dL   Creatinine, Ser 1.09 0.61 - 1.24 mg/dL   Calcium 9.0 8.9 - 10.3 mg/dL   GFR, Estimated >60 >60 mL/min    Comment: (NOTE) Calculated using the CKD-EPI Creatinine Equation (2021)    Anion gap 9 5 - 15    Comment: Performed at [facility] [address], [place], [place] [address]   No results found.     Medical Problem List and Plan: 1.  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  Antithrombotics: -DVT/anticoagulation:  Pharmaceutical: Lovenox  -antiplatelet therapy: Aspirin and Brilinta for BA stent/stroke X 1 year (was given 9 sample bottles as well as paperwork to apply for assistance by radiology) 3.  Pain Management: N/A 4. Mood: LCSW to follow for evaluation and support  -antipsychotic agents: N/A 5. Neuropsych: This patient is capable of making decisions on his own behalf. 6. Skin/Wound Care: Routine pressure-relief measures. 7. Fluids/Electrolytes/Nutrition: Monitor intake/output.  Check c-Met in AM. 8.  HTN: Monitor blood pressures TID--continue Coreg, Lasix and spironolactone. Add hydralazine 81m TID 9.  CAD/systolic CHF w/ EF 381-27% Check weights daily and monitor for signs of overload. --Continue Coreg, Entresto, Lasix, spironolactone, Lipitor and ASA. 10.  Dyslipidemia: On high-dose Lipitor 80 mg daily 11.  Polycythemia: Will monitor with serial CBC. 12.  Obesity: Encourage weight loss as well as appropriate diet to help promote overall health and mobility. 13. Bradycardic: decrease Coreg to 3.130m BID  I have personally performed a face to face diagnostic evaluation, including, but not limited to relevant history and physical exam findings, of this patient and developed relevant assessment and plan.  Additionally, I have reviewed and concur with the physician assistant's documentation above.  I have personally performed a face to face diagnostic evaluation, including, but not limited to relevant history and physical exam findings, of this patient and developed relevant assessment and plan.  Additionally, I have reviewed and concur with the physician assistant's documentation above.  PBary Leriche PA-C   KIzora Ribas MD 01/14/2021

## 2021-01-15 ENCOUNTER — Encounter (HOSPITAL_COMMUNITY): Payer: Self-pay

## 2021-01-15 DIAGNOSIS — I63532 Cerebral infarction due to unspecified occlusion or stenosis of left posterior cerebral artery: Secondary | ICD-10-CM

## 2021-01-15 HISTORY — PX: IR INTRA CRAN STENT: IMG2345

## 2021-01-15 HISTORY — PX: IR CT HEAD LTD: IMG2386

## 2021-01-15 LAB — CBC WITH DIFFERENTIAL/PLATELET
Abs Immature Granulocytes: 0.04 10*3/uL (ref 0.00–0.07)
Basophils Absolute: 0 10*3/uL (ref 0.0–0.1)
Basophils Relative: 1 %
Eosinophils Absolute: 0.1 10*3/uL (ref 0.0–0.5)
Eosinophils Relative: 2 %
HCT: 49.9 % (ref 39.0–52.0)
Hemoglobin: 17.1 g/dL — ABNORMAL HIGH (ref 13.0–17.0)
Immature Granulocytes: 1 %
Lymphocytes Relative: 22 %
Lymphs Abs: 1.8 10*3/uL (ref 0.7–4.0)
MCH: 30.5 pg (ref 26.0–34.0)
MCHC: 34.3 g/dL (ref 30.0–36.0)
MCV: 88.9 fL (ref 80.0–100.0)
Monocytes Absolute: 0.8 10*3/uL (ref 0.1–1.0)
Monocytes Relative: 10 %
Neutro Abs: 5.4 10*3/uL (ref 1.7–7.7)
Neutrophils Relative %: 64 %
Platelets: 211 10*3/uL (ref 150–400)
RBC: 5.61 MIL/uL (ref 4.22–5.81)
RDW: 14.1 % (ref 11.5–15.5)
WBC: 8.1 10*3/uL (ref 4.0–10.5)
nRBC: 0 % (ref 0.0–0.2)

## 2021-01-15 LAB — COMPREHENSIVE METABOLIC PANEL
ALT: 19 U/L (ref 0–44)
AST: 20 U/L (ref 15–41)
Albumin: 3.7 g/dL (ref 3.5–5.0)
Alkaline Phosphatase: 56 U/L (ref 38–126)
Anion gap: 9 (ref 5–15)
BUN: 27 mg/dL — ABNORMAL HIGH (ref 6–20)
CO2: 25 mmol/L (ref 22–32)
Calcium: 9.1 mg/dL (ref 8.9–10.3)
Chloride: 101 mmol/L (ref 98–111)
Creatinine, Ser: 1.23 mg/dL (ref 0.61–1.24)
GFR, Estimated: >60 mL/min (ref >60)
Glucose, Bld: 110 mg/dL — ABNORMAL HIGH (ref 70–99)
Potassium: 3.8 mmol/L (ref 3.5–5.1)
Sodium: 135 mmol/L (ref 135–145)
Total Bilirubin: 1.1 mg/dL (ref 0.3–1.2)
Total Protein: 6.8 g/dL (ref 6.5–8.1)

## 2021-01-15 NOTE — Progress Notes (Signed)
Inpatient Rehabilitation  Patient information reviewed and entered into eRehab system by Kaleigha Chamberlin Mykael Trott, OTR/L.   Information including medical coding, functional ability and quality indicators will be reviewed and updated through discharge.    

## 2021-01-15 NOTE — Evaluation (Signed)
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   LIMA to LAD, SVG to OM2, SVG to OM3, SVG to PDA, EVH via right thigh and bilateral lower legs   Past Surgical History:  Past Surgical History:  Procedure Laterality Date   CORONARY ARTERY BYPASS GRAFT N/A 04/09/2013   Procedure: CORONARY ARTERY BYPASS GRAFTING (CABG);  Surgeon: Rexene Alberts, MD;  Location: [facility];  Service: Open Heart Surgery;  Laterality: N/A;  CABG x four,  using left internal mammary artery and right and left leg greater saphenous vein harvested endoscopically   INTRAOPERATIVE TRANSESOPHAGEAL ECHOCARDIOGRAM N/A 04/09/2013   Procedure: INTRAOPERATIVE TRANSESOPHAGEAL ECHOCARDIOGRAM;  Surgeon: Rexene Alberts, MD;  Location: [place];  Service: Open Heart Surgery;  Laterality: N/A;   IR ANGIO INTRA EXTRACRAN SEL COM CAROTID INNOMINATE UNI R MOD SED  01/09/2021   IR PERCUTANEOUS ART THROMBECTOMY/INFUSION INTRACRANIAL INC DIAG ANGIO  01/09/2021   IR US GUIDE VASC ACCESS  RIGHT  01/09/2021   RADIOLOGY WITH ANESTHESIA N/A 01/09/2021   Procedure: IR WITH ANESTHESIA;  Surgeon: Luanne Bras, MD;  Location: [place];  Service: Radiology;  Laterality: N/A;   RIGHT/LEFT HEART CATH AND CORONARY/GRAFT ANGIOGRAPHY N/A 07/26/2016   Procedure: Right/Left Heart Cath and Coronary/Graft Angiography;  Surgeon: Jolaine Artist, MD;  Location: [facility];  Service: Cardiovascular;  Laterality: N/A;    Assessment & Plan Clinical Impression: Patient is a 60 y.o. 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  He was taken to IR suite immediately and underwent cerebral angiography to be laying occluded bilateral VV changes and basilar artery.  He underwent complete revascularization of VA and right UVJ with stent assisted angioplasty of right UVJ by Dr. Estanislado Pandy.  Post procedure CT head was negative for ICH.  MRI brain showed confluent right cerebellar ACA territory infarct with numerous small infarcts scattered throughout B-PCA including right lateral medullary, right midbrain, right thalamus, bilateral occipital lobe and bilateral cerebellar hemispheres as well as incidental findings of moderate chronic white matter disease, mild mass-effect on fourth ventricle and occasional punctate anterior circulation infarcts. MRA brain done revealing patent basilar artery without stenosis and patent bilateral AICA/SCA and PCA origin.   Patient on aspirin/Brilinta for stroke embolic from large vessel disease however cardioembolic source not ruled out.  Follow-up 2D echo showed EF of 35 to 40% with severe concentric LVH and LV with  global hypokinesis. Cognitive evaluation showed mild dysarthria with mild cognitive impairment-SL UM scored 22 out of 30 as well as bouts of lability with tangential speech.  Therapy ongoing and patient with RLE  ataxia with balance deficits, anxiety/fear as well as right upper extremity weakness affecting ADLs and mobility.  CIR was recommended due to functional decline. He currently has no complaints.   Patient currently requires min with mobility secondary to muscle weakness, decreased cardiorespiratoy endurance, decreased motor planning, decreased attention, decreased awareness, decreased problem solving, decreased safety awareness, and decreased memory, and decreased sitting balance, decreased standing balance, decreased postural control, hemiplegia, and decreased balance strategies.  Prior to hospitalization, patient was independent  with mobility and lived with Alone in a House home.  Home access is 1Stairs to enter.  Patient will benefit from skilled PT intervention to maximize safe functional mobility, minimize fall risk, and decrease caregiver burden for planned discharge home with 24 hour supervision.  Anticipate patient will benefit from follow up [place] at discharge.  PT - End of Session Activity Tolerance: Tolerates 30+ min activity with multiple rests Endurance Deficit: Yes PT Assessment Rehab Potential (ACUTE/IP ONLY): Good PT Barriers to Discharge: Decreased caregiver support;Behavior PT Barriers to Discharge Comments: very poor safety awareness PT Patient demonstrates impairments in the following area(s): Balance;Behavior;Endurance;Motor;Perception;Safety;Sensory;Skin Integrity PT Transfers Functional Problem(s): Bed Mobility;Bed to Chair;Car;Furniture;Floor PT Locomotion Functional Problem(s): Ambulation;Stairs PT Plan PT Intensity: Minimum of 1-2 x/day ,45 to 90 minutes PT Frequency: 5 out of 7 days PT Duration Estimated Length of Stay: 7-10 days PT Treatment/Interventions: Ambulation/gait training;Discharge planning;Functional mobility training;Psychosocial support;Therapeutic Activities;Visual/perceptual remediation/compensation;Balance/vestibular training;Disease  management/prevention;Neuromuscular re-education;Skin care/wound management;Therapeutic Exercise;Wheelchair propulsion/positioning;Cognitive remediation/compensation;DME/adaptive equipment instruction;Pain management;Splinting/orthotics;UE/LE Strength taining/ROM;Community reintegration;Functional electrical stimulation;Patient/family education;Stair training;UE/LE Coordination activities PT Transfers Anticipated Outcome(s): ModI PT Locomotion Anticipated Outcome(s): spv PT Recommendation Recommendations for Other Services: Speech consult;Neuropsych consult;Therapeutic Recreation consult Therapeutic Recreation Interventions: Stress management;Outing/community reintergration Follow Up Recommendations: Home health PT;24 hour supervision/assistance Patient destination: Home Equipment Recommended: To be determined   PT Evaluation Precautions/Restrictions Precautions Precautions: Fall Precaution Comments: Rt sided ataxia Restrictions Weight Bearing Restrictions: No General   Vital Signs  Pain Pain Assessment Pain Scale: 0-10 Pain Score: 0-No pain Pain Interference Pain Interference Pain Effect on Sleep: 0. Does not apply - I have not had any pain or hurting in the past 5 days Pain Interference with Therapy Activities: 0. Does not apply - I have not received rehabilitationtherapy in the past 5 days Pain Interference with Day-to-Day Activities: 1. Rarely or not at all Home Living/Prior [place]: Alone Available Help at Discharge: Friend(s);Available 24 hours/day (only staying with friends for a couple of weeks) Type of Home: House Home Access: Stairs to enter [employer] of Steps: 1 Entrance Stairs-Rails: None (patient reports that he doesn't need handrails "If I fall, I fall) Home Layout: One level Bathroom Shower/Tub: Chiropodist: Standard Bathroom Accessibility: Yes  Lives With: Alone Prior Function Level of  Independence: Independent with basic ADLs;Independent with transfers;Independent with homemaking with ambulation;Independent with gait  Able to Take Stairs?: Yes Driving: Yes Vocation: Full time employment Vision/Perception  Vision - History Ability to See in Adequate Light: 0 Adequate Perception Perception: Impaired Praxis Praxis: Impaired Praxis Impairment Details: Motor planning  Cognition Overall Cognitive Status: No family/caregiver present to determine baseline cognitive functioning Arousal/Alertness: Awake/alert Sustained Attention: Impaired Memory: Impaired Awareness: Impaired Problem Solving: Impaired Safety/Judgment: Impaired Sensation Sensation Light Touch: Appears Intact Hot/Cold: Not tested Proprioception: Impaired by gross assessment Stereognosis: Impaired by gross assessment  Coordination Gross Motor Movements are Fluid and Coordinated: No Fine Motor Movements are Fluid and Coordinated: No Finger Nose Finger Test: dysmteric and slower on R UE Heel Shin Test: slightly ataxia R LE Motor  Motor Motor: Ataxia Motor - Skilled Clinical Observations: R hemi ataxia   Trunk/Postural Assessment  Cervical Assessment Cervical Assessment: Exceptions to [facility] (forward head) Thoracic Assessment Thoracic Assessment: Exceptions to [facility] (increased kyphosis) Lumbar Assessment Lumbar Assessment: Exceptions to [facility] (posterior pelvic tilt) Postural Control Postural Control: Deficits on evaluation Righting Reactions: delayed and inadequate Protective Responses: delayed and inaduquate  Balance Balance Balance Assessed: Yes Standardized Balance Assessment Standardized Balance Assessment: Berg Balance Test;Functional Gait Assessment Berg Balance Test Sit to Stand: Able to stand  independently using hands Standing Unsupported: Able to stand 2 minutes with supervision Sitting with Back Unsupported but Feet Supported on Floor or Stool: Able to sit safely and securely 2  minutes Stand to Sit: Sits safely with minimal use of hands Transfers: Needs one person to assist Standing Unsupported with Eyes Closed: Able to stand 10 seconds with supervision Standing Ubsupported with Feet Together: Able to place feet together independently and stand for 1 minute with supervision From Standing, Reach Forward with Outstretched Arm: Reaches forward but needs supervision From Standing Position, Pick up Object from Floor: Unable to pick up and needs supervision From Standing Position, Turn to Look Behind Over each Shoulder: Looks behind from both sides and weight shifts well Turn 360 Degrees: Able to turn 360 degrees safely in 4 seconds or less Standing Unsupported, Alternately Place Feet on Step/Stool: Able to stand independently and complete 8 steps >20 seconds Standing Unsupported, One Foot in Front: Able to plae foot ahead of the other independently and hold 30 seconds Standing on One Leg: Able to lift leg independently and hold 5-10 seconds Total Score: 40 Static Sitting Balance Static Sitting - Balance Support: Feet supported Static Sitting - Level of Assistance: 5: Stand by assistance Dynamic Sitting Balance Dynamic Sitting - Balance Support: Feet supported;During functional activity Dynamic Sitting - Level of Assistance: 5: Stand by assistance Static Standing Balance Static Standing - Balance Support: During functional activity;No upper extremity supported Static Standing - Level of Assistance: 4: Min assist Dynamic Standing Balance Dynamic Standing - Balance Support: During functional activity;No upper extremity supported Dynamic Standing - Level of Assistance: 4: Min assist Functional Gait  Assessment Gait assessed : Yes Gait Level Surface: Walks 20 ft in less than 7 sec but greater than 5.5 sec, uses assistive device, slower speed, mild gait deviations, or deviates 6-10 in outside of the 12 in walkway width. Change in Gait Speed: Makes only minor adjustments to  walking speed, or accomplishes a change in speed with significant gait deviations, deviates 10-15 in outside the 12 in walkway width, or changes speed but loses balance but is able to recover and continue walking. Gait with Horizontal Head Turns: Performs head turns with moderate changes in gait velocity, slows down, deviates 10-15 in outside 12 in walkway width but recovers, can continue to walk. Gait with Vertical Head Turns: Performs task with moderate change in gait velocity, slows down, deviates 10-15 in outside 12 in walkway width but recovers, can continue to walk. Gait and Pivot Turn: Turns slowly, requires verbal cueing, or requires several small steps to catch balance following turn and stop Step Over Obstacle: Is able to step over one shoe box (4.5 in total height) but must slow down and adjust steps to clear box safely. May require verbal cueing.  Gait with Narrow Base of Support: Ambulates 4-7 steps. Gait with Eyes Closed: Walks 20 ft, slow speed, abnormal gait pattern, evidence for imbalance, deviates 10-15 in outside 12 in walkway width. Requires more than 9 sec to ambulate 20 ft. Ambulating Backwards: Walks 20 ft, slow speed, abnormal gait pattern, evidence for imbalance, deviates 10-15 in outside 12 in walkway width. Steps: Alternating feet, must use rail. Total Score: 12 FGA comment:: score may be effected by patients need for explicit and consistent cuing throughout assessment Extremity Assessment      RLE Assessment RLE Assessment: Within Functional Limits General Strength Comments: grossly 4/5 LLE Assessment LLE Assessment: Within Functional Limits General Strength Comments: grossly 5/5  Care Tool Care Tool Bed Mobility Roll left and right activity   Roll left and right assist level: Supervision/Verbal cueing    Sit to lying activity   Sit to lying assist level: Supervision/Verbal cueing    Lying to sitting on side of bed activity   Lying to sitting on side of bed  assist level: the ability to move from lying on the back to sitting on the side of the bed with no back support.: Supervision/Verbal cueing     Care Tool Transfers Sit to stand transfer   Sit to stand assist level: Contact Guard/Touching assist    Chair/bed transfer   Chair/bed transfer assist level: Minimal Assistance - Patient > 75%     Toilet transfer   Assist Level: Minimal Assistance - Patient > 75%    Car transfer   Car transfer assist level: Contact Guard/Touching assist      Care Tool Locomotion Ambulation   Assist level: Minimal Assistance - Patient > 75% Assistive device: Hand held assist Max distance: 160  Walk 10 feet activity   Assist level: Contact Guard/Touching assist Assistive device: Hand held assist   Walk 50 feet with 2 turns activity   Assist level: Minimal Assistance - Patient > 75% Assistive device: Hand held assist  Walk 150 feet activity   Assist level: Minimal Assistance - Patient > 75% Assistive device: Hand held assist  Walk 10 feet on uneven surfaces activity   Assist level: Minimal Assistance - Patient > 75% Assistive device: Hand held assist  Stairs   Assist level: Minimal Assistance - Patient > 75% Stairs assistive device: 2 hand rails Max number of stairs: 12  Walk up/down 1 step activity   Walk up/down 1 step (curb) assist level: Minimal Assistance - Patient > 75% Walk up/down 1 step or curb assistive device: 2 hand rails    Walk up/down 4 steps activity Walk up/down 4 steps assist level: Minimal Assistance - Patient > 75% Walk up/down 4 steps assistive device: 2 hand rails  Walk up/down 12 steps activity   Walk up/down 12 steps assist level: Minimal Assistance - Patient > 75% Walk up/down 12 steps assistive device: 2 hand rails  Pick up small objects from floor   Pick up small object from the floor assist level: Contact Guard/Touching assist    Wheelchair Is the patient using a wheelchair?: No          Wheel 50 feet with 2  turns activity      Wheel 150 feet activity        Refer to Care Plan for Long Term Goals  SHORT TERM GOAL WEEK 1 PT Short Term Goal 1 (Week 1): STG= LTG based on ELOS  Recommendations for other services: Neuropsych and Therapeutic Recreation  Stress management and Outing/community reintegration  Skilled Therapeutic Intervention Mobility Bed Mobility Bed Mobility: Rolling Right;Rolling Left;Sit to Supine;Supine to Sit Rolling Right: Supervision/verbal cueing Rolling Left: Supervision/Verbal cueing Supine to Sit: Supervision/Verbal cueing Sit to Supine: Supervision/Verbal cueing Transfers Transfers: Sit to Stand;Stand to Sit;Stand Pivot Transfers Sit to Stand: Contact Guard/Touching assist Stand to Sit: Contact Guard/Touching assist Stand Pivot Transfers: Minimal Assistance - Patient > 75% Stand Pivot Transfer Details: Verbal cues for precautions/safety Transfer (Assistive device): 1 person hand held assist Locomotion  Gait Ambulation: Yes Gait Assistance: Minimal Assistance - Patient > 75% Gait Distance (Feet): 160 Feet Assistive device: 1 person hand held assist Gait Assistance Details: Verbal cues for precautions/safety Gait Gait: Yes Gait Pattern: Impaired Gait Pattern: Step-through pattern;Wide base of support;Ataxic Gait velocity: decreased High Level Ambulation High Level Ambulation: Backwards walking;Direction changes;Sudden stops;Head turns Stairs / Additional Locomotion Stairs: Yes Stairs Assistance: Minimal Assistance - Patient > 75% Stair Management Technique: Two rails Number of Stairs: 12 Height of Stairs: 6 Ramp: Contact Guard/touching assist Curb: Minimal Assistance - Patient >75% Wheelchair Mobility Wheelchair Mobility: No   Discharge Criteria: Patient will be discharged from PT if patient refuses treatment 3 consecutive times without medical reason, if treatment goals not met, if there is a change in medical status, if patient makes no progress  towards goals or if patient is discharged from hospital.  The above assessment, treatment plan, treatment alternatives and goals were discussed and mutually agreed upon: by patient  Debbora Dus 01/15/2021, 12:02 PM

## 2021-01-15 NOTE — Progress Notes (Signed)
Izora Ribas, MD   Physician  Physical Medicine and Rehabilitation  PMR Pre-admission      Signed  Date of Service:  01/12/2021 12:55 PM       Related encounter: Admission (Discharged) from 01/09/2021 in [facility]       Signed      Show:Clear all [x] Written[x] Templated[] Copied  Added by: [x] Cristina Gong, RN[x] Ranell Patrick Clide Deutscher, MD  [] Hover for details                                                                                                                                                                                                                                                                                                                      PMR Admission Coordinator Pre-Admission Assessment   Patient: Joshua [name] is an 60 y.o., male MRN: [id number] DOB: Dec 19, 1960 Height:   Weight:     Insurance Information HMO:     PPO: yes     PCP:      IPA:      80/20:      OTHER:  PRIMARY: [place] BCBS of       Policy#: [id number]      Subscriber: pt CM Name: Malachy Mood   Phone#: [phone]     Fax#: [phone] Pre-Cert#: [id number] approved until 11/3      Employer:  Benefits:  Phone #: [phone]     Name: 10/20 Eff. Date: 03/25/2020     Deduct: $1500      Out of Pocket Max: $5900      Life Max: none CIR: 70%      SNF: 70% 100 days per year Outpatient: $72 copay per visit     Co-Pay: visits per medical neccesity Home Health: 70%      Co-Pay: visits per medical neccesity DME: 70%     Co-Pay: 30% Providers: in network  SECONDARY: none   Financial Counselor:       Phone#:    The Therapist, art Information Summary"  for patients in Inpatient Rehabilitation Facilities with attached "[place] Records" was provided and verbally reviewed with: N/A   Emergency Contact Information Contact Information       Name  Relation Home Work Mobile    [place] Friend     [phone]         Current Medical History  Patient Admitting Diagnosis: CVA   History of Present Illness: 59 year old male with medical history of HTN, CAD CABG and CHF presented on 01/09/2021 via EMS with slurred speech and Bilateral leg weakness.    No TPA given for outside of window. CTA head and neck with b/l V3 and V4 occlusion, BA thrombus at SCA level with right SCA level with right SCA occlusion. S/p IR occluded bilateral VNJs and BA with TIC12c and right VBJ stenting. MRI confluent right cerebellar SCA territory infarct with numerous other small infarcts scattered throughout the bilateral posterior circulation. 2 d echo with EF 35 to 40%. LDL 123. Hgb A1c 5.3. Placed on ASA daily and Brilinta. For CHF on Coreg, lasix and spironolactone. HTN treated with same. Off cleviprex.Continue Statin at discharge.   Complete NIHSS TOTAL: 1   Patient's medical record from [facility] has been reviewed by the rehabilitation admission coordinator and physician.   Past Medical History      Past Medical History:  Diagnosis Date   Acid reflux     Anasarca     Arthritis      SHOULDERS   CHF (congestive heart failure) (HCC)     Coronary artery disease 04/07/2013    cath by Dr. 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 Yes   Family History   family history includes Cancer in his mother; Coronary artery disease in his father; Heart attack in his paternal uncle; Hypertension in his father.   Current Medications   Current  Facility-Administered Medications:    acetaminophen (TYLENOL) tablet 650 mg, 650 mg, Oral, Q4H PRN **OR** acetaminophen (TYLENOL) 160 MG/5ML solution 650 mg, 650 mg, Per Tube, Q4H PRN **OR** acetaminophen (TYLENOL) suppository 650 mg, 650 mg, Rectal, Q4H PRN, Deveshwar, Sanjeev, MD   aspirin chewable tablet 81 mg, 81 mg, Oral, Daily, 81 mg at 01/12/21 0948 **OR** [DISCONTINUED] aspirin chewable tablet 81 mg, 81 mg, Per Tube, Daily, Deveshwar, Sanjeev, MD   atorvastatin (LIPITOR) tablet 80 mg, 80 mg, Oral, Daily, Rosalin Hawking, MD, 80 mg at 01/12/21 0947   carvedilol (COREG) tablet 6.25 mg, 6.25 mg, Oral, BID WC, Rosalin Hawking, MD, 6.25 mg at 01/12/21 1443   Chlorhexidine Gluconate Cloth 2 % PADS 6 each, 6 each, Topical, Daily, Rosalin Hawking, MD, 6 each at 01/12/21 1138   enoxaparin (LOVENOX) injection 40 mg, 40 mg, Subcutaneous, Q24H, Rosalin Hawking, MD, 40 mg at 01/11/21 2133   furosemide (LASIX) tablet 40 mg, 40 mg, Oral, Daily, Rosalin Hawking, MD, 40 mg at 01/12/21 0948   labetalol (NORMODYNE) injection 5-20 mg, 5-20 mg, Intravenous, Q2H PRN, Rosalin Hawking, MD   spironolactone (ALDACTONE) tablet 25 mg, 25  mg, Oral, Daily, Rosalin Hawking, MD, 25 mg at 01/12/21 0488   ticagrelor (BRILINTA) tablet 90 mg, 90 mg, Oral, BID, 90 mg at 01/12/21 0946 **OR** [DISCONTINUED] ticagrelor (BRILINTA) tablet 90 mg, 90 mg, Per Tube, BID, Luanne Bras, MD   Patients Current Diet:  Diet Order                  Diet Heart Room service appropriate? Yes with Assist; Fluid consistency: Thin  Diet effective now                       Precautions / Restrictions Precautions Precautions: Fall Precaution Comments: Rt sided ataxia Restrictions Weight Bearing Restrictions: No    Has the patient had 2 or more falls or a fall with injury in the past year? No   Prior Activity Level Community (5-7x/wk): Indpendent, driving and working   Prior Functional Level Self Care: Did the patient need help bathing, dressing, using the  toilet or eating? Independent   Indoor Mobility: Did the patient need assistance with walking from room to room (with or without device)? Independent   Stairs: Did the patient need assistance with internal or external stairs (with or without device)? Independent   Functional Cognition: Did the patient need help planning regular tasks such as shopping or remembering to take medications? Independent   Patient Information Are you of Hispanic, Latino/a,or Spanish origin?: A. No, not of Hispanic, Latino/a, or Spanish origin What is your race?: A. White Do you need or want an interpreter to communicate with a doctor or health care staff?: 0. No   Patient's Response To:  Health Literacy and Transportation Is the patient able to respond to health literacy and transportation needs?: Yes Health Literacy - How often do you need to have someone help you when you read instructions, pamphlets, or other written material from your doctor or pharmacy?: Never In the past 12 months, has lack of transportation kept you from medical appointments or from getting medications?: No In the past 12 months, has lack of transportation kept you from meetings, work, or from getting things needed for daily living?: No   Home Assistive Devices / Equipment Home Equipment: None   Prior Device Use: Indicate devices/aids used by the patient prior to current illness, exacerbation or injury? None of the above   Current Functional Level Cognition   Arousal/Alertness: Awake/alert Overall Cognitive Status: No family/caregiver present to determine baseline cognitive functioning Orientation Level: Oriented X4 General Comments: Emotional during session about not being able to work and afford medications, pleasant, "don't be mad at me, you think I am stupid, asking me things i do not know." Poor understanding of incorporation of cognitive tasks during mobility despite multiple explanations. Apologizing throughout session. Able to  recall 2/3 items for memory recall; naming things in categories well, counting down from 100 by 7s with some difficulty esp with walking. Attention: Sustained Sustained Attention: Impaired Sustained Attention Impairment: Verbal basic, Verbal complex, Functional basic Memory: Impaired Memory Impairment: Storage deficit, Retrieval deficit, Decreased recall of new information Awareness: Impaired Awareness Impairment: Intellectual impairment, Emergent impairment Problem Solving: Impaired Problem Solving Impairment: Functional basic Executive Function: Self Monitoring, Self Correcting, Organizing, Sequencing Sequencing: Impaired Sequencing Impairment: Functional basic Organizing: Impaired Organizing Impairment: Functional basic Self Monitoring: Impaired Self Monitoring Impairment: Functional basic, Verbal basic Self Correcting: Impaired Self Correcting Impairment: Functional basic, Verbal basic Behaviors: Lability    Extremity Assessment (includes Sensation/Coordination)   Upper Extremity Assessment: Defer to OT evaluation  RUE Deficits / Details: strength overall WFL however apparent ataxia; using functionally with minimal difficulty RUE Sensation: decreased light touch RUE Coordination: decreased fine motor, decreased gross motor  Lower Extremity Assessment: RLE deficits/detail RLE Deficits / Details: strength 5/5; ataxia noted with heel to shin on R RLE Sensation: decreased light touch, decreased proprioception RLE Coordination: decreased fine motor, decreased gross motor     ADLs   Overall ADL's : Needs assistance/impaired Eating/Feeding: Modified independent Grooming: Set up, Supervision/safety, Cueing for safety Grooming Details (indicate cue type and reason): Min A for steadying in standing Upper Body Bathing: Set up, Supervision/ safety, Sitting Lower Body Bathing: Minimal assistance, Sit to/from stand Upper Body Dressing : Supervision/safety, Set up, Sitting Lower Body  Dressing: Minimal assistance Toilet Transfer: Minimal assistance, +2 for safety/equipment, Ambulation, RW, Grab bars Functional mobility during ADLs: Minimal assistance, Rolling walker, +2 for safety/equipment     Mobility   Overal bed mobility: Needs Assistance Bed Mobility: Supine to Sit, Sit to Supine Supine to sit: Supervision, HOB elevated Sit to supine: Supervision, HOB elevated General bed mobility comments: No assist needed, use of rails.     Transfers   Overall transfer level: Needs assistance Equipment used: Rolling walker (2 wheeled), None Transfers: Sit to/from Stand Sit to Stand: Min guard General transfer comment: Min guard for safety, cues for hand placement/technique as pt wanting to pull up onRw. Stood from [employer] for strengthening/functional mobility.     Ambulation / Gait / Stairs / Wheelchair Mobility   Ambulation/Gait Ambulation/Gait assistance: Min guard, Herbalist (Feet): 200 Feet Assistive device: Rolling walker (2 wheeled) Gait Pattern/deviations: Step-through pattern, Decreased stride length, Ataxic General Gait Details: Slow, mildly unsteady and ataxic like gait esp through RLE; not able to dual task needing to stop and talk or problem solve. Declines using SPC, highly reliant on UEs for support during gait. Multiple standing rest breaks. Gait velocity: decreased Gait velocity interpretation: <1.31 ft/sec, indicative of household ambulator Stairs: Yes Stairs assistance: Min guard Stair Management: Two rails, Step to pattern, Forwards Number of Stairs: 4 General stair comments: cues for safe sequencing with poor carryover, pt does better with cueing for foot placement with each step, no buckling noted. pt very reliant on BUE support stepping up and down.     Posture / Balance Dynamic Sitting Balance Sitting balance - Comments: Able to reach down and adjust socks Balance Overall balance assessment: Needs assistance Sitting-balance support:  Feet supported, No upper extremity supported Sitting balance-Leahy Scale: Good Sitting balance - Comments: Able to reach down and adjust socks Standing balance support: During functional activity Standing balance-Leahy Scale: Fair Standing balance comment: static standing unsupported  but needing minA and/or UE support for walking     Special needs/care consideration Narda Rutherford NP gave him 9 sample bottles of Brilinta    Previous Home Environment  Living Arrangements: Alone  Lives With: Alone Available Help at Discharge: Friend(s), Available 24 hours/day (Sonya and [employer] available 24/7) Type of Home: House Home Layout: One level Home Access: Stairs to enter [employer] of Steps: 1 Bathroom Shower/Tub: Chiropodist: Standard Bathroom Accessibility: Yes How Accessible: Accessible via walker [place]: No   Discharge Living Setting Plans for Discharge Living Setting: Lives with (comment) (To d/c to [place] and Todd's home) Type of Home at Discharge: House Discharge Home Layout: Two level, Able to live on main level with bedroom/bathroom, Full bath on main level (finished attic that Jersey and her husband will move  upstairs) Discharge Home Access: Stairs to enter Entrance Stairs-Rails: None Entrance Stairs-Number of Steps: 2 Discharge Bathroom Shower/Tub: Tub/shower unit Discharge Bathroom Toilet: Standard Discharge Bathroom Accessibility: Yes How Accessible: Accessible via walker Does the patient have any problems obtaining your medications?: Yes (Describe)   Sonya's address is [place] Patient Roles:  (employee) Contact Information: Davy Pique and her husband , Sherren Mocha, Friends Anticipated Caregiver: Sonya Anticipated Caregiver's Contact Information: see above Ability/Limitations of Caregiver: Todd works, Davy Pique is at home 24/7 Caregiver Availability: 24/7 Discharge Plan Discussed with Primary  Caregiver: Yes Is Caregiver In Agreement with Plan?: Yes Does Caregiver/Family have Issues with Lodging/Transportation while Pt is in Rehab?: No   Goals Patient/Family Goal for Rehab: Mod I to intermittent supervision of PT, OT and SLP Expected length of stay: ELOS 7 to 10 days Additional Information: Patient very emotional and need reassurance Pt/Family Agrees to Admission and willing to participate: Yes Program Orientation Provided & Reviewed with Pt/Caregiver Including Roles  & Responsibilities: Yes   Decrease burden of Care through IP rehab admission: n/a   Possible need for SNF placement upon discharge: not anticipated   Patient Condition: I have reviewed medical records from [facility] , spoken with CM, and patient and family member. I met with patient at the bedside for inpatient rehabilitation assessment.  Patient will benefit from ongoing PT, OT, and SLP, can actively participate in 3 hours of therapy a day 5 days of the week, and can make measurable gains during the admission.  Patient will also benefit from the coordinated team approach during an Inpatient Acute Rehabilitation admission.  The patient will receive intensive therapy as well as Rehabilitation physician, nursing, social worker, and care management interventions.  Due to bladder management, bowel management, safety, skin/wound care, disease management, medication administration, pain management, and patient education the patient requires 24 hour a day rehabilitation nursing.  The patient is currently min assist overall with mobility and basic ADLs.  Discharge setting and therapy post discharge at home with home health is anticipated.  Patient has agreed to participate in the Acute Inpatient Rehabilitation Program and will admit Saturday when bed is available.   Preadmission Screen Completed By:  Cleatrice Burke, 01/12/2021 12:55 PM ______________________________________________________________________    Discussed status with Dr. Ranell Patrick on 01/12/2021 at 1322 and received approval for admission Saturday when bed is available.   Admission Coordinator:  Cleatrice Burke, RN, time 6811 Date 01/12/2021    Assessment/Plan: Diagnosis: Basilar artery embolic stroke Does the need for close, 24 hr/day Medical supervision in concert with the patient's rehab needs make it unreasonable for this patient to be served in a less intensive setting? Yes Co-Morbidities requiring supervision/potential complications: kidney stone, acid reflux, non-STEMI, essential hypertension, obesity, s/p CABG Due to bladder management, bowel management, safety, skin/wound care, disease management, medication administration, pain management, and patient education, does the patient require 24 hr/day rehab nursing? Yes Does the patient require coordinated care of a physician, rehab nurse, PT, OT to address physical and functional deficits in the context of the above medical diagnosis(es)? Yes Addressing deficits in the following areas: balance, endurance, locomotion, strength, transferring, bowel/bladder control, bathing, dressing, feeding, grooming, toileting, and psychosocial support Can the patient actively participate in an intensive therapy program of at least 3 hrs of therapy 5 days a week? Yes The potential for patient to make measurable gains while on inpatient rehab is excellent Anticipated functional outcomes upon discharge from inpatient rehab: modified independent PT, modified  independent OT, independent SLP Estimated rehab length of stay to reach the above functional goals is: 10-14 days Anticipated discharge destination: Home 10. Overall Rehab/Functional Prognosis: excellent     MD Signature: Leeroy Cha, MD         Revision History                     Note Details  Author Izora Ribas, MD File Time 01/14/2021  8:25 AM  Author Type Physician Status Signed  Last Editor Izora Ribas,  MD Service Physical Medicine and [place] # [id number] Admit Date 01/14/2021

## 2021-01-15 NOTE — Progress Notes (Signed)
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   LIMA to LAD, SVG to OM2, SVG to OM3, SVG to PDA, EVH via right thigh and bilateral lower legs   Past Surgical History:  Past Surgical History:  Procedure Laterality Date   CORONARY ARTERY BYPASS GRAFT N/A 04/09/2013   Procedure: CORONARY ARTERY BYPASS GRAFTING (CABG);  Surgeon: Rexene Alberts, MD;  Location: [facility];  Service: Open Heart Surgery;  Laterality: N/A;  CABG x four,  using left internal mammary artery and right and left leg greater saphenous vein harvested endoscopically   INTRAOPERATIVE TRANSESOPHAGEAL ECHOCARDIOGRAM N/A 04/09/2013   Procedure: INTRAOPERATIVE TRANSESOPHAGEAL ECHOCARDIOGRAM;  Surgeon: Rexene Alberts, MD;  Location: [facility];  Service: Open Heart Surgery;  Laterality: N/A;   IR ANGIO INTRA EXTRACRAN SEL COM CAROTID INNOMINATE UNI R MOD SED  01/09/2021   IR PERCUTANEOUS ART THROMBECTOMY/INFUSION INTRACRANIAL INC DIAG ANGIO  01/09/2021   IR US GUIDE VASC ACCESS RIGHT  01/09/2021   RADIOLOGY WITH ANESTHESIA N/A 01/09/2021   Procedure: IR WITH ANESTHESIA;  Surgeon: Luanne Bras, MD;  Location: [facility];  Service: Radiology;  Laterality: N/A;   RIGHT/LEFT HEART CATH AND CORONARY/GRAFT ANGIOGRAPHY N/A 07/26/2016   Procedure:  Right/Left Heart Cath and Coronary/Graft Angiography;  Surgeon: Jolaine Artist, MD;  Location: [facility];  Service: Cardiovascular;  Laterality: N/A;   Social History:  reports that he has never smoked. He has never used smokeless tobacco. He reports that he does not drink alcohol and does not use drugs.  Family / Support Systems Other Supports: Actor (friend) Anticipated Caregiver: Davy Pique and spouse Sherren Mocha & other friends Ability/Limitations of Caregiver: Sonya at home 24/7 Family Dynamics: support from friends  Social History Preferred language: English Religion: [employer] - How often do you need to have someone help you when you read instructions, pamphlets, or other written material from your doctor or pharmacy?: Never Writes: Yes Employment Status: Employed Public relations account executive Issues: n/a Guardian/Conservator: n/a   Abuse/Neglect Abuse/Neglect Assessment Can Be Completed: Yes Physical Abuse: Denies Verbal Abuse: Denies Sexual Abuse: Denies Exploitation of patient/patient's resources: Denies Self-Neglect: Denies  Patient response to: Social Isolation - How often do you feel lonely or isolated from those around you?: Never  Emotional Status Recent Psychosocial Issues: n/a Psychiatric History: n/a Substance Abuse History: n/a  Patient / Family Perceptions, Expectations & Goals Pt/Family understanding of illness & functional limitations: yes Premorbid pt/family roles/activities: Independent, driving and working Anticipated changes in roles/activities/participation: friends able to help pt at home Pt/family expectations/goals: Mod I to Intermittent Supervision  [place] Agencies: None Premorbid Home Care/DME Agencies: None Transportation available at discharge: friends avaliable to transport Is the patient able to respond to transportation needs?: Yes In the past 12 months, has  lack of transportation kept you from  medical appointments or from getting medications?: No In the past 12 months, has lack of transportation kept you from meetings, work, or from getting things needed for daily living?: No  Discharge Planning Living Arrangements: Alone Support Systems: Friends/neighbors Type of Residence: Private residence Insurance Resources: Multimedia programmer (specify) (BCBS of [place]) Financial Resources: Employment Financial Screen Referred: No Living Expenses: Other (Comment) (Lives alone, discharging with friend) Money Management: Patient Does the patient have any problems obtaining your medications?: No Home Management: Independent Patient/Family Preliminary Plans: Pt friends able to assist Care Coordinator Barriers to Discharge: Insurance for SNF coverage Care Coordinator Anticipated Follow Up Needs: HH/OP Expected length of stay: 7-10 Days  Clinical Impression SW met with patient, introduced self and briefly explained role. Pt expressed being tired wanting to nap. SW will follow up on with patient on Wednesday. No additional questions or concerns.   Dyanne Iha 01/15/2021, 1:04 PM

## 2021-01-15 NOTE — Progress Notes (Signed)
PROGRESS NOTE   Subjective/Complaints:  No pain issues, did not sleep well , had therapy this am , wants to take nap prior to lunch   ROS- neg CP, SOB, N/V/D  Objective:   No results found. Recent Labs    01/14/21 0351 01/15/21 0518  WBC 9.2 8.1  HGB 17.2* 17.1*  HCT 49.6 49.9  PLT 188 211   Recent Labs    01/14/21 0351 01/15/21 0518  NA 134* 135  K 3.8 3.8  CL 101 101  CO2 24 25  GLUCOSE 114* 110*  BUN 25* 27*  CREATININE 1.09 1.23  CALCIUM 9.0 9.1    Intake/Output Summary (Last 24 hours) at 01/15/2021 1221 Last data filed at 01/15/2021 0743 Gross per 24 hour  Intake 120 ml  Output 900 ml  Net -780 ml        Physical Exam: Vital Signs Blood pressure 117/77, pulse 68, temperature 98.7 F (37.1 C), temperature source Oral, resp. rate 17, height 6' (1.829 m), weight 112.1 kg, SpO2 97 %.  General: No acute distress Mood and affect are appropriate Heart: Regular rate and rhythm no rubs murmurs or extra sounds Lungs: Clear to auscultation, breathing unlabored, no rales or wheezes Abdomen: Positive bowel sounds, soft nontender to palpation, nondistended Extremities: No clubbing, cyanosis, or edema Skin: No evidence of breakdown, no evidence of rash Neurologic: Speech dysarthric no aphasia Cranial nerves II through XII intact, motor strength is 5/5 in bilateral deltoid, bicep, tricep, grip, hip flexor, knee extensors, ankle dorsiflexor and plantar flexor Sensory exam normal sensation to light touch  in bilateral upper and lower extremities Cerebellar exam normal finger to nose to finger as well as heel to shin in Left upper and lower extremities, mod dysmetria RUE , mild dysmetria RLE Musculoskeletal: Full range of motion in all 4 extremities. No joint swelling    Assessment/Plan: 1. Functional deficits which require 3+ hours per day of interdisciplinary therapy in a comprehensive inpatient rehab  setting. Physiatrist is providing close team supervision and 24 hour management of active medical problems listed below. Physiatrist and rehab team continue to assess barriers to discharge/monitor patient progress toward functional and medical goals  Care Tool:  Bathing              Bathing assist       Upper Body Dressing/Undressing Upper body dressing        Upper body assist      Lower Body Dressing/Undressing Lower body dressing            Lower body assist       Toileting Toileting    Toileting assist Assist for toileting: Independent with assistive device Assistive Device Comment: urinal   Transfers Chair/bed transfer  Transfers assist     Chair/bed transfer assist level: Minimal Assistance - Patient > 75%     Locomotion Ambulation   Ambulation assist      Assist level: Minimal Assistance - Patient > 75% Assistive device: Hand held assist Max distance: 160   Walk 10 feet activity   Assist     Assist level: Contact Guard/Touching assist Assistive device: Hand held assist   Walk 50 feet activity  Assist    Assist level: Minimal Assistance - Patient > 75% Assistive device: Hand held assist    Walk 150 feet activity   Assist    Assist level: Minimal Assistance - Patient > 75% Assistive device: Hand held assist    Walk 10 feet on uneven surface  activity   Assist     Assist level: Minimal Assistance - Patient > 75% Assistive device: Hand held assist   Wheelchair     Assist Is the patient using a wheelchair?: No             Wheelchair 50 feet with 2 turns activity    Assist            Wheelchair 150 feet activity     Assist          Blood pressure 117/77, pulse 68, temperature 98.7 F (37.1 C), temperature source Oral, resp. rate 17, height 6' (1.829 m), weight 112.1 kg, SpO2 97 %.  Medical Problem List and Plan: 1.  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  Antithrombotics: -DVT/anticoagulation:  Pharmaceutical: Lovenox             -antiplatelet therapy: Aspirin and Brilinta for BA stent/stroke X 1 year (was given 9 sample bottles as well as paperwork to apply for assistance by radiology) 3. Pain Management: N/A 4. Mood: LCSW to follow for evaluation and support             -antipsychotic agents: N/A 5. Neuropsych: This patient is capable of making decisions on his own behalf. 6. Skin/Wound Care: Routine pressure-relief measures. 7. Fluids/Electrolytes/Nutrition: Monitor intake/output.  Check c-Met in AM. 8.  HTN: Monitor blood pressures TID--continue Coreg, Lasix and spironolactone.  hydralazine 43m TID started 10/23, start metoprolol tartrate 12.518mBID on 10/24 9.  CAD/systolic CHF w/ EF 3521-78%Check weights daily and monitor for signs of overload. --Continue Coreg, Entresto, Lasix, spironolactone, Lipitor and ASA. Low fluid intake 12070mecorded with 900out , creat stable but will monitor  10.  Dyslipidemia: On high-dose Lipitor 80 mg daily 11.  Polycythemia: Will monitor with serial CBC. 12.  Obesity: Encourage weight loss as well as appropriate diet to help promote overall health and mobility. 13. Bradycardic: decrease Coreg to 3.125m36mD    LOS: 1 days A FACE TO FACE EVALUATION WAS PERFORMED  AndrCharlett Blake24/2022, 12:21 PM

## 2021-01-15 NOTE — Progress Notes (Signed)
Patient ID: Joshua [name], male   DOB: [birth year], 60 y.o.   MRN: [id number] Met with the patient to introduce self and role of the nurse care manager. Reviewed medical history and medications. Discussed secondary risk management including HTN, HF, and HLD. Reviewed DAPT; ASA and Brilinta per MD. Discussed rehab therapy schedule and plan of care. Continue to follow along to discharge and address educational needs. Collaborate with the  team to facilitate preparation for discharge. Margarito Liner

## 2021-01-15 NOTE — Plan of Care (Signed)
  Problem: RH Expression Communication Goal: LTG Patient will increase speech intelligibility (SLP) Description: LTG: Patient will increase speech intelligibility at word/phrase/conversation level with cues, % of the time (SLP) Flowsheets (Taken 01/15/2021 1539) LTG: Patient will increase speech intelligibility (SLP): Supervision   Problem: RH Problem Solving Goal: LTG Patient will demonstrate problem solving for (SLP) Description: LTG:  Patient will demonstrate problem solving for basic/complex daily situations with cues  (SLP) Flowsheets (Taken 01/15/2021 1539) LTG: Patient will demonstrate problem solving for (SLP):  Basic daily situations  Complex daily situations LTG Patient will demonstrate problem solving for: Supervision   Problem: RH Memory Goal: LTG Patient will use memory compensatory aids to (SLP) Description: LTG:  Patient will use memory compensatory aids to recall biographical/new, daily complex information with cues (SLP) Flowsheets (Taken 01/15/2021 1539) LTG: Patient will use memory compensatory aids to (SLP): Supervision   Problem: RH Awareness Goal: LTG: Patient will demonstrate awareness during functional activites type of (SLP) Description: LTG: Patient will demonstrate awareness during functional activites type of (SLP) Flowsheets (Taken 01/15/2021 1539) Patient will demonstrate during cognitive/linguistic activities awareness type of:  Intellectual  Emergent LTG: Patient will demonstrate awareness during cognitive/linguistic activities with assistance of (SLP): Supervision

## 2021-01-15 NOTE — Evaluation (Signed)
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   LIMA to LAD, SVG to OM2, SVG to OM3, SVG to PDA, EVH via right thigh and bilateral lower legs   Past Surgical History:  Past Surgical History:  Procedure Laterality Date   CORONARY ARTERY BYPASS GRAFT N/A 04/09/2013   Procedure: CORONARY ARTERY BYPASS GRAFTING (CABG);  Surgeon: Rexene Alberts, MD;  Location: [place];  Service: Open Heart Surgery;  Laterality: N/A;  CABG x four,  using left internal mammary artery and right and left leg greater saphenous vein harvested endoscopically   INTRAOPERATIVE TRANSESOPHAGEAL ECHOCARDIOGRAM N/A 04/09/2013   Procedure: INTRAOPERATIVE TRANSESOPHAGEAL ECHOCARDIOGRAM;  Surgeon: Rexene Alberts, MD;  Location: [place];  Service: Open Heart Surgery;  Laterality: N/A;   IR ANGIO INTRA EXTRACRAN SEL COM CAROTID INNOMINATE UNI R MOD SED  01/09/2021   IR PERCUTANEOUS ART THROMBECTOMY/INFUSION INTRACRANIAL INC DIAG ANGIO  01/09/2021   IR US GUIDE VASC ACCESS RIGHT  01/09/2021   RADIOLOGY WITH ANESTHESIA N/A 01/09/2021   Procedure: IR WITH  ANESTHESIA;  Surgeon: Luanne Bras, MD;  Location: [place];  Service: Radiology;  Laterality: N/A;   RIGHT/LEFT HEART CATH AND CORONARY/GRAFT ANGIOGRAPHY N/A 07/26/2016   Procedure: Right/Left Heart Cath and Coronary/Graft Angiography;  Surgeon: Jolaine Artist, MD;  Location: [place] [facility];  Service: Cardiovascular;  Laterality: N/A;    Assessment & Plan Clinical Impression: Patient is a 60 y.o. 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  He was taken to IR suite immediately and underwent cerebral angiography to be laying occluded bilateral VV changes and basilar artery.  He underwent complete revascularization of VA and right UVJ with stent assisted angioplasty of right UVJ by Dr. Estanislado Pandy.  Post procedure CT head was negative for ICH.  MRI brain showed confluent right cerebellar ACA territory infarct with numerous small infarcts scattered throughout B-PCA including right lateral medullary, right midbrain, right thalamus, bilateral occipital lobe and bilateral cerebellar hemispheres as well as incidental findings of moderate chronic white matter disease, mild mass-effect on fourth ventricle and occasional punctate anterior circulation infarcts. MRA brain done revealing patent basilar artery without stenosis and patent bilateral AICA/SCA and PCA origin.   Patient on aspirin/Brilinta for stroke embolic from large vessel disease however cardioembolic source not ruled out.  Follow-up 2D echo showed EF of 35 to 40% with severe concentric LVH and LV with global hypokinesis. Cognitive evaluation showed mild dysarthria  with mild cognitive impairment-SL UM scored 22 out of 30 as well as bouts of lability with tangential speech.  Therapy ongoing and patient with RLE ataxia with balance deficits, anxiety/fear as well as right upper extremity weakness  affecting ADLs and mobility.    Patient transferred to CIR on 01/14/2021 .    Patient currently requires min with basic self-care skills and IADL secondary to muscle weakness, decreased coordination and decreased motor planning, and decreased standing balance and decreased balance strategies.  Prior to hospitalization, patient could complete ADL, work and leisure activities with independent .  Patient will benefit from skilled intervention to decrease level of assist with basic self-care skills, increase independence with basic self-care skills, and increase level of independence with iADL prior to discharge  home with support of friends .  Anticipate patient will require intermittent supervision and follow up home health.  OT - End of Session Activity Tolerance: Tolerates 30+ min activity with multiple rests Endurance Deficit: Yes Endurance Deficit Description: mild fatigue with adl tasks, slow to complete tasks OT Assessment OT Patient demonstrates impairments in the following area(s): Balance;Cognition;Endurance;Motor;Safety OT Basic ADL's Functional Problem(s): Grooming;Bathing;Dressing;Toileting OT Advanced ADL's Functional Problem(s): Simple Meal Preparation;Light Housekeeping OT Transfers Functional Problem(s): Toilet;Tub/Shower OT Additional Impairment(s): None OT Plan OT Intensity: Minimum of 1-2 x/day, 45 to 90 minutes OT Frequency: 5 out of 7 days OT Duration/Estimated Length of Stay: 7-10 days OT Treatment/Interventions: Balance/vestibular training;Neuromuscular re-education;Self Care/advanced ADL retraining;Cognitive remediation/compensation;DME/adaptive equipment instruction;UE/LE Strength taining/ROM;Patient/family education;UE/LE Coordination activities;Discharge planning;Functional mobility training;Therapeutic Activities OT Self Feeding Anticipated Outcome(s): independent OT Basic Self-Care Anticipated Outcome(s): mod I / supervision OT Toileting Anticipated Outcome(s): mod I  / supervision OT Bathroom Transfers Anticipated Outcome(s): mod I / supervision OT Recommendation Patient destination: Home Follow Up Recommendations: Home health OT Equipment Recommended: Tub/shower seat   OT Evaluation Precautions/Restrictions  Precautions Precautions: Fall Restrictions Weight Bearing Restrictions: No General   Vital Signs Therapy Vitals Pulse Rate: 72 Resp: 16 BP: 139/84 Oxygen Therapy SpO2: 99 % Pain Pain Assessment Pain Scale: 0-10 Pain Score: 0-No pain Home Living/Prior Functioning Home Living Family/patient expects to be discharged to:: Private residence Living Arrangements: Alone Available Help at Discharge: Friend(s), Available 24 hours/day Type of Home: House Home Access: Stairs to enter Technical brewer of Steps: 1 Entrance Stairs-Rails: None Home Layout: One level Bathroom Shower/Tub: Optometrist: Yes  Lives With: Alone Prior Function Level of Independence: Independent with basic ADLs, Independent with transfers, Independent with homemaking with ambulation, Independent with gait  Able to Take Stairs?: Yes Driving: Yes Vocation: Full time employment 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 Baseline Vision/History: 1 Wears glasses Ability to See in Adequate Light: 0 Adequate Patient Visual Report: No change from baseline Vision Assessment?: Yes Eye Alignment: Within Functional Limits Ocular Range of Motion: Within Functional Limits Alignment/Gaze Preference: Within Defined Limits Tracking/Visual Pursuits: Able to track stimulus in all quads without difficulty Saccades: Within functional limits Convergence: Within functional limits Visual Fields: No apparent deficits Perception    Praxis Praxis: Impaired Praxis Impairment Details: Motor planning Cognition Overall Cognitive Status: No family/caregiver present to determine baseline cognitive  functioning Arousal/Alertness: Awake/alert Orientation Level: Person;Place;Situation Person: Oriented Place: Oriented Situation: Oriented Year: 2022 Month: October Day of Week: Correct Memory: Impaired Immediate Memory Recall: Sock;Blue;Bed Memory Recall Sock: With Cue Memory Recall Blue: Without Cue Memory Recall Bed: With Cue Sustained Attention: Impaired Awareness: Impaired Problem Solving: Impaired Safety/Judgment: Impaired Sensation Sensation Light Touch: Appears Intact Hot/Cold: Appears Intact  Coordination Fine Motor Movements are Fluid and Coordinated: No Finger Nose Finger Test: mild dysmetria right Motor  Motor Motor - Skilled Clinical Observations: R hemi ataxia  Trunk/Postural Assessment  Postural Control Righting Reactions: delayed and inadequate Protective Responses: delayed and inaduquate  Balance Static Sitting Balance Static Sitting - Level of Assistance: 5: Stand by assistance Dynamic Sitting Balance Dynamic Sitting - Balance Support: During functional activity Static Standing Balance Static Standing - Balance Support: During functional activity;No upper extremity supported Static Standing - Level of Assistance: 4: Min assist Dynamic Standing Balance Dynamic Standing - Balance Support: During functional activity;No upper extremity supported Dynamic Standing - Level of Assistance: 4: Min assist Extremity/Trunk Assessment RUE Assessment RUE Assessment: Within Functional Limits General Strength Comments: 4/5 LUE Assessment LUE Assessment: Within Functional Limits General Strength Comments: 4+/5  Care Tool Care Tool Self Care Eating   Eating Assist Level: Set up assist    Oral Care    Oral Care Assist Level: Minimal Assistance - Patient > 75%    Bathing   Body parts bathed by patient: Right arm;Left arm;Chest;Abdomen;Front perineal area;Buttocks;Right upper leg;Left upper leg;Right lower leg;Left lower leg;Face     Assist Level:  Supervision/Verbal cueing    Upper Body Dressing(including orthotics)   What is the patient wearing?: Pull over shirt   Assist Level: Supervision/Verbal cueing    Lower Body Dressing (excluding footwear)   What is the patient wearing?: Underwear/pull up;Pants Assist for lower body dressing: Contact Guard/Touching assist    Putting on/Taking off footwear   What is the patient wearing?: Non-skid slipper socks Assist for footwear: Supervision/Verbal cueing       Care Tool Toileting Toileting activity   Assist for toileting: Set up assist Assistive Device Comment: urinal   Care Tool Bed Mobility Roll left and right activity   Roll left and right assist level: Supervision/Verbal cueing    Sit to lying activity   Sit to lying assist level: Supervision/Verbal cueing    Lying to sitting on side of bed activity   Lying to sitting on side of bed assist level: the ability to move from lying on the back to sitting on the side of the bed with no back support.: Supervision/Verbal cueing     Care Tool Transfers Sit to stand transfer   Sit to stand assist level: Contact Guard/Touching assist    Chair/bed transfer   Chair/bed transfer assist level: Minimal Assistance - Patient > 75%     Toilet transfer   Assist Level: Contact Guard/Touching assist     Care Tool Cognition  Expression of Ideas and Wants Expression of Ideas and Wants: 3. Some difficulty - exhibits some difficulty with expressing needs and ideas (e.g, some words or finishing thoughts) or speech is not clear  Understanding Verbal and Non-Verbal Content Understanding Verbal and Non-Verbal Content: 3. Usually understands - understands most conversations, but misses some part/intent of message. Requires cues at times to understand   Memory/Recall Ability Memory/Recall Ability : Current season;Location of own room;Staff names and faces;That he or she is in a hospital/hospital unit   Refer to Care Plan for [place] [facility] OT Short Term Goal 1 (Week 1): STG = LTG due to estimated LOS  Recommendations for other services: None    Skilled Therapeutic Intervention  Patient pleasant and cooperative.  Evaluation completed as documented above.  He is eager to participate in adl training and practice with mobility in order to regain independence.  Adl  training completed with focus on safe techniques to reduce fall risk and maximize independence.  Reviewed activities to promote right hand coordination.  He was able to use right hand for oral care and bathing tasks.  Reviewed mobility and transfer options.  Practiced with shower bench and reviewed options for home.  He returned to bed at close of session to rest before next therapy session.  Bed alarm set and call bell in reach.  He demonstrates good understanding of safety procedures.    ADL ADL Eating: Supervision/safety Where Assessed-Eating: Edge of bed Grooming: Contact guard Where Assessed-Grooming: Standing at sink Upper Body Bathing: Supervision/safety Where Assessed-Upper Body Bathing: Shower Lower Body Bathing: Contact guard Where Assessed-Lower Body Bathing: Shower Upper Body Dressing: Supervision/safety;Setup Where Assessed-Upper Body Dressing: Chair Lower Body Dressing: Contact guard;Minimal assistance Where Assessed-Lower Body Dressing: Chair Toileting: Contact guard Where Assessed-Toileting: Glass blower/designer: Therapist, music Method: Counselling psychologist: Grab bars;Raised Counselling psychologist: Curator Method: Heritage manager: Transfer tub bench;Grab bars Mobility  Bed Mobility Bed Mobility: Rolling Right;Rolling Left;Sit to Supine;Supine to Sit Rolling Right: Supervision/verbal cueing Rolling Left: Supervision/Verbal cueing Supine to Sit: Supervision/Verbal cueing Sit to Supine: Supervision/Verbal  cueing Transfers Sit to Stand: Contact Guard/Touching assist Stand to Sit: Contact Guard/Touching assist   Discharge Criteria: Patient will be discharged from OT if patient refuses treatment 3 consecutive times without medical reason, if treatment goals not met, if there is a change in medical status, if patient makes no progress towards goals or if patient is discharged from hospital.  The above assessment, treatment plan, treatment alternatives and goals were discussed and mutually agreed upon: by patient  Carlos Levering 01/15/2021, 3:28 PM

## 2021-01-15 NOTE — Progress Notes (Signed)
Inpatient Rehabilitation Center Individual Statement of Services  Patient Name:  Joshua [name]  Date:  01/15/2021  Welcome to the Inpatient Rehabilitation Center.  Our goal is to provide you with an individualized program based on your diagnosis and situation, designed to meet your specific needs.  With this comprehensive rehabilitation program, you will be expected to participate in at least 3 hours of rehabilitation therapies Monday-Friday, with modified therapy programming on the weekends.  Your rehabilitation program will include the following services:  Physical Therapy (PT), Occupational Therapy (OT), Speech Therapy (ST), 24 hour per day rehabilitation nursing, Therapeutic Recreaction (TR), Neuropsychology, Care Coordinator, Rehabilitation Medicine, Nutrition Services, Pharmacy Services, and Other  Weekly team conferences will be held on Wednesdays to discuss your progress.  Your Inpatient Rehabilitation Care Coordinator will talk with you frequently to get your input and to update you on team discussions.  Team conferences with you and your family in attendance may also be held.  Expected length of stay:  7-10 Days  Overall anticipated outcome: MOD I to Intermittent Supervision  Depending on your progress and recovery, your program may change. Your Inpatient Rehabilitation Care Coordinator will coordinate services and will keep you informed of any changes. Your Inpatient Rehabilitation Care Coordinator's name and contact numbers are listed  below.  The following services may also be recommended but are not provided by the Inpatient Rehabilitation Center:   Home Health Rehabiltiation Services Outpatient Rehabilitation Services    Arrangements will be made to provide these services after discharge if needed.  Arrangements include referral to agencies that provide these services.  Your insurance has been verified to be:  [facility] Your primary doctor is:  Everlene Other,  DO  Pertinent information will be shared with your doctor and your insurance company.  Inpatient Rehabilitation Care Coordinator:  Lavera Guise, [place] [phone] or [phone]  Information discussed with and copy given to patient by: Andria Rhein, 01/15/2021, 11:28 AM

## 2021-01-15 NOTE — Plan of Care (Signed)
  Problem: RH Balance Goal: LTG Patient will maintain dynamic sitting balance (PT) Description: LTG:  Patient will maintain dynamic sitting balance with assistance during mobility activities (PT) Flowsheets (Taken 01/15/2021 1112) LTG: Pt will maintain dynamic sitting balance during mobility activities with:: Independent Goal: LTG Patient will maintain dynamic standing balance (PT) Description: LTG:  Patient will maintain dynamic standing balance with assistance during mobility activities (PT) Flowsheets (Taken 01/15/2021 1112) LTG: Pt will maintain dynamic standing balance during mobility activities with:: Independent   Problem: Sit to Stand Goal: LTG:  Patient will perform sit to stand with assistance level (PT) Description: LTG:  Patient will perform sit to stand with assistance level (PT) Flowsheets (Taken 01/15/2021 1112) LTG: PT will perform sit to stand in preparation for functional mobility with assistance level: Independent   Problem: RH Bed Mobility Goal: LTG Patient will perform bed mobility with assist (PT) Description: LTG: Patient will perform bed mobility with assistance, with/without cues (PT). Flowsheets (Taken 01/15/2021 1112) LTG: Pt will perform bed mobility with assistance level of: Independent   Problem: RH Bed to Chair Transfers Goal: LTG Patient will perform bed/chair transfers w/assist (PT) Description: LTG: Patient will perform bed to chair transfers with assistance (PT). Flowsheets (Taken 01/15/2021 1112) LTG: Pt will perform Bed to Chair Transfers with assistance level: Independent   Problem: RH Car Transfers Goal: LTG Patient will perform car transfers with assist (PT) Description: LTG: Patient will perform car transfers with assistance (PT). Flowsheets (Taken 01/15/2021 1112) LTG: Pt will perform car transfers with assist:: Supervision/Verbal cueing   Problem: RH Floor Transfers Goal: LTG Patient will perform floor transfers w/assist (PT) Description:  LTG: Patient will perform floor transfers with assistance (PT). Flowsheets (Taken 01/15/2021 1112) LTG: PT WILL PERFORM FLOOR TRANFERS  WITH  ASSIST:: Supervision/Verbal cueing   Problem: RH Ambulation Goal: LTG Patient will ambulate in controlled environment (PT) Description: LTG: Patient will ambulate in a controlled environment, # of feet with assistance (PT). Flowsheets (Taken 01/15/2021 1112) LTG: Pt will ambulate in controlled environ  assist needed:: Supervision/Verbal cueing LTG: Ambulation distance in controlled environment: 150 Goal: LTG Patient will ambulate in home environment (PT) Description: LTG: Patient will ambulate in home environment, # of feet with assistance (PT). Flowsheets (Taken 01/15/2021 1112) LTG: Pt will ambulate in home environ  assist needed:: Independent LTG: Ambulation distance in home environment: 50 Goal: LTG Patient will ambulate in community environment (PT) Description: LTG: Patient will ambulate in community environment, # of feet with assistance (PT). Flowsheets (Taken 01/15/2021 1112) LTG: Pt will ambulate in community environ  assist needed:: Supervision/Verbal cueing LTG: Ambulation distance in community environment: 300   Problem: RH Stairs Goal: LTG Patient will ambulate up and down stairs w/assist (PT) Description: LTG: Patient will ambulate up and down # of stairs with assistance (PT) Flowsheets (Taken 01/15/2021 1112) LTG: Pt will ambulate up/down stairs assist needed:: Supervision/Verbal cueing LTG: Pt will  ambulate up and down number of stairs: 4

## 2021-01-15 NOTE — Evaluation (Signed)
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   LIMA to LAD, SVG to OM2, SVG to OM3, SVG to PDA, EVH via right thigh and bilateral lower legs   Past Surgical History:  Past Surgical History:  Procedure Laterality Date   CORONARY ARTERY BYPASS GRAFT N/A 04/09/2013   Procedure: CORONARY ARTERY BYPASS GRAFTING (CABG);  Surgeon: Rexene Alberts, MD;  Location: [place];  Service: Open Heart Surgery;  Laterality: N/A;  CABG x four,  using left internal mammary artery and right and left leg greater saphenous vein harvested endoscopically   INTRAOPERATIVE TRANSESOPHAGEAL ECHOCARDIOGRAM N/A 04/09/2013   Procedure: INTRAOPERATIVE TRANSESOPHAGEAL ECHOCARDIOGRAM;  Surgeon: Rexene Alberts, MD;  Location: [place];  Service: Open Heart Surgery;  Laterality: N/A;   IR ANGIO INTRA EXTRACRAN SEL COM CAROTID INNOMINATE UNI R MOD SED  01/09/2021   IR CT HEAD LTD  01/15/2021   IR CT HEAD LTD  01/15/2021   IR INTRA CRAN STENT  01/15/2021   IR PERCUTANEOUS ART THROMBECTOMY/INFUSION INTRACRANIAL INC DIAG ANGIO  01/09/2021   IR US GUIDE VASC ACCESS RIGHT  01/09/2021    RADIOLOGY WITH ANESTHESIA N/A 01/09/2021   Procedure: IR WITH ANESTHESIA;  Surgeon: Luanne Bras, MD;  Location: [place];  Service: Radiology;  Laterality: N/A;   RIGHT/LEFT HEART CATH AND CORONARY/GRAFT ANGIOGRAPHY N/A 07/26/2016   Procedure: Right/Left Heart Cath and Coronary/Graft Angiography;  Surgeon: Jolaine Artist, MD;  Location: [place] [facility];  Service: Cardiovascular;  Laterality: N/A;    Assessment / Plan / Recommendation Clinical Impression 60 y/o male presented [facility] ED on 10/18 with speech difficulty and gait instability/bilateral LE weakness. Transferred to [facility]. 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 MRI (10/19) showed confluent R cerebellar SCA territory infarct with numerous other small infarcts scattered throughout the bilateral posterior circulation, including: Right lateral medulla, bilateral cerebellar hemispheres, right midbrain, right thalamus, bilateral occipital lobes. S/p thrombectomy on 10/18. PMH: CAD, HTN, CHF, NSTEMI. Pt admitted to [facility] 01/14/21.  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 During Cognistat assessment, pt scored WNL for orientation, comprehension, naming, calculations and judgement. Pt noted with poor reasoning, executive function, safety, problem solving, attention and delayed recall. Pt very verbose and labile during assessment with decreased focused and sustained attention impacting cognitive tasks. Pt with perseverative and tangential speech characteristics however was responsive to verbal cues throughout. Impulsivity noted during safety/judgement tasks which increases pt risk of falls. Pt also with frequent self-deprecating talk, encouraged to focus on positives at this time. At conversation level, pt speech ~75% due to rapid rate and decreased respiratory coordination. Pt with poor  awareness into decreased speech intelligibility at this time. Pt will benefit from skilled ST to promote independence and safety  with daily living tasks before discharge to friends home with recommended 24/7 supervision and follow up ST services.    Skilled Therapeutic Interventions          Pt participating in portions of Cognistat as well as further non-standardized assessments of speech, language and cognition. Please see above.   SLP Assessment  Patient will need skilled Speech Lanaguage Pathology Services during CIR admission    Recommendations  SLP Diet Recommendations: Age appropriate regular solids;Thin Liquid Administration via: Spoon;Cup Medication Administration: Whole meds with liquid Oral Care Recommendations: Oral care BID Recommendations for Other Services: Neuropsych consult Patient destination: Home Follow up Recommendations: Home Health SLP;Outpatient SLP;24 hour supervision/assistance Equipment Recommended: None recommended by SLP    SLP Frequency 3 to 5 out of 7 days   SLP Duration  SLP Intensity  SLP Treatment/Interventions 7-10 days  Minumum of 1-2 x/day, 30 to 90 minutes  Cognitive remediation/compensation;Internal/external aids;Therapeutic Activities;Therapeutic Exercise;Patient/family education;Cueing hierarchy;Functional tasks;Environmental controls    Pain Pain Assessment Pain Scale: 0-10 Pain Score: 0-No pain  Prior Functioning Cognitive/Linguistic Baseline: Within functional limits Type of Home: House  Lives With: Alone Available Help at Discharge: Friend(s);Available 24 hours/day Education: high school Vocation: Full time employment  SLP Evaluation Cognition Overall Cognitive Status: No family/caregiver present to determine baseline cognitive functioning Arousal/Alertness: Awake/alert Orientation Level: Oriented X4 Year: 2022 Month: October Day of Week: Correct Attention: Sustained;Focused Focused Attention: Impaired Focused Attention Impairment: Verbal basic;Functional basic Sustained Attention: Impaired Sustained Attention Impairment: Verbal basic;Functional  basic Memory: Impaired Memory Impairment: Storage deficit;Retrieval deficit;Decreased recall of new information Immediate Memory Recall: Sock;Blue;Bed Memory Recall Sock: With Cue Memory Recall Blue: Without Cue Memory Recall Bed: With Cue Awareness: Impaired Awareness Impairment: Intellectual impairment;Emergent impairment Problem Solving: Impaired Problem Solving Impairment: Functional basic;Verbal basic Executive Function: Self Monitoring;Self Correcting;Organizing;Sequencing;Reasoning Reasoning: Impaired Reasoning Impairment: Verbal basic;Functional basic Sequencing: Impaired Sequencing Impairment: Verbal basic;Functional basic Organizing: Impaired Organizing Impairment: Verbal basic;Functional basic Self Monitoring: Impaired Self Monitoring Impairment: Verbal basic;Functional basic Self Correcting: Impaired Self Correcting Impairment: Functional basic;Verbal basic Behaviors: Lability;Impulsive;Poor frustration tolerance Safety/Judgment: Impaired  Comprehension Auditory Comprehension Overall Auditory Comprehension: Appears within functional limits for tasks assessed Expression Expression Primary Mode of Expression: Verbal Verbal Expression Overall Verbal Expression: Appears within functional limits for tasks assessed Written Expression Dominant Hand: Right Written Expression: Not tested Oral Motor Oral Motor/Sensory Function Overall Oral Motor/Sensory Function: Within functional limits Motor Speech Overall Motor Speech: Impaired Phonation: Normal Resonance: Within functional limits Articulation: Impaired Level of Impairment: Sentence Intelligibility: Intelligibility reduced Word: 75-100% accurate Phrase: 75-100% accurate Sentence: 75-100% accurate Conversation: 75-100% accurate Motor Planning: Witnin functional limits Effective Techniques: Slow rate;Over-articulate;Increased vocal intensity;Pause;Pacing  Care Tool Care Tool Cognition Ability to hear (with  hearing aid or hearing appliances if normally used Ability to hear (with hearing aid or hearing appliances if normally used): 0. Adequate - no difficulty in normal conservation, social interaction, listening to TV   Expression of Ideas and Wants Expression of Ideas and Wants: 3. Some difficulty - exhibits some difficulty with expressing needs and ideas (e.g, some words or finishing thoughts) or speech is not clear   Understanding Verbal and Non-Verbal Content Understanding Verbal and Non-Verbal Content: 3. Usually understands - understands most conversations, but misses some part/intent of message. Requires cues at times to understand  Memory/Recall Ability Memory/Recall Ability : Current season;Location of own room;Staff names and faces;That he or she is in  a hospital/hospital unit   Intelligibility: Intelligibility reduced Word: 75-100% accurate Phrase: 75-100% accurate Sentence: 75-100% accurate Conversation: 75-100% accurate  Short Term Goals: Week 1: SLP Short Term Goal 1 (Week 1): STG = LTG d/t ELOS  Refer to Care Plan for Long Term Goals  Recommendations for other services: Neuropsych  Discharge Criteria: Patient will be discharged from SLP if patient refuses treatment 3 consecutive times without medical reason, if treatment goals not met, if there is a change in medical status, if patient makes no progress towards goals or if patient is discharged from hospital.  The above assessment, treatment plan, treatment alternatives and goals were discussed and mutually agreed upon: by patient  Dewaine Conger 01/15/2021, 3:49 PM

## 2021-01-16 DIAGNOSIS — I63532 Cerebral infarction due to unspecified occlusion or stenosis of left posterior cerebral artery: Secondary | ICD-10-CM | POA: Diagnosis not present

## 2021-01-16 NOTE — Progress Notes (Signed)
Occupational Therapy Session Note  Patient Details  Name: Joshua [name] MRN: [id number] Date of Birth: [birth year]  Today's Date: 01/16/2021 OT Individual Time: 1100-1153 OT Individual Time Calculation (min): 53 min    Short Term Goals: Week 1:  OT Short Term Goal 1 (Week 1): STG = LTG due to estimated LOS  Skilled Therapeutic Interventions/Progress Updates:  Pt greeted seated in w/c  agreeable to OT intervention. Session focus on IADL tasks in kitchen, functional mobility in ADL apt and therapeutic activities focused on RUE [facility]. Pt reports need to void bladder, CGA for ambulatory toilet transfer from w/c >BR with no AD, pt completed 3/3 toileting tasks with supervision. Pt ambulated to sink CGA where completed standing hand hygiene. Pt transported to ADL apt with total A from w/c. Pt completed dynamic reaching in kitchen to simulate job tasks as pt works in [employer]. Pt able to reach to low cabinets to retrieve pans/pots, place pans into oven, reach OH into cabinets, transport cup of water with overall CGA. Pt with good safety awareness asking for pot holders and recalling to remember to turn off oven. Pt additionally completed functional transfer to couch with CGA with no AD and able to step over tub with CGA with pt holding onto wall on R side. Do recommend use of seat in shower for energy conservation. Pt transported to gym with total A in w/c where pt worked on [facility] with RUE using peg board. Pt instructed to place pegs on  peg board with RUE only and then LUE only. Pt able to complete task in 31 secs on L hand and needed 56 secs with R hand. Pt then instructed on FMC/ cog task where pt instructed to duplicate pattern of ping pong balls based on visual aid using R hand only. Pt completed task with supervision with pt reporting feeling like his RUE was slower. Pt transported back to room with total A where pt completed stand pivot transfer with no AD with supervision, supervision to  return to supine where  pt left supine in bed with bed alarm activated and all needs within reach.                        Therapy Documentation Precautions:  Precautions Precautions: Fall Precaution Comments: Rt sided ataxia Restrictions Weight Bearing Restrictions: No    Pain: no pain reported during session     Therapy/Group: Individual Therapy  Barron Schmid 01/16/2021, 12:20 PM

## 2021-01-16 NOTE — Progress Notes (Signed)
Occupational Therapy Session Note  Patient Details  Name: Joshua [name] MRN: [id number] Date of Birth: 1960-06-21  Today's Date: 01/16/2021 OT Individual Time: 1345-1430 OT Individual Time Calculation (min): 45 min    Short Term Goals: Week 1:  OT Short Term Goal 1 (Week 1): STG = LTG due to estimated LOS  Skilled Therapeutic Interventions/Progress Updates:    Patient in bed, alert and ready for session, he denies pain and is able to recall activities completed this am.  Bed mobility CS/mod I. Sit to stand and ambulation in room without AD CS/CGA.  Able to stand for 20 minutes at sink with CS to complete shaving activity - able to use both right and left han to shave safely.  OH shirt mod I.  Ambulated on unit >200 feet with CS.  Completed dexterity assessment with box and blocks:  L = 46, R = 33. Completed various motor control activities - increasing ease of movement with practice.  He returned to bed at close of session, bed alarm set and call bell/tray table in reach.    Therapy Documentation Precautions:  Precautions Precautions: Fall Precaution Comments: Rt sided ataxia Restrictions Weight Bearing Restrictions: No   Therapy/Group: Individual Therapy  Barrie Lyme 01/16/2021, 7:43 AM

## 2021-01-16 NOTE — Progress Notes (Signed)
PROGRESS NOTE   Subjective/Complaints:  Patient seen in physical therapy.  The patient denies any dizziness today.  He notes incoordination of the right upper and right lower limb.  Physical therapy has already done some steps up to 12 yesterday.  He is doing well with ambulation still has some problems with limb ataxia right lower limb. No pain complaints  ROS- neg CP, SOB, N/V/D  Objective:   IR Intra Cran Stent  Result Date: 01/15/2021 INDICATION: Speech difficulty, gait instability and bilateral lower extremity weakness. 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   EXAM: 1. EMERGENT LARGE VESSEL OCCLUSION THROMBOLYSIS (POSTERIOR CIRCULATION)   COMPARISON:  CT angiogram of the head and neck 01/09/2021.   MEDICATIONS: Ancef 2 g IV antibiotic was administered within 1 hour of the procedure.   ANESTHESIA/SEDATION: General anesthesia   CONTRAST:  Omnipaque 300 approximately 75 cc.   FLUOROSCOPY TIME:  Fluoroscopy Time: 92 minutes 40 seconds (7422 mGy).   COMPLICATIONS: None immediate.   TECHNIQUE: Patient's neurological status and imaging findings were reviewed and discussed with Dr. Theda Sers, on-call neuro hospitalist.   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 Reasons were then made for patient's transfer to [facility] for endovascular treatment of the occluded basilar artery.   Informed witness consent was not obtainable as the patient was intubated and under general anesthesia. No family members or next of kin were available via telephone or physically.   Given the neuroimaging findings, and patient's clinical condition, an emergent consent was initiated.   The patient was then put under general anesthesia by the Department of Anesthesiology at [facility].   The right groin was prepped and draped in  the usual sterile fashion. Thereafter using modified Seldinger technique, transfemoral access into the right common femoral artery was obtained without difficulty. Over a 0.035 inch guidewire a 5 [place] Pinnacle 25 cm sheath was inserted. Through this, and also over a 0.035 inch guidewire a 5 [place] JB 1 catheter was advanced to the aortic arch region and selectively positioned in the left common carotid artery, the left vertebral artery, the right common carotid artery and the right vertebral artery.   FINDINGS: Non dominant left vertebral artery demonstrates mild stenosis at its origin.   The vessel demonstrates slow ascent of contrast to the cranial skull base to complete occlusion at the level of C2. Partial opacification is noted of the left vertebrobasilar junction just distal to the origin of the left posterior-inferior cerebellar artery, the posterior spinal artery. Incomplete opacification is seen of the left posterior-inferior cerebellar artery. 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   On the AP projection there is central prominent vessel which projects superiorly and in the midline opacifying the confluence of the vertebrobasilar junctions and retrogradely the right vertebrobasilar junction with partial opacification of the proximal right posteroinferior cerebellar artery.   No clearance of contrast is noted distally.   The left common carotid arteriogram demonstrates patency of the left external carotid artery and its major branches.  The left internal carotid artery at the bulb to the cranial skull base is widely patent.   The petrous, the cavernous and the supraclinoid segments are widely patent.   The left middle cerebral artery demonstrates a 50% stenosis in the mid M1 segment.   More distally the trifurcation branches are widely patent.   The left anterior cerebral artery opacifies into the capillary and  venous phases.   The innominate arteriogram demonstrates patency of the proximal right subclavian artery with moderate tortuosity. The origin of the right vertebral artery is noted with ascent of contrast to the cranial skull base. Right common carotid arteriogram demonstrates the right external carotid artery and its major branches to be widely patent.   The right internal carotid artery at the bulb to the cranial skull base is widely patent.   The intracranial portion was not examined.   PROCEDURE: The 5 French diagnostic catheter was then advanced over a 0.035 inch Roadrunner guidewire to the distal right subclavian artery and exchanged over a stiff Glidewire for a 95 cm Neuron Max sheath inside of which was a 115 cm 5 [place] Catalyst guide catheter.   The combination was then advanced and positioned just distal to the origin of the right vertebral artery.   The exchange wire was removed.   Combination of the 5 [place] Catalyst guide catheter and the Neuron Max sheath was then retrieved proximal to the origin of the right vertebral artery.   Multiple attempts were then made using 035 inch guidewires, 016 micro guidewire with 021 microcatheter, and 014 inch micro guidewire with the 021 micro catheters without success.   A radial access was then obtained using ultrasound guidance. Initially the morphology of the right radial artery was identified with ultrasound and documented.   A dorsal palmar anastomosis was verified to be present.   Over a 0.018 inch micro guidewire and a micropuncture set, access was obtained with ultrasound guidance with placement of a 6/7 French radial sheath. The micro guidewire and the obturator were removed. Good aspiration obtained from the side port of the radial sheath. A cocktail of 2000 units of heparin, and 2.5 mg of verapamil, and 200 mcg of nitroglycerin was then infused without event.   Over a 0.035 inch Roadrunner guidewire, a 5 [place] JB 1 catheter, JB 2 catheter and a JB 3  catheter were advanced and multiple attempts were made to cannulate the right vertebral artery without success. This approach was then abandoned.   Another attempt was then made through the right common femoral artery puncture site.   A 5 [place] JB 3 diagnostic catheter was then advanced over a 0.035 inch glidewire to the distal right subclavian artery.   The guidewire was then gently retrieved proximally with the microcatheter retrieved until it engaged the origin of the right vertebral artery.   A 0.035 inch Roadrunner guidewire was then advanced more distally into the right vertebral artery followed by the [place] 3 catheter.   Good aspiration obtained from the hub of the diagnostic catheter following removal of the guidewire. A control arteriogram performed through the diagnostic catheter demonstrated slow ascent contrast to the vertebrobasilar junction with complete occlusion of the dominant right vertebral artery at the level of C1.   Over a 300 cm exchange 035 inch guidewire, combination of the Catalyst 115 cm guide catheter inside of an 8 [place] Neuron Max sheath was advanced to the distal right vertebral artery.   The guidewire was removed. Good aspiration obtained  from the hub of the [place] guide catheter.   A gentle contrast injection continued to demonstrate occluded dominant right vertebral artery at the level of C1.   At this time a combination of an 021 162 cm microcatheter and 014 inch Synchro standard micro guidewire with a moderate J configuration was advanced to the distal end of the 5 [place] Catalyst guide catheter.   Using a torque device, gentle advancement was performed of the micro guidewire followed by the microcatheter to the site of the occluded dominant right vertebrobasilar junction proximal to the dominant right posteroinferior cerebellar artery and distally just proximal to the basilar artery.   Eventually access with the micro guidewire was obtained followed by the  microcatheter.   The micro guidewire was then advanced to the P2 region of the left posterior cerebral artery followed by the microcatheter. The guidewire was removed. Good aspiration obtained from the hub of the catheter. Gentle control arteriogram demonstrated safe position of the tip of the microcatheter. This was then connected to continuous heparinized saline infusion.   A 4 mm x 40 mm Solitaire X retrieval device was then advanced to the distal end of the microcatheter. The O ring on the delivery microcatheter was loosened and the delivery microcatheter was retrieved deploying the retrieval device. The 5 [place] Catalyst guide catheter was now advanced to the occluded segment of the dominant right vertebral artery proximal to the basilar artery.   Thereafter constant aspiration was applied with a Penumbra aspiration device at the hub of the Catalyst guide catheter and with a 20 mL syringe at the hub of the Neuron Max sheath for approximately 3 minutes.   The combination of the retrieval device, the microcatheter, and the 5 [place] Catalyst guide catheter was retrieved and removed. Following removal, there was free aspiration of blood at the hub of the Neuron Max sheath at the level of the skull base. A control arteriogram performed now demonstrated revascularization of the right vertebrobasilar junction,the basilar artery, the posterior cerebral arteries, the superior cerebellar arteries and the left anterior inferior cerebellar artery. Also there was opacification of the right posterior-inferior cerebellar artery and retrograde flow into the left vertebrobasilar junction.   Moderate amount of thrombus was noted entangled in the struts of the retrieval device.   Moderate spasm was noted in the mid basilar artery,the proximal basilar artery and the right vertebrobasilar junction.   This responded to 2 aliquots of nitroglycerin intra-arterially.   Severe intracranial stenosis was noted at two sites at the right  vertebrobasilar junction, one distal and one proximal to the right posterior-inferior cerebellar artery.   Measurements were then performed of the distal right vertebrobasilar junction and the proximal right vertebrobasilar junction. It was decided to proceed with placement of a 3 mm x 22 mm Resolute Onyx stent. Over a 0.014 inch standard Synchro micro guidewire, an 021 160 cm microcatheter was advanced coaxially with a 5 [place] Catalyst guide catheter to the distal end of the Neuron Max sheath in the right vertebral artery.   Two areas of high-grade stenosis were then crossed with the micro guidewire followed by the microcatheter which was advanced to the distal basilar artery.   The micro guidewire was removed. Good aspiration obtained from the hub of the microcatheter. This in turn was replaced with a 300 cm 014 inch Zoom exchange micro guidewire.   The distal end of the Zoom guidewire had a mild J configuration. The 3 mm x 22 mm Resolute Onyx balloon  mounted stent was then retrogradely purged with heparinized saline infusion, and antegradely with 50% contrast and 50% heparinized saline infusion.   Using the rapid exchange technique, the balloon mountable stent delivery apparatus was advanced without difficulty and positioned adequate distance from the distal stenosis and proximal stenosis.   This was then deployed with a micro inflation syringe device via micro tubing to 12 atmospheres achieving a 3.05 mm diameter.   The balloon was deflated and retrieved and removed. A control arteriogram performed through the Catalyst guide catheter demonstrated significantly improved caliber and flow through the stented segment at the site of the proximal stenosis.   Suboptimal coverage was noted of the distal stenosis. This necessitated the placement of a second Resolute Onyx stent 3 mm x 15 mm. Having been prepped as described above, the stent apparatus was advanced and positioned such that the distal portion of the second  stent was just proximal to the basilar artery.   Thereafter, this was then deployed in the usual manner using micro inflation syringe device via micro tubing to over 3 mm at approximately 11 atmospheres. The balloon was deflated and retrieved and removed. A control arteriogram performed through the Catalyst guide catheter in the proximal right vertebrobasilar junction now demonstrated excellent flow through the right vertebral artery, the right vertebrobasilar junction, the posterior cerebral arteries, the superior cerebellar arteries and the left anterior inferior cerebellar artery. No significant flow was noted in the right anterior inferior cerebellar artery.   There was retrograde flow into the left vertebrobasilar junction. Control arteriograms were then performed at approximally 10 and 25 minutes after placement of a second stent. These continued to demonstrate excellent flow without evidence of intraluminal filling defects. A TICI 2C revascularization of the vertebrobasilar system was noted as the right anterior inferior cerebellar artery was now angiographically verified.   The 5 [place] Catalyst guide catheter was removed. The Neuron Max sheath was then retrieved into the proximal right vertebral artery. A final control arteriogram performed through this demonstrated wide patency of the right vertebral artery intracranially and extracranially with no change in the revascularized posterior fossa.   The 8 French Pinnacle sheath was removed. Manual compression was applied for hemostasis in the right groin puncture site. Distal pulses remained Dopplerable in both feet unchanged.   A CT scan of the brain obtained prior to the placement of the intracranial stents, and after continued to demonstrate no evidence of mass effect, midline shift or of intracranial hemorrhage.   Also, the patient was loaded with 180 mg of Brilinta orally via an OG tube prior to placement of the intracranial stents. Also right prior to  placement of the stent, the patient was given a half loading dose of Cangrelor IV followed by a 4 hour infusion. CT of the brain was to be obtained at 4 hours of the Cangrelor infusion.   The patient's general anesthesia was reversed. Patient was extubated. Upon recovery, the patient was able to obey simple commands appropriately. Denied any headaches or nausea.   The patient was able to move all four extremities. His range of movements was within normal limits.   He was then transferred to the neuro ICU for post revascularization management.   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   PLAN: Follow-up in clinic 2 weeks post discharge.     Electronically Signed   By: Luanne Bras M.D.   On: 01/11/2021 08:35  IR CT [facility]  Result Date: 01/15/2021 INDICATION: Speech difficulty, gait instability and bilateral lower extremity weakness. 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   EXAM: 1. EMERGENT LARGE VESSEL OCCLUSION THROMBOLYSIS (POSTERIOR CIRCULATION)   COMPARISON:  CT angiogram of the head and neck 01/09/2021.   MEDICATIONS: Ancef 2 g IV antibiotic was administered within 1 hour of the procedure.   ANESTHESIA/SEDATION: General anesthesia   CONTRAST:  Omnipaque 300 approximately 75 cc.   FLUOROSCOPY TIME:  Fluoroscopy Time: 92 minutes 40 seconds (7422 mGy).   COMPLICATIONS: None immediate.   TECHNIQUE: Patient's neurological status and imaging findings were reviewed and discussed with Dr. Theda Sers, on-call neuro hospitalist.   Patient was deemed appropriate for endovascular revascularization given the  basilar artery occlusion confirmed on CT angiogram of the head and neck. Reasons were then made for patient's transfer to [facility] for endovascular treatment of the occluded basilar artery.   Informed witness consent was not obtainable as the patient was intubated and under general anesthesia. No family members or next of kin were available via telephone or physically.   Given the neuroimaging findings, and patient's clinical condition, an emergent consent was initiated.   The patient was then put under general anesthesia by the Department of Anesthesiology at [facility].   The right groin was prepped and draped in the usual sterile fashion. Thereafter using modified Seldinger technique, transfemoral access into the right common femoral artery was obtained without difficulty. Over a 0.035 inch guidewire a 5 [place] Pinnacle 25 cm sheath was inserted. Through this, and also over a 0.035 inch guidewire a 5 [place] JB 1 catheter was advanced to the aortic arch region and selectively positioned in the left common carotid artery, the left vertebral artery, the right common carotid artery and the right vertebral artery.   FINDINGS: Non dominant left vertebral artery demonstrates mild stenosis at its origin.   The vessel demonstrates slow ascent of contrast to the cranial skull base to complete occlusion at the level of C2. Partial opacification is noted of the left vertebrobasilar junction just distal to the origin of the left posterior-inferior cerebellar artery, the posterior spinal artery. Incomplete opacification is seen of the left posterior-inferior cerebellar artery. 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   On the AP projection there is central prominent vessel which projects superiorly and in the midline opacifying the confluence of the vertebrobasilar junctions and retrogradely the right vertebrobasilar  junction with partial opacification of the proximal right posteroinferior cerebellar artery.   No clearance of contrast is noted distally.   The left common carotid arteriogram demonstrates patency of the left external carotid artery and its major branches.   The left internal carotid artery at the bulb to the cranial skull base is widely patent.   The petrous, the cavernous and the supraclinoid segments are widely patent.   The left middle cerebral artery demonstrates a 50% stenosis in the mid M1 segment.   More distally the trifurcation branches are widely patent.   The left anterior cerebral artery opacifies into the capillary and venous phases.   The innominate arteriogram demonstrates patency of the proximal right subclavian artery with moderate  tortuosity. The origin of the right vertebral artery is noted with ascent of contrast to the cranial skull base. Right common carotid arteriogram demonstrates the right external carotid artery and its major branches to be widely patent.   The right internal carotid artery at the bulb to the cranial skull base is widely patent.   The intracranial portion was not examined.   PROCEDURE: The 5 French diagnostic catheter was then advanced over a 0.035 inch Roadrunner guidewire to the distal right subclavian artery and exchanged over a stiff Glidewire for a 95 cm Neuron Max sheath inside of which was a 115 cm 5 [place] Catalyst guide catheter.   The combination was then advanced and positioned just distal to the origin of the right vertebral artery.   The exchange wire was removed.   Combination of the 5 [place] Catalyst guide catheter and the Neuron Max sheath was then retrieved proximal to the origin of the right vertebral artery.   Multiple attempts were then made using 035 inch guidewires, 016 micro guidewire with 021 microcatheter, and 014 inch micro guidewire with the 021 micro catheters without success.   A radial access was then obtained using ultrasound guidance.  Initially the morphology of the right radial artery was identified with ultrasound and documented.   A dorsal palmar anastomosis was verified to be present.   Over a 0.018 inch micro guidewire and a micropuncture set, access was obtained with ultrasound guidance with placement of a 6/7 French radial sheath. The micro guidewire and the obturator were removed. Good aspiration obtained from the side port of the radial sheath. A cocktail of 2000 units of heparin, and 2.5 mg of verapamil, and 200 mcg of nitroglycerin was then infused without event.   Over a 0.035 inch Roadrunner guidewire, a 5 [place] JB 1 catheter, JB 2 catheter and a JB 3 catheter were advanced and multiple attempts were made to cannulate the right vertebral artery without success. This approach was then abandoned.   Another attempt was then made through the right common femoral artery puncture site.   A 5 [place] JB 3 diagnostic catheter was then advanced over a 0.035 inch glidewire to the distal right subclavian artery.   The guidewire was then gently retrieved proximally with the microcatheter retrieved until it engaged the origin of the right vertebral artery.   A 0.035 inch Roadrunner guidewire was then advanced more distally into the right vertebral artery followed by the [place] 3 catheter.   Good aspiration obtained from the hub of the diagnostic catheter following removal of the guidewire. A control arteriogram performed through the diagnostic catheter demonstrated slow ascent contrast to the vertebrobasilar junction with complete occlusion of the dominant right vertebral artery at the level of C1.   Over a 300 cm exchange 035 inch guidewire, combination of the Catalyst 115 cm guide catheter inside of an 8 [place] Neuron Max sheath was advanced to the distal right vertebral artery.   The guidewire was removed. Good aspiration obtained from the hub of the 5 [place] Catalyst guide catheter.   A gentle contrast injection continued to demonstrate  occluded dominant right vertebral artery at the level of C1.   At this time a combination of an 021 162 cm microcatheter and 014 inch Synchro standard micro guidewire with a moderate J configuration was advanced to the distal end of the 5 [place] Catalyst guide catheter.   Using a torque device, gentle advancement was performed of the micro guidewire followed by the  microcatheter to the site of the occluded dominant right vertebrobasilar junction proximal to the dominant right posteroinferior cerebellar artery and distally just proximal to the basilar artery.   Eventually access with the micro guidewire was obtained followed by the microcatheter.   The micro guidewire was then advanced to the P2 region of the left posterior cerebral artery followed by the microcatheter. The guidewire was removed. Good aspiration obtained from the hub of the catheter. Gentle control arteriogram demonstrated safe position of the tip of the microcatheter. This was then connected to continuous heparinized saline infusion.   A 4 mm x 40 mm Solitaire X retrieval device was then advanced to the distal end of the microcatheter. The O ring on the delivery microcatheter was loosened and the delivery microcatheter was retrieved deploying the retrieval device. The 5 [place] Catalyst guide catheter was now advanced to the occluded segment of the dominant right vertebral artery proximal to the basilar artery.   Thereafter constant aspiration was applied with a Penumbra aspiration device at the hub of the Catalyst guide catheter and with a 20 mL syringe at the hub of the Neuron Max sheath for approximately 3 minutes.   The combination of the retrieval device, the microcatheter, and the 5 [place] Catalyst guide catheter was retrieved and removed. Following removal, there was free aspiration of blood at the hub of the Neuron Max sheath at the level of the skull base. A control arteriogram performed now demonstrated revascularization of the right  vertebrobasilar junction,the basilar artery, the posterior cerebral arteries, the superior cerebellar arteries and the left anterior inferior cerebellar artery. Also there was opacification of the right posterior-inferior cerebellar artery and retrograde flow into the left vertebrobasilar junction.   Moderate amount of thrombus was noted entangled in the struts of the retrieval device.   Moderate spasm was noted in the mid basilar artery,the proximal basilar artery and the right vertebrobasilar junction.   This responded to 2 aliquots of nitroglycerin intra-arterially.   Severe intracranial stenosis was noted at two sites at the right vertebrobasilar junction, one distal and one proximal to the right posterior-inferior cerebellar artery.   Measurements were then performed of the distal right vertebrobasilar junction and the proximal right vertebrobasilar junction. It was decided to proceed with placement of a 3 mm x 22 mm Resolute Onyx stent. Over a 0.014 inch standard Synchro micro guidewire, an 021 160 cm microcatheter was advanced coaxially with a 5 [place] Catalyst guide catheter to the distal end of the Neuron Max sheath in the right vertebral artery.   Two areas of high-grade stenosis were then crossed with the micro guidewire followed by the microcatheter which was advanced to the distal basilar artery.   The micro guidewire was removed. Good aspiration obtained from the hub of the microcatheter. This in turn was replaced with a 300 cm 014 inch Zoom exchange micro guidewire.   The distal end of the Zoom guidewire had a mild J configuration. The 3 mm x 22 mm Resolute Onyx balloon mounted stent was then retrogradely purged with heparinized saline infusion, and antegradely with 50% contrast and 50% heparinized saline infusion.   Using the rapid exchange technique, the balloon mountable stent delivery apparatus was advanced without difficulty and positioned adequate distance from the distal stenosis and proximal  stenosis.   This was then deployed with a micro inflation syringe device via micro tubing to 12 atmospheres achieving a 3.05 mm diameter.   The balloon was deflated and retrieved and removed. A control arteriogram  performed through the Catalyst guide catheter demonstrated significantly improved caliber and flow through the stented segment at the site of the proximal stenosis.   Suboptimal coverage was noted of the distal stenosis. This necessitated the placement of a second Resolute Onyx stent 3 mm x 15 mm. Having been prepped as described above, the stent apparatus was advanced and positioned such that the distal portion of the second stent was just proximal to the basilar artery.   Thereafter, this was then deployed in the usual manner using micro inflation syringe device via micro tubing to over 3 mm at approximately 11 atmospheres. The balloon was deflated and retrieved and removed. A control arteriogram performed through the Catalyst guide catheter in the proximal right vertebrobasilar junction now demonstrated excellent flow through the right vertebral artery, the right vertebrobasilar junction, the posterior cerebral arteries, the superior cerebellar arteries and the left anterior inferior cerebellar artery. No significant flow was noted in the right anterior inferior cerebellar artery.   There was retrograde flow into the left vertebrobasilar junction. Control arteriograms were then performed at approximally 10 and 25 minutes after placement of a second stent. These continued to demonstrate excellent flow without evidence of intraluminal filling defects. A TICI 2C revascularization of the vertebrobasilar system was noted as the right anterior inferior cerebellar artery was now angiographically verified.   The 5 [place] Catalyst guide catheter was removed. The Neuron Max sheath was then retrieved into the proximal right vertebral artery. A final control arteriogram performed through this demonstrated wide  patency of the right vertebral artery intracranially and extracranially with no change in the revascularized posterior fossa.   The 8 French Pinnacle sheath was removed. Manual compression was applied for hemostasis in the right groin puncture site. Distal pulses remained Dopplerable in both feet unchanged.   A CT scan of the brain obtained prior to the placement of the intracranial stents, and after continued to demonstrate no evidence of mass effect, midline shift or of intracranial hemorrhage.   Also, the patient was loaded with 180 mg of Brilinta orally via an OG tube prior to placement of the intracranial stents. Also right prior to placement of the stent, the patient was given a half loading dose of Cangrelor IV followed by a 4 hour infusion. CT of the brain was to be obtained at 4 hours of the Cangrelor infusion.   The patient's general anesthesia was reversed. Patient was extubated. Upon recovery, the patient was able to obey simple commands appropriately. Denied any headaches or nausea.   The patient was able to move all four extremities. His range of movements was within normal limits.   He was then transferred to the neuro ICU for post revascularization management.   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   PLAN: Follow-up in clinic 2 weeks post discharge.     Electronically Signed   By: Luanne Bras M.D.   On: 01/11/2021 08:35  Recent Labs    01/14/21 0351 01/15/21 0518  WBC 9.2 8.1  HGB 17.2* 17.1*  HCT 49.6 49.9  PLT 188 211    Recent Labs    01/14/21 0351 01/15/21 0518  NA 134* 135  K 3.8  3.8   CL 101 101  CO2 24 25  GLUCOSE 114* 110*  BUN 25* 27*  CREATININE 1.09 1.23  CALCIUM 9.0 9.1     Intake/Output Summary (Last 24 hours) at 01/16/2021 0917 Last data filed at 01/16/2021 0831 Gross per 24 hour  Intake 896 ml  Output 1350 ml  Net -454 ml         Physical Exam: Vital Signs Blood pressure 123/75, pulse 63, temperature 97.6 F (36.4 C), temperature source Oral, resp. rate 17, height 6' (1.829 m), weight 112.1 kg, SpO2 95 %.   General: No acute distress Mood and affect are appropriate Heart: Regular rate and rhythm no rubs murmurs or extra sounds Lungs: Clear to auscultation, breathing unlabored, no rales or wheezes Abdomen: Positive bowel sounds, soft nontender to palpation, nondistended Extremities: No clubbing, cyanosis, or edema Skin: No evidence of breakdown, no evidence of rash   Neurologic: Speech dysarthric no aphasia Cranial nerves II through XII intact, motor strength is 5/5 in bilateral deltoid, bicep, tricep, grip, hip flexor, knee extensors, ankle dorsiflexor and plantar flexor Sensory exam normal sensation to light touch  in bilateral upper and lower extremities Cerebellar exam normal finger to nose to finger as well as heel to shin in Left upper and lower extremities, mod dysmetria RUE , mild dysmetria RLE Musculoskeletal: Full range of motion in all 4 extremities. No joint swelling    Assessment/Plan: 1. Functional deficits which require 3+ hours per day of interdisciplinary therapy in a comprehensive inpatient rehab setting. Physiatrist is providing close team supervision and 24 hour management of active medical problems listed below. Physiatrist and rehab team continue to assess barriers to discharge/monitor patient progress toward functional and medical goals  Care Tool:  Bathing    Body parts bathed by patient: Right arm, Left arm, Chest, Abdomen, Front perineal area, Buttocks, Right upper leg, Left upper leg, Right lower leg, Left  lower leg, Face         Bathing assist Assist Level: Supervision/Verbal cueing     Upper Body Dressing/Undressing Upper body dressing   What is the patient wearing?: Pull over shirt    Upper body assist Assist Level: Supervision/Verbal cueing    Lower Body Dressing/Undressing Lower body dressing      What is the patient wearing?: Underwear/pull up, Pants     Lower body assist Assist for lower body dressing: Contact Guard/Touching assist     Toileting Toileting    Toileting assist Assist for toileting: Set up assist Assistive Device Comment: urinal   Transfers Chair/bed transfer  Transfers assist     Chair/bed transfer assist level: Minimal Assistance - Patient > 75%     Locomotion Ambulation   Ambulation assist      Assist level: Minimal Assistance - Patient > 75% Assistive device: Hand held assist Max distance: 160   Walk 10 feet activity   Assist     Assist level: Contact Guard/Touching assist Assistive device: Hand held assist   Walk 50 feet activity   Assist    Assist level: Minimal Assistance - Patient > 75% Assistive device: Hand held assist    Walk 150 feet activity   Assist    Assist level: Minimal Assistance - Patient > 75% Assistive device: Hand held assist    Walk 10 feet on uneven surface  activity   Assist     Assist level: Minimal Assistance - Patient > 75% Assistive device: Hand held assist   Wheelchair     Assist Is  the patient using a wheelchair?: No             Wheelchair 50 feet with 2 turns activity    Assist            Wheelchair 150 feet activity     Assist          Blood pressure 123/75, pulse 63, temperature 97.6 F (36.4 C), temperature source Oral, resp. rate 17, height 6' (1.829 m), weight 112.1 kg, SpO2 95 %.  Medical Problem List and Plan: 1.  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  Antithrombotics: -DVT/anticoagulation:  Pharmaceutical: Lovenox             -antiplatelet therapy: Aspirin and Brilinta for BA stent/stroke X 1 year (was given 9 sample bottles as well as paperwork to apply for assistance by radiology) 3. Pain Management: N/A 4. Mood: LCSW to follow for evaluation and support             -antipsychotic agents: N/A 5. Neuropsych: This patient is capable of making decisions on his own behalf. 6. Skin/Wound Care: Routine pressure-relief measures. 7. Fluids/Electrolytes/Nutrition: Monitor intake/output.  Check c-Met in AM. 8.  HTN: Monitor blood pressures TID--continue Coreg 3.130m BID , Lasix 421mqd and spironolactone 2580md .  hydralazine 17m23mD started 10/23,  Vitals:   01/15/21 1949 01/16/21 0345  BP: 116/70 123/75  Pulse: 79 63  Resp: 19 17  Temp: 99.7 F (37.6 C) 97.6 F (36.4 C)  SpO2: 97% 95%    9.  CAD/systolic CHF w/ EF 35-474-82%eck weights daily and monitor for signs of overload. --Continue Coreg, Entresto, Lasix, spironolactone, Lipitor and ASA. Low fluid intake 120ml71morded with 900out , creat stable but will monitor  Check BMET in am  10.  Dyslipidemia: On high-dose Lipitor 80 mg daily 11.  Polycythemia: Will monitor with serial CBC. 12.  Obesity: Encourage weight loss as well as appropriate diet to help promote overall health and mobility. 13. Bradycardic: decrease Coreg to 3.125mg 19mrunning in 60s and 70s    LOS: 2 days A FACE TO FACE EVALUATION WAS PERFORMED  AndrewCharlett Blake/2022, 9:17 AM

## 2021-01-16 NOTE — Progress Notes (Signed)
Physical Therapy Session Note  Patient Details  Name: Joshua [name] MRN: [id number] Date of Birth: 03-28-1960  Today's Date: 01/16/2021 PT Individual Time: 0905-1002 PT Individual Time Calculation (min): 57 min   Short Term Goals: Week 1:  PT Short Term Goal 1 (Week 1): STG= LTG based on ELOS  Skilled Therapeutic Interventions/Progress Updates:    Pt received supine in bed and agreeable to therapy session. Supine>sitting R EOB, HOB slightly elevated with supervision. Short distance ~39ft ambulatory transfer to w/c, no AD, with light min assist for steadying.  Transported to/from gym in w/c for time management and energy conservation.   Gait training ~283ft, no AD, with light min assist for balance - mild R LE incoordination/ataxia noted with inconsistent step width but overall positive step length for reciprocal stepping and good R stance control.   Dynamic gait training: - agility ladder forward walking reciprocally with CGA/light min assist for steadying - agility ladder side stepping with light min assist for steadying with progressively increasing speed - 2 feet in, 2 feet out forward stepping with light min assist for balance, pt with more challenge recalling sequencing of this task resulting in slower speed of movement - agility ladder backwards stepping 2 feet per step leading with R LE progressed to reciprocal stepping requiring heavy min/mod assist for balance due to R anterior trunk lean when stepping backwards  Pt describes his R leg "from the knee down feels funky" then later described it as "wobbly." MD in/out for morning assessment.  Participated in dynamic gait training, no AD, while participating in sudden R/L or up/down head turns, sudden start/stop or fast/slow gait speed, sudden R/L turns, side stepping, and backwards walking all on verbal command - pt requires light min assist for steadying throughout but no overt LOB.  Of note, when pt walking back towards w/c for seated  rest breaks during session, he would have slight R knee sagging into flexion due to fatigue but no buckling and no increased assist required to maintain balance.  Participated in dynamic gait training and R UE NMR via gait through obstacle course including stepping up/down 4" step (stepping up with L LE and down with R LE) and stepping over hockey sticks while collecting circular number disks all with R UE for NMR with ability to manipulate ~7 disks in R hand without assist - light min assist for balance.  Transported back to room and with education on importance of upright, OOB positioning during the day, pt agreeable to remain sitting in w/c - left with needs in reach and chair alarm on with pt aware of fall risk safety precautions.     Therapy Documentation Precautions:  Precautions Precautions: Fall Precaution Comments: Rt sided ataxia Restrictions Weight Bearing Restrictions: No   Pain:   Denies pain during session.    Therapy/Group: Individual Therapy  Ginny Forth , PT, DPT, NCS, CSRS  01/16/2021, 9:19 AM

## 2021-01-16 NOTE — Progress Notes (Signed)
Speech Language Pathology Daily Session Note  Patient Details  Name: Joshua [name] MRN: [id number] Date of Birth: June 15, 1960  Today's Date: 01/16/2021 SLP Individual Time: 1300-1345 SLP Individual Time Calculation (min): 45 min  Short Term Goals: Week 1: SLP Short Term Goal 1 (Week 1): STG = LTG d/t ELOS  Skilled Therapeutic Interventions: Pt seen for skilled ST with focus on cognitive goals. Pt able to detail specific activities in PT and OT sessions this AM provided extra time. Speech intelligibility much improved this date, ~90% with cues for pacing. SLP facilitating mildly complex problem ID and solution generating task by providing min A verbal cues for 100% accuracy. Pt able to sequence 4-6 step picture cards with min fading to Supervision A cues. Pt often looking for reassurace from SLP during tasks, continues with talk such as "I'm stupid", "I'm not worth anything" if pt perceived error responses during activities. SLP focusing on cognitive strengths and encouraging pt to practice positivity during hospital stay. Pt occ labile but thankful for activities, left in bed with all needs within reach and bed alarm set.  Pain Pain Assessment Pain Scale: 0-10 Pain Score: 0-No pain  Therapy/Group: Individual Therapy  Tacey Ruiz 01/16/2021, 1:46 PM

## 2021-01-17 DIAGNOSIS — I6389 Other cerebral infarction: Secondary | ICD-10-CM

## 2021-01-17 LAB — BASIC METABOLIC PANEL
Anion gap: 13 (ref 5–15)
BUN: 27 mg/dL — ABNORMAL HIGH (ref 6–20)
CO2: 20 mmol/L — ABNORMAL LOW (ref 22–32)
Calcium: 9.1 mg/dL (ref 8.9–10.3)
Chloride: 102 mmol/L (ref 98–111)
Creatinine, Ser: 1.11 mg/dL (ref 0.61–1.24)
GFR, Estimated: >60 mL/min (ref >60)
Glucose, Bld: 100 mg/dL — ABNORMAL HIGH (ref 70–99)
Potassium: 4.3 mmol/L (ref 3.5–5.1)
Sodium: 135 mmol/L (ref 135–145)

## 2021-01-17 MED ORDER — CARVEDILOL 3.125 MG PO TABS: 3.1250 mg | ORAL_TABLET | Freq: Two times a day (BID) | ORAL | 0 refills | Status: DC

## 2021-01-17 MED ORDER — HYDRALAZINE HCL 10 MG PO TABS: 10.0000 mg | ORAL_TABLET | Freq: Three times a day (TID) | ORAL | 0 refills | Status: DC

## 2021-01-17 MED ORDER — FUROSEMIDE 40 MG PO TABS: 40.0000 mg | ORAL_TABLET | Freq: Every day | ORAL | 0 refills | Status: DC

## 2021-01-17 MED ORDER — SPIRONOLACTONE 25 MG PO TABS: 25.0000 mg | ORAL_TABLET | Freq: Every day | ORAL | 0 refills | Status: DC

## 2021-01-17 MED ORDER — TICAGRELOR 90 MG PO TABS: 90.0000 mg | ORAL_TABLET | Freq: Two times a day (BID) | ORAL | 0 refills | Status: DC

## 2021-01-17 MED ORDER — ATORVASTATIN CALCIUM 80 MG PO TABS: 80.0000 mg | ORAL_TABLET | Freq: Every day | ORAL | 0 refills | Status: DC

## 2021-01-17 NOTE — Progress Notes (Addendum)
PROGRESS NOTE   Subjective/Complaints:  No new issues, per CNA patient is supervision level to go to the bathroom with a walker.  He still is not using his right hand normally.  He is able stand at the sink to brush his teeth and maintain balance but does have a walker in front of him  ROS- neg CP, SOB, N/V/D  Objective:   IR Intra Cran Stent  Result Date: 01/15/2021 INDICATION: Speech difficulty, gait instability and bilateral lower extremity weakness. 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   EXAM: 1. EMERGENT LARGE VESSEL OCCLUSION THROMBOLYSIS (POSTERIOR CIRCULATION)   COMPARISON:  CT angiogram of the head and neck 01/09/2021.   MEDICATIONS: Ancef 2 g IV antibiotic was administered within 1 hour of the procedure.   ANESTHESIA/SEDATION: General anesthesia   CONTRAST:  Omnipaque 300 approximately 75 cc.   FLUOROSCOPY TIME:  Fluoroscopy Time: 92 minutes 40 seconds (7422 mGy).   COMPLICATIONS: None immediate.   TECHNIQUE: Patient's neurological status and imaging findings were reviewed and discussed with Dr. Theda Sers, on-call neuro hospitalist.   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 Reasons were then made for patient's transfer to [facility] for endovascular treatment of the occluded basilar artery.   Informed witness consent was not obtainable as the patient was intubated and under general anesthesia. No family members or next of kin were available via telephone or physically.   Given the neuroimaging findings, and patient's clinical condition, an emergent consent was initiated.   The patient was then put under general anesthesia by the Department of Anesthesiology at [facility].   The right groin was prepped and draped in the usual sterile fashion. Thereafter using modified Seldinger technique,  transfemoral access into the right common femoral artery was obtained without difficulty. Over a 0.035 inch guidewire a 5 [place] Pinnacle 25 cm sheath was inserted. Through this, and also over a 0.035 inch guidewire a 5 [place] JB 1 catheter was advanced to the aortic arch region and selectively positioned in the left common carotid artery, the left vertebral artery, the right common carotid artery and the right vertebral artery.   FINDINGS: Non dominant left vertebral artery demonstrates mild stenosis at its origin.   The vessel demonstrates slow ascent of contrast to the cranial skull base to complete occlusion at the level of C2. Partial opacification is noted of the left vertebrobasilar junction just distal to the origin of the left posterior-inferior cerebellar artery, the posterior spinal artery. Incomplete opacification is seen of the left posterior-inferior cerebellar artery. 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   On the AP projection there is central prominent vessel which projects superiorly and in the midline opacifying the confluence of the vertebrobasilar junctions and retrogradely the right vertebrobasilar junction with partial opacification of the proximal right posteroinferior cerebellar artery.   No clearance of contrast is noted distally.   The left common carotid arteriogram demonstrates patency of the left external carotid artery and its major branches.   The left internal carotid  artery at the bulb to the cranial skull base is widely patent.   The petrous, the cavernous and the supraclinoid segments are widely patent.   The left middle cerebral artery demonstrates a 50% stenosis in the mid M1 segment.   More distally the trifurcation branches are widely patent.   The left anterior cerebral artery opacifies into the capillary and venous phases.   The innominate arteriogram demonstrates patency of the  proximal right subclavian artery with moderate tortuosity. The origin of the right vertebral artery is noted with ascent of contrast to the cranial skull base. Right common carotid arteriogram demonstrates the right external carotid artery and its major branches to be widely patent.   The right internal carotid artery at the bulb to the cranial skull base is widely patent.   The intracranial portion was not examined.   PROCEDURE: The 5 French diagnostic catheter was then advanced over a 0.035 inch Roadrunner guidewire to the distal right subclavian artery and exchanged over a stiff Glidewire for a 95 cm Neuron Max sheath inside of which was a 115 cm 5 [place] Catalyst guide catheter.   The combination was then advanced and positioned just distal to the origin of the right vertebral artery.   The exchange wire was removed.   Combination of the 5 [place] Catalyst guide catheter and the Neuron Max sheath was then retrieved proximal to the origin of the right vertebral artery.   Multiple attempts were then made using 035 inch guidewires, 016 micro guidewire with 021 microcatheter, and 014 inch micro guidewire with the 021 micro catheters without success.   A radial access was then obtained using ultrasound guidance. Initially the morphology of the right radial artery was identified with ultrasound and documented.   A dorsal palmar anastomosis was verified to be present.   Over a 0.018 inch micro guidewire and a micropuncture set, access was obtained with ultrasound guidance with placement of a 6/7 French radial sheath. The micro guidewire and the obturator were removed. Good aspiration obtained from the side port of the radial sheath. A cocktail of 2000 units of heparin, and 2.5 mg of verapamil, and 200 mcg of nitroglycerin was then infused without event.   Over a 0.035 inch Roadrunner guidewire, a 5 [place] JB 1 catheter, JB 2 catheter and a JB 3 catheter were advanced and multiple attempts were made to cannulate the  right vertebral artery without success. This approach was then abandoned.   Another attempt was then made through the right common femoral artery puncture site.   A 5 [place] JB 3 diagnostic catheter was then advanced over a 0.035 inch glidewire to the distal right subclavian artery.   The guidewire was then gently retrieved proximally with the microcatheter retrieved until it engaged the origin of the right vertebral artery.   A 0.035 inch Roadrunner guidewire was then advanced more distally into the right vertebral artery followed by the [place] 3 catheter.   Good aspiration obtained from the hub of the diagnostic catheter following removal of the guidewire. A control arteriogram performed through the diagnostic catheter demonstrated slow ascent contrast to the vertebrobasilar junction with complete occlusion of the dominant right vertebral artery at the level of C1.   Over a 300 cm exchange 035 inch guidewire, combination of the Catalyst 115 cm guide catheter inside of an 8 [place] Neuron Max sheath was advanced to the distal right vertebral artery.   The guidewire was removed. Good aspiration obtained from the hub of  the 5 [place] Catalyst guide catheter.   A gentle contrast injection continued to demonstrate occluded dominant right vertebral artery at the level of C1.   At this time a combination of an 021 162 cm microcatheter and 014 inch Synchro standard micro guidewire with a moderate J configuration was advanced to the distal end of the 5 [place] Catalyst guide catheter.   Using a torque device, gentle advancement was performed of the micro guidewire followed by the microcatheter to the site of the occluded dominant right vertebrobasilar junction proximal to the dominant right posteroinferior cerebellar artery and distally just proximal to the basilar artery.   Eventually access with the micro guidewire was obtained followed by the microcatheter.   The micro guidewire was then advanced to the P2 region of  the left posterior cerebral artery followed by the microcatheter. The guidewire was removed. Good aspiration obtained from the hub of the catheter. Gentle control arteriogram demonstrated safe position of the tip of the microcatheter. This was then connected to continuous heparinized saline infusion.   A 4 mm x 40 mm Solitaire X retrieval device was then advanced to the distal end of the microcatheter. The O ring on the delivery microcatheter was loosened and the delivery microcatheter was retrieved deploying the retrieval device. The 5 [place] Catalyst guide catheter was now advanced to the occluded segment of the dominant right vertebral artery proximal to the basilar artery.   Thereafter constant aspiration was applied with a Penumbra aspiration device at the hub of the Catalyst guide catheter and with a 20 mL syringe at the hub of the Neuron Max sheath for approximately 3 minutes.   The combination of the retrieval device, the microcatheter, and the 5 [place] Catalyst guide catheter was retrieved and removed. Following removal, there was free aspiration of blood at the hub of the Neuron Max sheath at the level of the skull base. A control arteriogram performed now demonstrated revascularization of the right vertebrobasilar junction,the basilar artery, the posterior cerebral arteries, the superior cerebellar arteries and the left anterior inferior cerebellar artery. Also there was opacification of the right posterior-inferior cerebellar artery and retrograde flow into the left vertebrobasilar junction.   Moderate amount of thrombus was noted entangled in the struts of the retrieval device.   Moderate spasm was noted in the mid basilar artery,the proximal basilar artery and the right vertebrobasilar junction.   This responded to 2 aliquots of nitroglycerin intra-arterially.   Severe intracranial stenosis was noted at two sites at the right vertebrobasilar junction, one distal and one proximal to the right  posterior-inferior cerebellar artery.   Measurements were then performed of the distal right vertebrobasilar junction and the proximal right vertebrobasilar junction. It was decided to proceed with placement of a 3 mm x 22 mm Resolute Onyx stent. Over a 0.014 inch standard Synchro micro guidewire, an 021 160 cm microcatheter was advanced coaxially with a 5 [place] Catalyst guide catheter to the distal end of the Neuron Max sheath in the right vertebral artery.   Two areas of high-grade stenosis were then crossed with the micro guidewire followed by the microcatheter which was advanced to the distal basilar artery.   The micro guidewire was removed. Good aspiration obtained from the hub of the microcatheter. This in turn was replaced with a 300 cm 014 inch Zoom exchange micro guidewire.   The distal end of the Zoom guidewire had a mild J configuration. The 3 mm x 22 mm Resolute Onyx balloon mounted stent was then  retrogradely purged with heparinized saline infusion, and antegradely with 50% contrast and 50% heparinized saline infusion.   Using the rapid exchange technique, the balloon mountable stent delivery apparatus was advanced without difficulty and positioned adequate distance from the distal stenosis and proximal stenosis.   This was then deployed with a micro inflation syringe device via micro tubing to 12 atmospheres achieving a 3.05 mm diameter.   The balloon was deflated and retrieved and removed. A control arteriogram performed through the Catalyst guide catheter demonstrated significantly improved caliber and flow through the stented segment at the site of the proximal stenosis.   Suboptimal coverage was noted of the distal stenosis. This necessitated the placement of a second Resolute Onyx stent 3 mm x 15 mm. Having been prepped as described above, the stent apparatus was advanced and positioned such that the distal portion of the second stent was just proximal to the basilar artery.   Thereafter, this  was then deployed in the usual manner using micro inflation syringe device via micro tubing to over 3 mm at approximately 11 atmospheres. The balloon was deflated and retrieved and removed. A control arteriogram performed through the Catalyst guide catheter in the proximal right vertebrobasilar junction now demonstrated excellent flow through the right vertebral artery, the right vertebrobasilar junction, the posterior cerebral arteries, the superior cerebellar arteries and the left anterior inferior cerebellar artery. No significant flow was noted in the right anterior inferior cerebellar artery.   There was retrograde flow into the left vertebrobasilar junction. Control arteriograms were then performed at approximally 10 and 25 minutes after placement of a second stent. These continued to demonstrate excellent flow without evidence of intraluminal filling defects. A TICI 2C revascularization of the vertebrobasilar system was noted as the right anterior inferior cerebellar artery was now angiographically verified.   The 5 [place] Catalyst guide catheter was removed. The Neuron Max sheath was then retrieved into the proximal right vertebral artery. A final control arteriogram performed through this demonstrated wide patency of the right vertebral artery intracranially and extracranially with no change in the revascularized posterior fossa.   The 8 French Pinnacle sheath was removed. Manual compression was applied for hemostasis in the right groin puncture site. Distal pulses remained Dopplerable in both feet unchanged.   A CT scan of the brain obtained prior to the placement of the intracranial stents, and after continued to demonstrate no evidence of mass effect, midline shift or of intracranial hemorrhage.   Also, the patient was loaded with 180 mg of Brilinta orally via an OG tube prior to placement of the intracranial stents. Also right prior to placement of the stent, the patient was given a half loading dose of  Cangrelor IV followed by a 4 hour infusion. CT of the brain was to be obtained at 4 hours of the Cangrelor infusion.   The patient's general anesthesia was reversed. Patient was extubated. Upon recovery, the patient was able to obey simple commands appropriately. Denied any headaches or nausea.   The patient was able to move all four extremities. His range of movements was within normal limits.   He was then transferred to the neuro ICU for post revascularization management.   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   PLAN: Follow-up in clinic 2 weeks post discharge.     Electronically Signed   By: Luanne Bras M.D.   On: 01/11/2021 08:35  IR CT Head [facility]  Result Date: 01/15/2021 INDICATION: Speech difficulty, gait instability and bilateral lower extremity weakness. 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   EXAM: 1. EMERGENT LARGE VESSEL OCCLUSION THROMBOLYSIS (POSTERIOR CIRCULATION)   COMPARISON:  CT angiogram of the head and neck 01/09/2021.   MEDICATIONS: Ancef 2 g IV antibiotic was administered within 1 hour of the procedure.   ANESTHESIA/SEDATION: General anesthesia   CONTRAST:  Omnipaque 300 approximately 75 cc.   FLUOROSCOPY TIME:  Fluoroscopy Time: 92 minutes 40 seconds (7422 mGy).   COMPLICATIONS: None immediate.   TECHNIQUE: Patient's neurological status and imaging findings were reviewed and discussed with Dr. Theda Sers, on-call neuro hospitalist.   Patient was deemed appropriate for endovascular revascularization given the basilar artery occlusion confirmed on CT angiogram of the head and  neck. Reasons were then made for patient's transfer to [facility] for endovascular treatment of the occluded basilar artery.   Informed witness consent was not obtainable as the patient was intubated and under general anesthesia. No family members or next of kin were available via telephone or physically.   Given the neuroimaging findings, and patient's clinical condition, an emergent consent was initiated.   The patient was then put under general anesthesia by the Department of Anesthesiology at [facility].   The right groin was prepped and draped in the usual sterile fashion. Thereafter using modified Seldinger technique, transfemoral access into the right common femoral artery was obtained without difficulty. Over a 0.035 inch guidewire a 5 [place] Pinnacle 25 cm sheath was inserted. Through this, and also over a 0.035 inch guidewire a 5 [place] JB 1 catheter was advanced to the aortic arch region and selectively positioned in the left common carotid artery, the left vertebral artery, the right common carotid artery and the right vertebral artery.   FINDINGS: Non dominant left vertebral artery demonstrates mild stenosis at its origin.   The vessel demonstrates slow ascent of contrast to the cranial skull base to complete occlusion at the level of C2. Partial opacification is noted of the left vertebrobasilar junction just distal to the origin of the left posterior-inferior cerebellar artery, the posterior spinal artery. Incomplete opacification is seen of the left posterior-inferior cerebellar artery. 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   On the AP projection there is central prominent vessel which projects superiorly and in the midline opacifying the confluence of the vertebrobasilar junctions and retrogradely the right vertebrobasilar junction with partial opacification of the proximal right  posteroinferior cerebellar artery.   No clearance of contrast is noted distally.   The left common carotid arteriogram demonstrates patency of the left external carotid artery and its major branches.   The left internal carotid artery at the bulb to the cranial skull base is widely patent.   The petrous, the cavernous and the supraclinoid segments are widely patent.   The left middle cerebral artery demonstrates a 50% stenosis in the mid M1 segment.   More distally the trifurcation branches are widely patent.   The left anterior cerebral artery opacifies into the capillary and venous phases.   The innominate arteriogram demonstrates patency of the proximal right subclavian artery with moderate tortuosity. The origin of  the right vertebral artery is noted with ascent of contrast to the cranial skull base. Right common carotid arteriogram demonstrates the right external carotid artery and its major branches to be widely patent.   The right internal carotid artery at the bulb to the cranial skull base is widely patent.   The intracranial portion was not examined.   PROCEDURE: The 5 French diagnostic catheter was then advanced over a 0.035 inch Roadrunner guidewire to the distal right subclavian artery and exchanged over a stiff Glidewire for a 95 cm Neuron Max sheath inside of which was a 115 cm 5 [place] Catalyst guide catheter.   The combination was then advanced and positioned just distal to the origin of the right vertebral artery.   The exchange wire was removed.   Combination of the 5 [place] Catalyst guide catheter and the Neuron Max sheath was then retrieved proximal to the origin of the right vertebral artery.   Multiple attempts were then made using 035 inch guidewires, 016 micro guidewire with 021 microcatheter, and 014 inch micro guidewire with the 021 micro catheters without success.   A radial access was then obtained using ultrasound guidance. Initially the morphology of the right radial artery was  identified with ultrasound and documented.   A dorsal palmar anastomosis was verified to be present.   Over a 0.018 inch micro guidewire and a micropuncture set, access was obtained with ultrasound guidance with placement of a 6/7 French radial sheath. The micro guidewire and the obturator were removed. Good aspiration obtained from the side port of the radial sheath. A cocktail of 2000 units of heparin, and 2.5 mg of verapamil, and 200 mcg of nitroglycerin was then infused without event.   Over a 0.035 inch Roadrunner guidewire, a 5 [place] JB 1 catheter, JB 2 catheter and a JB 3 catheter were advanced and multiple attempts were made to cannulate the right vertebral artery without success. This approach was then abandoned.   Another attempt was then made through the right common femoral artery puncture site.   A 5 [place] JB 3 diagnostic catheter was then advanced over a 0.035 inch glidewire to the distal right subclavian artery.   The guidewire was then gently retrieved proximally with the microcatheter retrieved until it engaged the origin of the right vertebral artery.   A 0.035 inch Roadrunner guidewire was then advanced more distally into the right vertebral artery followed by the [place] 3 catheter.   Good aspiration obtained from the hub of the diagnostic catheter following removal of the guidewire. A control arteriogram performed through the diagnostic catheter demonstrated slow ascent contrast to the vertebrobasilar junction with complete occlusion of the dominant right vertebral artery at the level of C1.   Over a 300 cm exchange 035 inch guidewire, combination of the Catalyst 115 cm guide catheter inside of an 8 [place] Neuron Max sheath was advanced to the distal right vertebral artery.   The guidewire was removed. Good aspiration obtained from the hub of the 5 [place] Catalyst guide catheter.   A gentle contrast injection continued to demonstrate occluded dominant right vertebral artery at the level of  C1.   At this time a combination of an 021 162 cm microcatheter and 014 inch Synchro standard micro guidewire with a moderate J configuration was advanced to the distal end of the 5 [place] Catalyst guide catheter.   Using a torque device, gentle advancement was performed of the micro guidewire followed by the microcatheter to the site  of the occluded dominant right vertebrobasilar junction proximal to the dominant right posteroinferior cerebellar artery and distally just proximal to the basilar artery.   Eventually access with the micro guidewire was obtained followed by the microcatheter.   The micro guidewire was then advanced to the P2 region of the left posterior cerebral artery followed by the microcatheter. The guidewire was removed. Good aspiration obtained from the hub of the catheter. Gentle control arteriogram demonstrated safe position of the tip of the microcatheter. This was then connected to continuous heparinized saline infusion.   A 4 mm x 40 mm Solitaire X retrieval device was then advanced to the distal end of the microcatheter. The O ring on the delivery microcatheter was loosened and the delivery microcatheter was retrieved deploying the retrieval device. The 5 [place] Catalyst guide catheter was now advanced to the occluded segment of the dominant right vertebral artery proximal to the basilar artery.   Thereafter constant aspiration was applied with a Penumbra aspiration device at the hub of the Catalyst guide catheter and with a 20 mL syringe at the hub of the Neuron Max sheath for approximately 3 minutes.   The combination of the retrieval device, the microcatheter, and the 5 [place] Catalyst guide catheter was retrieved and removed. Following removal, there was free aspiration of blood at the hub of the Neuron Max sheath at the level of the skull base. A control arteriogram performed now demonstrated revascularization of the right vertebrobasilar junction,the basilar artery, the posterior  cerebral arteries, the superior cerebellar arteries and the left anterior inferior cerebellar artery. Also there was opacification of the right posterior-inferior cerebellar artery and retrograde flow into the left vertebrobasilar junction.   Moderate amount of thrombus was noted entangled in the struts of the retrieval device.   Moderate spasm was noted in the mid basilar artery,the proximal basilar artery and the right vertebrobasilar junction.   This responded to 2 aliquots of nitroglycerin intra-arterially.   Severe intracranial stenosis was noted at two sites at the right vertebrobasilar junction, one distal and one proximal to the right posterior-inferior cerebellar artery.   Measurements were then performed of the distal right vertebrobasilar junction and the proximal right vertebrobasilar junction. It was decided to proceed with placement of a 3 mm x 22 mm Resolute Onyx stent. Over a 0.014 inch standard Synchro micro guidewire, an 021 160 cm microcatheter was advanced coaxially with a 5 [place] Catalyst guide catheter to the distal end of the Neuron Max sheath in the right vertebral artery.   Two areas of high-grade stenosis were then crossed with the micro guidewire followed by the microcatheter which was advanced to the distal basilar artery.   The micro guidewire was removed. Good aspiration obtained from the hub of the microcatheter. This in turn was replaced with a 300 cm 014 inch Zoom exchange micro guidewire.   The distal end of the Zoom guidewire had a mild J configuration. The 3 mm x 22 mm Resolute Onyx balloon mounted stent was then retrogradely purged with heparinized saline infusion, and antegradely with 50% contrast and 50% heparinized saline infusion.   Using the rapid exchange technique, the balloon mountable stent delivery apparatus was advanced without difficulty and positioned adequate distance from the distal stenosis and proximal stenosis.   This was then deployed with a micro inflation  syringe device via micro tubing to 12 atmospheres achieving a 3.05 mm diameter.   The balloon was deflated and retrieved and removed. A control arteriogram performed through the Catalyst  guide catheter demonstrated significantly improved caliber and flow through the stented segment at the site of the proximal stenosis.   Suboptimal coverage was noted of the distal stenosis. This necessitated the placement of a second Resolute Onyx stent 3 mm x 15 mm. Having been prepped as described above, the stent apparatus was advanced and positioned such that the distal portion of the second stent was just proximal to the basilar artery.   Thereafter, this was then deployed in the usual manner using micro inflation syringe device via micro tubing to over 3 mm at approximately 11 atmospheres. The balloon was deflated and retrieved and removed. A control arteriogram performed through the Catalyst guide catheter in the proximal right vertebrobasilar junction now demonstrated excellent flow through the right vertebral artery, the right vertebrobasilar junction, the posterior cerebral arteries, the superior cerebellar arteries and the left anterior inferior cerebellar artery. No significant flow was noted in the right anterior inferior cerebellar artery.   There was retrograde flow into the left vertebrobasilar junction. Control arteriograms were then performed at approximally 10 and 25 minutes after placement of a second stent. These continued to demonstrate excellent flow without evidence of intraluminal filling defects. A TICI 2C revascularization of the vertebrobasilar system was noted as the right anterior inferior cerebellar artery was now angiographically verified.   The 5 [place] Catalyst guide catheter was removed. The Neuron Max sheath was then retrieved into the proximal right vertebral artery. A final control arteriogram performed through this demonstrated wide patency of the right vertebral artery intracranially and  extracranially with no change in the revascularized posterior fossa.   The 8 French Pinnacle sheath was removed. Manual compression was applied for hemostasis in the right groin puncture site. Distal pulses remained Dopplerable in both feet unchanged.   A CT scan of the brain obtained prior to the placement of the intracranial stents, and after continued to demonstrate no evidence of mass effect, midline shift or of intracranial hemorrhage.   Also, the patient was loaded with 180 mg of Brilinta orally via an OG tube prior to placement of the intracranial stents. Also right prior to placement of the stent, the patient was given a half loading dose of Cangrelor IV followed by a 4 hour infusion. CT of the brain was to be obtained at 4 hours of the Cangrelor infusion.   The patient's general anesthesia was reversed. Patient was extubated. Upon recovery, the patient was able to obey simple commands appropriately. Denied any headaches or nausea.   The patient was able to move all four extremities. His range of movements was within normal limits.   He was then transferred to the neuro ICU for post revascularization management.   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   PLAN: Follow-up in clinic 2 weeks post discharge.     Electronically Signed   By: Luanne Bras M.D.   On: 01/11/2021 08:35  Recent Labs    01/15/21 0518  WBC 8.1  HGB 17.1*  HCT 49.9  PLT 211    Recent Labs    01/15/21 0518 01/17/21 0503  NA 135 135  K 3.8 4.3  CL 101 102  CO2 25 20*  GLUCOSE 110* 100*  BUN 27* 27*  CREATININE 1.23 1.11  CALCIUM  9.1 9.1     Intake/Output Summary (Last 24 hours) at 01/17/2021 0858 Last data filed at 01/16/2021 1757 Gross per 24 hour  Intake 240 ml  Output 700 ml  Net -460 ml         Physical Exam: Vital Signs Blood pressure 118/83, pulse 72, temperature 98.2 F (36.8 C), temperature source Oral, resp. rate 18, height 6' (1.829 m), weight 112.1 kg, SpO2 93 %.  General: No acute distress Mood and affect are appropriate Heart: Regular rate and rhythm no rubs murmurs or extra sounds Lungs: Clear to auscultation, breathing unlabored, no rales or wheezes Abdomen: Positive bowel sounds, soft nontender to palpation, nondistended Extremities: No clubbing, cyanosis, or edema Skin: No evidence of breakdown, no evidence of rash Neurologic: Speech dysarthric no aphasia Cranial nerves II through XII intact, motor strength is 5/5 in bilateral deltoid, bicep, tricep, grip, hip flexor, knee extensors, ankle dorsiflexor and plantar flexor Sensory exam normal sensation to light touch  in bilateral upper and lower extremities Cerebellar exam normal finger to nose to finger as well as heel to shin in Left upper and lower extremities, mod dysmetria RUE , mild dysmetria RLE Positive dysdiadochokinesis with rapid alternating supination pronation of the right upper extremity. Musculoskeletal: Full range of motion in all 4 extremities. No joint swelling    Assessment/Plan: 1. Functional deficits which require 3+ hours per day of interdisciplinary therapy in a comprehensive inpatient rehab setting. Physiatrist is providing close team supervision and 24 hour management of active medical problems listed below. Physiatrist and rehab team continue to assess barriers to discharge/monitor patient progress toward functional and medical goals  Care Tool:  Bathing    Body parts bathed by patient: Right arm, Left arm, Chest, Abdomen, Front perineal area, Buttocks, Right upper leg, Left upper leg, Right lower leg, Left  lower leg, Face         Bathing assist Assist Level: Supervision/Verbal cueing     Upper Body Dressing/Undressing Upper body dressing   What is the patient wearing?: Pull over shirt    Upper body assist Assist Level: Supervision/Verbal cueing    Lower Body Dressing/Undressing Lower body dressing      What is the patient wearing?: Underwear/pull up, Pants     Lower body assist Assist for lower body dressing: Contact Guard/Touching assist     Toileting Toileting    Toileting assist Assist for toileting: Supervision/Verbal cueing Assistive Device Comment: urinal   Transfers Chair/bed transfer  Transfers assist     Chair/bed transfer assist level: Minimal Assistance - Patient > 75%     Locomotion Ambulation   Ambulation assist      Assist level: Minimal Assistance - Patient > 75% Assistive device: No Device Max distance: 262f   Walk 10 feet activity   Assist     Assist level: Contact Guard/Touching assist Assistive device: Hand held assist   Walk 50 feet activity   Assist    Assist level: Minimal Assistance - Patient > 75% Assistive device: Hand held assist    Walk 150 feet activity   Assist    Assist level: Minimal Assistance - Patient > 75% Assistive device: Hand held assist    Walk 10 feet on uneven surface  activity   Assist     Assist level: Minimal Assistance - Patient > 75% Assistive device: Hand held assist   Wheelchair     Assist Is the patient using  a wheelchair?: No             Wheelchair 50 feet with 2 turns activity    Assist            Wheelchair 150 feet activity     Assist          Blood pressure 118/83, pulse 72, temperature 98.2 F (36.8 C), temperature source Oral, resp. rate 18, height 6' (1.829 m), weight 112.1 kg, SpO2 93 %.  Medical Problem List and Plan: 1.  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 Formulized individual treatment plan based on medical history, underlying problem and comorbidities.  2.  Antithrombotics: -DVT/anticoagulation:  Pharmaceutical: Lovenox             -antiplatelet therapy: Aspirin and Brilinta for BA stent/stroke X 1 year (was given 9 sample bottles as well as paperwork to apply for assistance by radiology) 3. Pain Management: N/A 4. Mood: LCSW to follow for evaluation and support             -antipsychotic agents: N/A 5. Neuropsych: This patient is capable of making decisions on his own behalf. 6. Skin/Wound Care: Routine pressure-relief measures. 7. Fluids/Electrolytes/Nutrition: Monitor intake/output.  8.  HTN: Monitor blood pressures TID--continue Coreg 3.175m BID , Lasix 435mqd and spironolactone 2523md .  hydralazine 55m56mD started 10/23,  Vitals:   01/16/21 1940 01/17/21 0501  BP: (!) 137/92 118/83  Pulse: 73 72  Resp: 17 18  Temp: (!) 97.5 F (36.4 C) 98.2 F (36.8 C)  SpO2: 94% 93%    9.  CAD/systolic CHF w/ EF 35-484-66%eck weights daily and monitor for signs of overload. --Continue Coreg, Entresto, Lasix, spironolactone, Lipitor and ASA. Low fluid intake 120ml25morded with 900out , creat stable but will monitor  BMET to repeat in am given neg balance I/O, on diuretics 10.  Dyslipidemia: On high-dose Lipitor 80 mg daily 11.  Polycythemia: Will monitor with serial CBC. 12.  Obesity: Encourage weight loss as well as appropriate diet to help promote overall health and mobility. 13. Bradycardic: decrease Coreg to 3.125mg 26mrunning in 60s and 70s    LOS: 3 days A FACE TO FACE EVALUATION WAS PERFORMED  AndrewCharlett Blake/2022, 8:58 AM

## 2021-01-17 NOTE — IPOC Note (Signed)
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)     Functional Problem List: Nursing Safety, Medication Management, Endurance, Bowel  PT Balance, Behavior, Endurance, Motor, Perception, Safety, Sensory, Skin Integrity  OT Balance, Cognition, Endurance, Motor, Safety  SLP Cognition, Safety, Motor  TR         Basic ADL's: OT Grooming, Bathing, Dressing, Toileting     Advanced  ADL's: OT Simple Meal Preparation, Light Housekeeping     Transfers: PT Bed Mobility, Bed to Chair, Car, [employer], Civil Service fast streamer, Research scientist (life sciences): PT Ambulation, Stairs     Additional Impairments: OT None  SLP Social Cognition   Problem Solving, Memory, Attention, Awareness  TR      Anticipated Outcomes Item Anticipated Outcome  Self Feeding independent  Swallowing      Basic self-care  mod I / supervision  Toileting  mod I / supervision   Bathroom Transfers mod I / supervision  Bowel/Bladder  manage bowel w mod I assist  Transfers  ModI  Locomotion  spv  Communication     Cognition  Supervision  Pain  n/a  Safety/Judgment  maintain safety w cues/reminders   Therapy Plan: PT Intensity: Minimum of 1-2 x/day ,45 to 90 minutes PT Frequency: 5 out of 7 days PT Duration Estimated Length of Stay: 7-10 days OT Intensity: Minimum of 1-2 x/day, 45 to 90 minutes OT Frequency: 5 out of 7 days OT Duration/Estimated Length of Stay: 7-10 days SLP Intensity: Minumum of 1-2 x/day, 30 to 90 minutes SLP Frequency: 3 to 5 out of 7 days SLP Duration/Estimated Length of Stay: 7-10 days   Due to the current state of emergency, patients may not be receiving their 3-hours of Medicare-mandated therapy.   Team Interventions: Nursing Interventions Disease Management/Prevention,  Medication Management, Discharge Planning, Bowel Management, Patient/Family Education  PT interventions Ambulation/gait training, Discharge planning, Functional mobility training, Psychosocial support, Therapeutic Activities, Visual/perceptual remediation/compensation, Balance/vestibular training, Disease management/prevention, Neuromuscular re-education, Skin care/wound management, Therapeutic Exercise, Wheelchair propulsion/positioning, Cognitive remediation/compensation, DME/adaptive equipment instruction, Pain management, Splinting/orthotics, UE/LE Strength taining/ROM, Community reintegration, Development worker, international aid stimulation, Patient/family education, Museum/gallery curator, UE/LE Coordination activities  OT Interventions Warden/ranger, Neuromuscular re-education, Self Care/advanced ADL retraining, Cognitive remediation/compensation, DME/adaptive equipment instruction, UE/LE Strength taining/ROM, Patient/family education, UE/LE Coordination activities, Discharge planning, Functional mobility training, Therapeutic Activities  SLP Interventions Cognitive remediation/compensation, Internal/external aids, Therapeutic Activities, Therapeutic Exercise, Patient/family education, Cueing hierarchy, Functional tasks, Environmental controls  TR Interventions    SW/CM Interventions Discharge Planning, Psychosocial Support, Patient/Family Education, Disease Management/Prevention   Barriers to Discharge MD  Medical stability  Nursing Decreased caregiver support, Home environment access/layout 2 level 2 ste no rails w friends  PT Decreased caregiver support, Behavior very poor safety awareness  OT      SLP Decreased caregiver support, Lack of/limited family support    SW Community education officer for SNF coverage     Team Discharge Planning: Destination: PT-Home ,OT- Home , SLP-Home Projected Follow-up: PT-Home health PT, 24 hour supervision/assistance, OT-  Home health OT, SLP-Home Health SLP, Outpatient SLP,  24 hour supervision/assistance Projected Equipment Needs: PT-To be determined, OT- Tub/shower seat, SLP-None recommended by SLP Equipment Details: PT- , OT-  Patient/family involved in discharge planning: PT- Patient,  OT-Patient, SLP-Patient  MD ELOS: 7-10d Medical Rehab Prognosis:  Good Assessment: 60 year old male with  history of CAD s/p CABG, ICM with CHF, HTN who was admitted on 01/09/2021 from OSH with BLE weakness, difficulty walking and difficulty with speech due to basilar artery occlusion.  He was taken to IR suite immediately and underwent cerebral angiography to be laying occluded bilateral VV changes and basilar artery.  He underwent complete revascularization of VA and right UVJ with stent assisted angioplasty of right UVJ by Dr. Corliss Skains.  Post procedure CT head was negative for ICH.  MRI brain showed confluent right cerebellar ACA territory infarct with numerous small infarcts scattered throughout B-PCA including right lateral medullary, right midbrain, right thalamus, bilateral occipital lobe and bilateral cerebellar hemispheres as well as incidental findings of moderate chronic white matter disease, mild mass-effect on fourth ventricle and occasional punctate anterior circulation infarcts. MRA brain done revealing patent basilar artery without stenosis and patent bilateral AICA/SCA and PCA origin.   Patient on aspirin/Brilinta for stroke embolic from large vessel disease however cardioembolic source not ruled out.  Follow-up 2D echo showed EF of 35 to 40% with severe concentric LVH and LV with global hypokinesis. Cognitive evaluation showed mild dysarthria with mild cognitive impairment-SL UM scored 22 out of 30 as well as bouts of lability with tangential speech.  Therapy ongoing and patient with RLE ataxia with balance deficits, anxiety/fear as well as right upper extremity weakness affecting ADLs and mobility.  CIR was recommended due to functional decline. He currently has no  complaints.     See Team Conference Notes for weekly updates to the plan of care

## 2021-01-17 NOTE — Progress Notes (Signed)
Speech Language Pathology Daily Session Note  Patient Details  Name: Zykeem Bauserman MRN: [id number] Date of Birth: February 25, 1961  Today's Date: 01/17/2021 SLP Individual Time: 1300-1400 SLP Individual Time Calculation (min): 60 min  Short Term Goals: Week 1: SLP Short Term Goal 1 (Week 1): STG = LTG d/t ELOS  Skilled Therapeutic Interventions: Pt received semi-reclined in bed and agreeable to skilled ST intervention with focus on cognitive goals. SLP facilitated session by providing sup A verbal cues for medication label comprehension and medication safety scenarios. Patient perceived as 90%+ intelligibility at conversation level. Pt recalled speech intelligibility strategies with independence including reducing speech rate. Pt expressed he feels his speech is near baseline and he is typically a "fast talker". Patient was left in bed with alarm activated and immediate needs within reach at end of session. Continue per current plan of care.     Pain Pain Assessment Pain Scale: Faces Pain Score: 0-No pain  Therapy/Group: Individual Therapy  Tamala Ser 01/17/2021, 10:52 AM

## 2021-01-17 NOTE — Patient Care Conference (Signed)
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 Claudette Laws Social Worker Present: Lavera Guise, BSW Nurse Present: Chana Bode, RN PT Present: Casimiro Needle, PT OT Present: Perrin Maltese, OT SLP Present: Eilene Ghazi, SLP PPS Coordinator present : Edson Snowball, PT     Current Status/Progress Goal Weekly Team Focus  Bowel/Bladder   pt continent of B/B. LBM 01/13/2021  regular BMs every 3 days or less  Assess B/B every shift and PRN   Swallow/Nutrition/ Hydration             ADL's   Pt completes UB and LB selfcare at supervision level sit to stand.  Transfers are supervision without an assistive device.  He demonstrates slight RUE ataxia during functional use  modified independence  selfcare retraining, balance retraining, transfer training, DME education, therapeutic activities, therapeutic exercise, neuromuscular re-education   Mobility   supervision bed mobility, CGA/min assist sit<>stand and stand pivot transfers without AD, min assist gait up to 281ft without AD, min assist 12 steps using B HRs - impaired balance as noted by 40/56 on Berg and 12/30 on FGA  supervision/independent overall at ambulatory level  evaluation, activity tolerance, transfer training, dynamic gait training, dynamic standing  balance, activity tolerance, L LE coordination, pt education, stair navigation   Communication   pt speech ~75% due to rapid rate and decreased respiratory coordination  sup A  speech intelligiblity strategies   Safety/Cognition/ Behavioral Observations  Min A  sup A  reasoning, executive function, safety, problem solving,focused/sustained attention and delayed recall   Pain   Pt denies pain  pain scale <3/10  Assess pain every shift and PRN   Skin   skin intact  no new breakdown  assess skin every shift and PRN     Discharge Planning:  Discharginh home with friends able to provide 24/7 supervision   Team Discussion: Brain stem stroke post stenting. Right ataxia and fine motor deficits. BP improved and mild bradycardia noted.  Patient on target to meet rehab goals: yes, currently supervision overall. Needs light min assist without and assistive device up to 200'. Can manage 12 steps. Need min assist for cognitive deficits, recall, memory, reasoning, problem solving and executive functioning. Goals for discharge set for mod I assist.  *See Care Plan and progress notes for long and short-term goals.   Revisions to Treatment Plan:  Upgraded SLP goal to MOD I    Teaching Needs: Safety, medications, secondary risk management, dietary modifications, transfers, etc  Current Barriers to Discharge: Decreased caregiver support and Home enviroment access/layout  Possible Resolutions to Barriers: 24/7 supervision recommended Family education with friends     Medical Summary Current Status: Blood pressure control improving, right upper extremity motor control issues as well as dysmetria.  No pain issues  Barriers to Discharge: Other (comments);Decreased  family/caregiver support  Barriers to Discharge Comments: Safety with ambulation, fall risk Possible Resolutions to [employer] Focus: Continue rehab, identify caregivers at home   Continued Need for Acute Rehabilitation Level of  Care: The patient requires daily medical management by a physician with specialized training in physical medicine and rehabilitation for the following reasons: Direction of a multidisciplinary physical rehabilitation program to maximize functional independence : Yes Medical management of patient stability for increased activity during participation in an intensive rehabilitation regime.: Yes Analysis of laboratory values and/or radiology reports with any subsequent need for medication adjustment and/or medical intervention. : Yes   I attest that I was present, lead the team conference, and concur with the assessment and plan of the team.   Chana Bode B 01/17/2021, 3:59 PM

## 2021-01-17 NOTE — Progress Notes (Signed)
Patient education: Discussed medications with patient, needs reinforcement with names and reason for medication. Discussed life style changes and diet. Initialed material that was discussed.

## 2021-01-17 NOTE — Progress Notes (Signed)
Occupational Therapy Session Note  Patient Details  Name: Joshua [name] MRN: [id number] Date of Birth: 1960/05/10  Today's Date: 01/17/2021 OT Individual Time: 7989-2119 OT Individual Time Calculation (min): 75 min    Short Term Goals: Week 1:  OT Short Term Goal 1 (Week 1): STG = LTG due to estimated LOS  Skilled Therapeutic Interventions/Progress Updates:    Pt in bed to start session with transfer to the EOB with supervision.  He was then able to complete functional mobility around the room and to the shower with supervision and no device.  He undressed in standing in the bathroom with UE stability used on the wall while removing pants and underpants.  He was able to complete bathing in standing with limited washing of his lower legs and feet secondary to his preference.  No LOB noted while showering without use of the grab bars for support.  He was then able to stand to dry off and donn his underpants in standing at supervision with UE support on the wall as needed.  He ambulated out to the EOB with supervision to sit for donning his pants.  Finished dressing with ambulation down to the therapy gym with supervision and no device.  No LOB noted but decreased smoothness with placement of the RLE.  He was able to work on RUE coordination with small ball toss and catch.  He was able to complete catching with the RUE 50% of the time. When dropped, he was able to walk over and pick up the ball.  Progressed to completion of the [place] Peg Board with the right hand with increased time.  He was able to pick up pegs one at a time as well as translating to and from the palm with only occasional drops.  Next, had him ambulate down to the ortho gym where he used the UE Ergonometer in standing for 2 sets of 3 mins each Random Programming and RPMs kept at 26 or above.  HR at 87 with O2 sats at 96% on room air between sets.  Finished session with ambulation back to the room with supervision where he was left with  his call button and phone in reach and safety alarm in place and pt sitting in the recliner.    Therapy Documentation Precautions:  Precautions Precautions: Fall Precaution Comments: Rt sided ataxia Restrictions Weight Bearing Restrictions: No   Pain: Pain Assessment Pain Scale: Faces Pain Score: 0-No pain ADL: See Care Tool Section for some details of mobility and selfcare  Therapy/Group: Individual Therapy  Braylin Formby OTR/L 01/17/2021, 10:40 AM

## 2021-01-17 NOTE — Progress Notes (Signed)
Patient ID: Joshua [name], male   DOB: February 13, 1961, 60 y.o.   MRN: [id number] Team Conference Report to Patient/Family  Team Conference discussion was reviewed with the patient and caregiver, including goals, any changes in plan of care and target discharge date.  Patient and caregiver express understanding and are in agreement.  The patient has a target discharge date of 01/20/21.  SW met with patient, SLP present. Provided team conference updates. Patient Pleased with d/c date. Pt reports he doesn't like drinking much because he doesn't like bothering the stuff to use the bathroom. No additional questions or concerns, sw will continue to follow up.  Dyanne Iha 01/17/2021, 1:30 PM

## 2021-01-17 NOTE — Progress Notes (Signed)
Physical Therapy Session Note  Patient Details  Name: Joshua [name] MRN: [id number] Date of Birth: [birth year]  Today's Date: 01/17/2021 PT Individual Time: 1445-1555 PT Individual Time Calculation (min): 70 min   Short Term Goals: Week 1:  PT Short Term Goal 1 (Week 1): STG= LTG based on ELOS  Skilled Therapeutic Interventions/Progress Updates:    Patient received sitting up in bed, agreeable to PT. He denies pain. Requesting to use bathroom- distant supervision provided. Patient able to ambulate to therapy gym with no AD and distant supervision. No LOB noted, but slight circumduction of R LE. Patient with tangential story about work responsibilities- worried he won't be able to return to work. Patient reports concerns about carrying heavy objects- he ambulated 4x60ft carrying increasingly heavy basket up to 30# with no LOB or difficulty. Tall kneeling constructing blocks based on picture. Appropriate activation of hip stabilizers noted and no assist needed to construct blocks. Patient able to demonstrate appropriate anticipatory and reactionary balance tossing ball at rebounder, WBOS, NBOS, repeat on foam surface. Modified D1 LE PNF with 4# ankle weight to R LE onto 2nd step with B UE support on hand rails 3x10. Patient ambulating to dayroom with reduced evidence of circumduction. Patient completing ball roll out 2 x20 for emphasis on anterior weight shift. Isometric core ball press downs-> trunk rotation with 3# dowel. NuStep completed x10 mins with emphasis on R knee moving in single plane. Patient ambulating back to his room with no AD and close supervision. Slight deviation from straight path noted, but no overt LOB. Returning to bed, bed alarm on, call light within reach.   Therapy Documentation Precautions:  Precautions Precautions: Fall Precaution Comments: Rt sided ataxia Restrictions Weight Bearing Restrictions: No    Therapy/Group: Individual Therapy  Elizebeth Koller, PT, DPT, CBIS  01/17/2021, 7:37 AM

## 2021-01-17 NOTE — Discharge Instructions (Addendum)
Inpatient Rehab Discharge Instructions  Joshua [name] Discharge date and time: No discharge date for patient encounter.   Activities/Precautions/ Functional Status: Activity: activity as tolerated Diet: regular diet Wound Care: Routine skin checks Functional status:  ___ No restrictions     ___ Walk up steps independently ___ 24/7 supervision/assistance   ___ Walk up steps with assistance ___ Intermittent supervision/assistance  ___ Bathe/dress independently ___ Walk with walker     _x__ Bathe/dress with assistance ___ Walk Independently    ___ Shower independently ___ Walk with assistance    ___ Shower with assistance ___ No alcohol     ___ Return to work/school ________  COMMUNITY REFERRALS UPON DISCHARGE:    Outpatient: PT     OT    S            Agency:[facility] Phone: [phone]              Appointment Date/Time: TBD    Special Instructions: No driving smoking or alcoholSTROKE/TIA DISCHARGE INSTRUCTIONS SMOKING Cigarette smoking nearly doubles your risk of having a stroke & is the single most alterable risk factor  If you smoke or have smoked in the last 12 months, you are advised to quit smoking for your health. Most of the excess cardiovascular risk related to smoking disappears within a year of stopping. Ask you doctor about anti-smoking medications [facility] Quit Line: 1-800-QUIT NOW Free Smoking Cessation Classes [phone]  CHOLESTEROL Know your levels; limit fat & cholesterol in your diet  Lipid Panel     Component Value Date/Time   CHOL 194 01/10/2021 0449   TRIG 205 (H) 01/10/2021 0449   HDL 30 (L) 01/10/2021 0449   CHOLHDL 6.5 01/10/2021 0449   VLDL 41 (H) 01/10/2021 0449   LDLCALC 123 (H) 01/10/2021 0449     Many patients benefit from treatment even if their cholesterol is at goal. Goal: Total Cholesterol (CHOL) less than 160 Goal:  Triglycerides (TRIG) less than 150 Goal:  HDL greater than 40 Goal:  LDL (LDLCALC) less than 100   BLOOD PRESSURE  American Stroke Association blood pressure target is less that 120/80 mm/Hg  Your discharge blood pressure is:  BP: 118/83 Monitor your blood pressure Limit your salt and alcohol intake Many individuals will require more than one medication for high blood pressure  DIABETES (A1c is a blood sugar average for last 3 months) Goal HGBA1c is under 7% (HBGA1c is blood sugar average for last 3 months)  Diabetes: No known diagnosis of diabetes    Lab Results  Component Value Date   HGBA1C 5.3 01/10/2021    Your HGBA1c can be lowered with medications, healthy diet, and exercise. Check your blood sugar as directed by your physician Call your physician if you experience unexplained or low blood sugars.  PHYSICAL ACTIVITY/REHABILITATION Goal is 30 minutes at least 4 days per week  Activity: Increase activity slowly, Therapies: Physical Therapy: Home Health Return to work:  Activity decreases your risk of heart attack and stroke and makes your heart stronger.  It helps control your weight and blood pressure; helps you relax and can improve your mood. Participate in a regular exercise program. Talk with your doctor about the best form of exercise for you (dancing, walking, swimming, cycling).  DIET/WEIGHT Goal is to maintain a healthy weight  Your discharge diet is:  Diet Order             Diet Heart Room service appropriate? Yes with Assist; Fluid consistency: Thin  Diet effective now                   liquids Your height is:  Height: 6' (182.9 cm) Your current weight is: Weight: 112.1 kg Your Body Mass Index (BMI) is:  BMI (Calculated): 33.51 Following the type of diet specifically designed for you will help prevent another stroke. Your goal weight range is:   Your goal Body Mass Index (BMI) is 19-24. Healthy food habits can help reduce 3 risk factors for stroke:  High cholesterol, hypertension, and excess weight.  RESOURCES Stroke/Support Group:  Call [phone]   STROKE EDUCATION  PROVIDED/REVIEWED AND GIVEN TO PATIENT Stroke warning signs and symptoms How to activate emergency medical system (call 911). Medications prescribed at discharge. Need for follow-up after discharge. Personal risk factors for stroke. Pneumonia vaccine given: No Flu vaccine given: No My questions have been answered, the writing is legible, and I understand these instructions.  I will adhere to these goals & educational materials that have been provided to me after my discharge from the hospital.       My questions have been answered and I understand these instructions. I will adhere to these goals and the provided educational materials after my discharge from the hospital.  Patient/Caregiver Signature _______________________________ Date __________  Clinician Signature _______________________________________ Date __________  Please bring this form and your medication list with you to all your follow-up doctor's appointments.

## 2021-01-18 NOTE — Progress Notes (Signed)
Patient ID: Joshua [name], male   DOB: 01/21/1961, 60 y.o.   MRN: [id number]  Neuro OP order faxed

## 2021-01-18 NOTE — Progress Notes (Signed)
Physical Therapy Session Note  Patient Details  Name: Joshua [name] MRN: [id number] Date of Birth: 1960-12-24  Today's Date: 01/18/2021 PT Individual Time: 1400-1425 PT Individual Time Calculation (min): 25 min   Short Term Goals: Week 1:  PT Short Term Goal 1 (Week 1): STG= LTG based on ELOS  Skilled Therapeutic Interventions/Progress Updates:     Pt supine in bed to start session. He's agreeable to PT treatment with no reports of pain. He's somewhat irritable and is very adamant that therapist not touch him or guard with belt, but was agreeable to wearing belt.   Pt completed bed mobility and sit<>stand transfers indep without cues or assist. Ambulates with distant supervision in hallways and worked on dynamic gait with tossing and bouncing the ball in hallways to challenge balance, coordination, and reaction timing. Pt also able to pick up the ball from floor with supervision assist and no evidence of LOB. While completing dynamic gait tasks during session and therapist providing CGA with belt, he grabbed therapist's hand and somewhat aggressively threw therapist's hand off of him, stating "don't hold on to me."   Pt worked on balance in // bars with rocker board and supervision assist. Rocker board in sagittal plane vs frontal plane to challenge balance with posterior bias noted and VC provided for placing increased weight through forefoot which improved stability.   Ambulated back to his room with distant supervision and no AD, completed session seated EOB with all needs in reach and bed alarm on.   Therapy Documentation Precautions:  Precautions Precautions: Fall Precaution Comments: Rt sided ataxia Restrictions Weight Bearing Restrictions: No General:    Therapy/Group: Individual Therapy  Joshua [name] 01/18/2021, 7:31 AM

## 2021-01-18 NOTE — Progress Notes (Signed)
Occupational Therapy Session Note  Patient Details  Name: Joshua [name] MRN: [id number] Date of Birth: 12-10-1960  Today's Date: 01/18/2021 OT Individual Time: 2992-4268 OT Individual Time Calculation (min): 55 min    Short Term Goals: Week 1:  OT Short Term Goal 1 (Week 1): STG = LTG due to estimated LOS  Skilled Therapeutic Interventions/Progress Updates:    Pt in bed to start agreeable to session.  He was able to complete selfcare retraining at shower level to begin session.  Supervision for removal of all clothing in standing, except for gripper socks, which he sat down to remove.  He was then able to complete all showering with distant supervision and no LOB.  Once he dried off, he was able to complete dressing in sit to stand from the EOB.  He then cleaned up all supplies and towels in the floor at the same supervision level and no assistive device.  Once complete, he was able to sit on the EOB and work on Museum/gallery conservator.  When attempting to write his name with the RUE and in cursive, it was very small and sloppy compared to baseline.  Had him work on [employer] taking up two lines of the paper to maintain larger size.  This was more legible, however he still demonstrates difficulty stopping and starting separate lines neatly.  No ataxia noted with RUE stabilized on the rolling table while completing letters.  Encouraged pt to continue working on handwriting on his own and provided lined paper for him.  Finished session with pt laying in the bed and with the call button and phone in reach.  Safety alarm in place as well.   Therapy Documentation Precautions:  Precautions Precautions: Fall Precaution Comments: Rt sided ataxia Restrictions Weight Bearing Restrictions: No  Pain: Pain Assessment Pain Scale: Faces Pain Score: 0-No pain ADL: See Care Tool Section for some details of mobility and selfcare  Therapy/Group: Individual Therapy  Nassir Neidert OTR/L 01/18/2021, 3:49 PM

## 2021-01-18 NOTE — Progress Notes (Signed)
Physical Therapy Session Note  Patient Details  Name: Joshua [name] MRN: [id number] Date of Birth: [birth year]  Today's Date: 01/18/2021 PT Individual Time: 1100-1200 PT Individual Time Calculation (min): 60 min   Short Term Goals: Week 1:  PT Short Term Goal 1 (Week 1): STG= LTG based on ELOS Week 2:    Week 3:     Skilled Therapeutic Interventions/Progress Updates:    Pt initially supine, somewhat agitated c/o "work" calling him and asking when he will be back.  Perseverates on this throughout session, irritated mood but not aggressive.  Adamant therapist not touch him or guard w/belt but did agree to wearing belt.    Gait 150-359ft w/supervision, mild sway w/decreased knee felxion L thru swing but no lob.  Deviations decrease w/increased gait distance during session.  Functional gait:  ramps, mulch, curbs multiple passes all w/supervision Stairs:  ascends/descends12stairs w/2 rails step over step gait wsupervision.  Floor transfer w/supervision only using chair to assist w/transition.  Functional gait/dual task gait while passing ball between hands and counting up and down to/from 30 w/supervision, slight slowing of gait but no instability.  Retrogait, sidestepping, gait at varying speeds, sudden stops and starts, pivot turns, cervical nods/turns during gait all w/supervision, no balance loss.  Total distance >62ft x 2.  At end of session, pt left Pt left supine w/rails up x 3, alarm set, bed in lowest position, and needs in reach.    Therapy Documentation Precautions:  Precautions Precautions: Fall Precaution Comments: Rt sided ataxia Restrictions Weight Bearing Restrictions: No     Therapy/Group: Individual Therapy Rada Hay, PT   Shearon Balo 01/18/2021, 12:25 PM

## 2021-01-18 NOTE — Progress Notes (Addendum)
Speech Language Pathology Daily Session Note  Patient Details  Name: Joshua [name] MRN: [id number] Date of Birth: Dec 25, 1960  Today's Date: 01/18/2021 SLP Individual Time: 0900-1000 SLP Individual Time Calculation (min): 60 min  Short Term Goals: Week 1: SLP Short Term Goal 1 (Week 1): STG = LTG d/t ELOS  Skilled Therapeutic Interventions: Pt received supine in bed and agreeable to skilled ST intervention with focus on cognitive goals. Pt requested to go to the bathroom and displayed frustration when SLP suggested to use RW per safety plan as well as wearing gait belt. Pt refused to use rolling walker and stated "I'm not going to use it at home" and stated "take this belt off me" in a very stern manner. SLP informed pt of policy to use gait belt for patient's safety and he was reluctant yet agreeable. He proceeded to ambulate without walker to bathroom with mod I and then ambulated to bed for remainder of session. Pt consented to continue session for education and discussion on medication management. Pt organized TID pillbox with sup A verbal cues and self corrected error x1. Halfway into task pt demonstrated verbal agitation and stated "I don't like you being the boss" and "I like being the boss." SLP expressed to pt the importance of collaboration and successfully verbally redirected. SLP discontinued task in effort to minimize agitation. Pt proceeded to apologize. It was at this time pt received a call from his care partner who stated they would be unable to pick him up on discharge date (10/29) as they will be at the beach but that pt would be able to stay with his friend [name]. Pt agreeable to SLP notifying team for continued discharge planning. SLP notified SW/PT/OT via secure chat. Patient was left in bed with alarm activated and immediate needs within reach at end of session. Continue per current plan of care.      Pain Pain Assessment Pain Scale: 0-10 Pain Score: 0-No pain Pain Location:  Generalized Pain Descriptors / Indicators: Sore Pain Intervention(s): Medication (See eMAR) (tylenol given)  Therapy/Group: Individual Therapy  Tamala Ser 01/18/2021, 9:57 AM

## 2021-01-18 NOTE — Progress Notes (Signed)
Speech Language Pathology Discharge Summary  Patient Details  Name: Joshua [name] MRN: [id number] Date of Birth: 1961-01-13  Today's Date: 01/19/2021 SLP Individual Time: 1000-1030 SLP Individual Time Calculation (min): 30 min and Today's Date: 01/19/2021 SLP Missed Time: 30 Minutes Missed Time Reason: Patient unwilling to participate;Other (Comment) (pt emotional)  Patient has met 4 of 4 long term goals.  Patient to discharge at overall Supervision level.  Reasons goals not met:   NA  Clinical Impression/Discharge Summary:  Patient has made functional gains and has met 4 of 4 long-term goals this admission due to improved intellectual and emergent awareness, short-term recall with use of compensatory aids, and functional problem solving. Patient is currently an overall supervision assist for cognitive tasks. Education is complete and patient to discharge at overall supervision level. Patient's care partner is independent to provide the necessary physical and cognitive assistance at discharge. Recommend follow-up SLP services due to short CIR length of stay and to maximize cognitive function and functional independence.  Care Partner:  Caregiver Able to Provide Assistance: Yes  Type of Caregiver Assistance: Physical;Cognitive  Recommendation:  Outpatient SLP  Rationale for SLP Follow Up: Maximize cognitive function and independence   Equipment: none  Reasons for discharge: Treatment goals met;Discharged from hospital   Patient/Family Agrees with Progress Made and Goals Achieved: Yes   Skilled Therapeutic Interventions: Pt received supine in bed and agreeable to skilled ST intervention with focus on cognitive goals. Soon after session began patient exhibited irritability and verbal agitation although unclear of the cause. Pt reported increased stress surrounding "getting old" and being unsure if he will go back to work. Due to lower frustration tolerance today SLP facilitated unstructured  functional conversation related to discharge and safety. Pt agreeable for education on "Living a brain healthy lifestyle" handout with emphasis on brain health using acronym D.A.N.C.E.R.S (I.e., disease management, activity, nutrition, cognitive stimulation, engagement socially, relaxation, and sleep hygiene per neuro speech solutions. Patient verbalized understanding by stating some things he has in mind to keep him active such as walking up and down the street and ensuring he is taking his medication as prescribed to the best of his ability. Pt became emotional regarding health changes and stated "I don't want to talk anymore" and SLP concluded session. Pt missed 30 minutes of skilled ST therapy. Patient was left in bed with immediate needs within reach at end of session. Pt made mod I in room today thus alarm was not activated. Continue with planned discharge to home scheduled 01/20/2021.   Keanu Lesniak T Birtha Hatler 01/18/2021, 4:30 PM

## 2021-01-18 NOTE — Progress Notes (Signed)
Had an uneventful night. Denied pain. Slept well through the night. Assisted X2 for bathroom privileges. Hemodynamically stable. Safety maintained at all times.

## 2021-01-18 NOTE — Progress Notes (Signed)
Inpatient Rehabilitation Discharge Medication Review by a Pharmacist  A complete drug regimen review was completed for this patient to identify any potential clinically significant medication issues.   High Risk Drug Classes Is patient taking? Indication by Medication  Antipsychotic No   Anticoagulant No   Antibiotic No   Opioid No   Antiplatelet Yes Aspirin and ticagrelor for stroke prophylaxis   Hypoglycemics/insulin No   Vasoactive Medication Yes Carvedilol and spironolactone for HFrEF Hydralazine for HTN   Chemotherapy No   Other No      Type of Medication Issue Identified Description of Issue Recommendation(s)  Drug Interaction(s) (clinically significant)     Duplicate Therapy     Allergy     No Medication Administration End Date     Incorrect Dose     Additional Drug Therapy Needed     Significant med changes from prior encounter (inform family/care partners about these prior to discharge). New start atorvastatin, carvedilol, furosemide, hydralazine, spironolactone, ticagrelor  Educate patient on all new medications  Continue aspirin + Ticagrelor until neurology follow up   Other       Clinically significant medication issues were identified that warrant physician communication and completion of prescribed/recommended actions by midnight of the next day:  No  Name of provider notified for urgent issues identified:   Provider Method of Notification:      Time spent performing this drug regimen review (minutes):  15  Alphia Moh, PharmD, [place], [facility] Clinical Pharmacist  Please check AMION for all [facility] Pharmacy phone numbers After 10:00 PM, call Main Pharmacy [phone]

## 2021-01-18 NOTE — Progress Notes (Signed)
Patient ID: Joshua [name], male   DOB: 1960/12/12, 60 y.o.   MRN: [id number] Follow up with the patient regarding pending discharge. Reviewed comfort level with discharge. Patient pleased with his progress and felt able to manage secondary stroke risks. Patient noted he was so ready for discharge and had no concerns about going home.[name]

## 2021-01-18 NOTE — Progress Notes (Signed)
PROGRESS NOTE   Subjective/Complaints:  Patient slept well last night.  No pain complaints other than usual aches and pains associated with "old age"  ROS- neg CP, SOB, N/V/D  Objective:   No results found. No results for input(s): WBC, HGB, HCT, PLT in the last 72 hours.  Recent Labs    01/17/21 0503  NA 135  K 4.3  CL 102  CO2 20*  GLUCOSE 100*  BUN 27*  CREATININE 1.11  CALCIUM 9.1     Intake/Output Summary (Last 24 hours) at 01/18/2021 0830 Last data filed at 01/17/2021 1837 Gross per 24 hour  Intake 960 ml  Output --  Net 960 ml         Physical Exam: Vital Signs Blood pressure 138/90, pulse 73, temperature 97.7 F (36.5 C), resp. rate 18, height 6' (1.829 m), weight 112.1 kg, SpO2 97 %.  General: No acute distress Mood and affect are appropriate Heart: Regular rate and rhythm no rubs murmurs or extra sounds Lungs: Clear to auscultation, breathing unlabored, no rales or wheezes Abdomen: Positive bowel sounds, soft nontender to palpation, nondistended Extremities: No clubbing, cyanosis, or edema Skin: No evidence of breakdown, no evidence of rash  Neurologic: Speech dysarthric no aphasia Cranial nerves II through XII intact, motor strength is 5/5 in bilateral deltoid, bicep, tricep, grip, hip flexor, knee extensors, ankle dorsiflexor and plantar flexor Sensory exam normal sensation to light touch  in bilateral upper and lower extremities Cerebellar exam normal finger to nose to finger as well as heel to shin in Left upper and lower extremities, mod dysmetria RUE , mild dysmetria RLE Positive dysdiadochokinesis with rapid alternating supination pronation of the right upper extremity. Musculoskeletal: Full range of motion in all 4 extremities. No joint swelling    Assessment/Plan: 1. Functional deficits which require 3+ hours per day of interdisciplinary therapy in a comprehensive inpatient rehab  setting. Physiatrist is providing close team supervision and 24 hour management of active medical problems listed below. Physiatrist and rehab team continue to assess barriers to discharge/monitor patient progress toward functional and medical goals  Care Tool:  Bathing    Body parts bathed by patient: Right arm, Left arm, Chest, Abdomen, Front perineal area, Buttocks, Right upper leg, Left upper leg, Right lower leg, Left lower leg, Face         Bathing assist Assist Level: Supervision/Verbal cueing     Upper Body Dressing/Undressing Upper body dressing   What is the patient wearing?: Pull over shirt    Upper body assist Assist Level: Supervision/Verbal cueing    Lower Body Dressing/Undressing Lower body dressing      What is the patient wearing?: Underwear/pull up, Pants     Lower body assist Assist for lower body dressing: Contact Guard/Touching assist     Toileting Toileting    Toileting assist Assist for toileting: Supervision/Verbal cueing Assistive Device Comment: urinal   Transfers Chair/bed transfer  Transfers assist     Chair/bed transfer assist level: Minimal Assistance - Patient > 75%     Locomotion Ambulation   Ambulation assist      Assist level: Minimal Assistance - Patient > 75% Assistive device: No Device Max  distance: 233ft   Walk 10 feet activity   Assist     Assist level: Contact Guard/Touching assist Assistive device: Hand held assist   Walk 50 feet activity   Assist    Assist level: Minimal Assistance - Patient > 75% Assistive device: Hand held assist    Walk 150 feet activity   Assist    Assist level: Minimal Assistance - Patient > 75% Assistive device: Hand held assist    Walk 10 feet on uneven surface  activity   Assist     Assist level: Minimal Assistance - Patient > 75% Assistive device: Hand held assist   Wheelchair     Assist Is the patient using a wheelchair?: No              Wheelchair 50 feet with 2 turns activity    Assist            Wheelchair 150 feet activity     Assist          Blood pressure 138/90, pulse 73, temperature 97.7 F (36.5 C), resp. rate 18, height 6' (1.829 m), weight 112.1 kg, SpO2 97 %.  Medical Problem List and Plan: 1.  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  Antithrombotics: -DVT/anticoagulation:  Pharmaceutical: Lovenox             -antiplatelet therapy: Aspirin and Brilinta for BA stent/stroke X 1 year (was given 9 sample bottles as well as paperwork to apply for assistance by radiology) 3. Pain Management: N/A 4. Mood: LCSW to follow for evaluation and support             -antipsychotic agents: N/A 5. Neuropsych: This patient is capable of making decisions on his own behalf. 6. Skin/Wound Care: Routine pressure-relief measures. 7. Fluids/Electrolytes/Nutrition: Monitor intake/output.  8.  HTN: Monitor blood pressures TID--continue Coreg 3.125mg  BID , Lasix 40mg  qd and spironolactone 25mg  qd .  hydralazine 10mg  TID started 10/23,  Vitals:   01/17/21 1923 01/18/21 0348  BP: 135/79 138/90  Pulse: 75 73  Resp: 18 18  Temp: 98.1 F (36.7 C) 97.7 F (36.5 C)  SpO2: 99% 97%  Blood pressure well controlled 10/27  9.  CAD/systolic CHF w/ EF 35-40%: Check weights daily and monitor for signs of overload. --Continue Coreg, Entresto, Lasix, spironolactone, Lipitor and ASA. Low fluid intake 01/19/21 recorded with 900out , creat stable but will monitor  BMET normal with the exception of borderline high BUN 10.  Dyslipidemia: On high-dose Lipitor 80 mg daily 11.  Polycythemia: Will monitor with serial CBC. 12.  Obesity: Encourage weight loss as well as appropriate diet to help promote overall health  and mobility. 13. Bradycardic: decrease Coreg to 3.125mg  BID running in 60s and 70s    LOS: 4 days A FACE TO FACE EVALUATION WAS PERFORMED  01/20/21 01/18/2021, 8:30 AM

## 2021-01-18 NOTE — Progress Notes (Signed)
Inpatient Rehabilitation Care Coordinator Discharge Note   Patient Details  Name: Joshua [name] MRN: [id number] Date of Birth: Sep 25, 1960   Discharge location: Home  Length of Stay: 4 Days  Discharge activity level: Mod I/Sup  Home/community participation: Friends  Patient response [id number] Literacy - How often do you need to have someone help you when you read instructions, pamphlets, or other written material from your doctor or pharmacy?: Rarely  Patient response [id number]:[id number] Isolation - How often do you feel lonely or isolated from those around you?: Never  Services provided included: MD, RD, PT, OT, SLP, CM, RN, TR, Pharmacy, SW  Financial Services:  Field seismologist Utilized: [employer]  Choices offered to/list presented to: patient  Follow-up services arranged:  Outpatient    Outpatient Servicies: Neuro OP      Patient response to transportation need: Is the patient able to respond to transportation needs?: Yes In the past 12 months, has lack of transportation kept you from medical appointments or from getting medications?: No In the past 12 months, has lack of transportation kept you from meetings, work, or from getting things needed for daily living?: No    Comments (or additional information):  Patient/Family verbalized understanding of follow-up arrangements:  Yes  Individual responsible for coordination of the follow-up plan: patient  Confirmed correct DME delivered: Andria Rhein 01/18/2021    Andria Rhein

## 2021-01-19 NOTE — Progress Notes (Signed)
Occupational Therapy Discharge Summary  Patient Details  Name: Joshua [name] MRN: [id number] Date of Birth: [birth year]  Today's Date: 01/19/2021 OT Individual Time: 0800-0900 OT Individual Time Calculation (min): 60 min   Session 1: (0800-0900)  Pt with increased back pain reported during session, with nursing notified and pain medicine given by nursing.  He was able to complete shower and dressing during session at independent level using the walk-in shower.  He was able to ambulate throughout the room as well as during session at independent level, no assistive device.  He was able to dress in standing with unilateral UE support when donning underpants and pants.  Grooming tasks completed at the sink as well in standing with independence.  He was able to complete functional mobility down to the therapy gym where he used the Biodex for ankle and hip strategies.  He completed Limits of Stability Program 27% on the first set and then 32% on the second.  He also completed Random Control Program for 2 mins at 80% accuracy. Finished session with return to the room where he was made independent.  He was left sitting in the recliner with the call button and phone in reach.    Session 2: (1103-1200)  Pt completed functional mobility to the dayroom with independence for minimal participation in fall festival activities.  He then ambulated down to the tub/shower room where he completed step in shower transfer with independence.  Next, had him complete RUE coordination tasks while standing at the [place].  He was able to complete Visual Scanning in an average of 1.2 seconds and 85% accuracy.  Visual Pursuits was also completed at 89% accuracy and 2.4 seconds average for 25 number sequence.  Progressed to use of the Geoboards and Trail Making task as well with good accuracy using the RUE.  Still with slight ataxia noted.  Educated pt on FM coordination tasks for completion at home such as ball toss and catch using his  squeeze ball in his room, handwriting, sorting change with one hand, and washing the cabinets and walls with the RUE for weightbearing.  Finished session with return to the room and pt left sitting up in the recliner for lunch.  Call button and phone in reach.    Patient has met 11 of 11 long term goals due to improved activity tolerance, improved balance, ability to compensate for deficits, functional use of  RIGHT upper and RIGHT lower extremity, improved awareness, and improved coordination.  Patient to discharge at overall Independent level.  Patient's care partner is independent to provide the necessary physical and cognitive assistance at discharge.    Reasons goals not met: NA  Recommendation:  Patient will benefit from ongoing skilled OT services in outpatient setting to continue to advance functional skills in the area of BADL.  Feel pt will benefit from outpatient OT to continue progression with RUE coordination as well as higher level attention tasks in preparation for return to work.      Equipment: No equipment provided  Reasons for discharge: treatment goals met and discharge from hospital  Patient/family agrees with progress made and goals achieved: Yes  OT Discharge Precautions/Restrictions  Precautions Precautions: Fall Precaution Comments: Rt sided ataxia Restrictions Weight Bearing Restrictions: No   Pain Pain Assessment Pain Scale: 0-10 Pain Score: 5  Pain Type: Acute pain Pain Location: Back Pain Descriptors / Indicators: Aching Pain Frequency: Intermittent Pain Onset: With Activity Pain Intervention(s): Medication (See eMAR) ADL ADL Eating: Independent Where Assessed-Eating: Chair Grooming:  Independent Where Assessed-Grooming: Standing at sink Upper Body Bathing: Independent Where Assessed-Upper Body Bathing: Chair Lower Body Bathing: Independent Where Assessed-Lower Body Bathing: Shower Upper Body Dressing: Independent Where Assessed-Upper Body  Dressing: Standing at sink Lower Body Dressing: Independent Where Assessed-Lower Body Dressing: Edge of bed Toileting: Independent Where Assessed-Toileting: Glass blower/designer: Programmer, applications Method: Counselling psychologist: Grab bars, Raised toilet seat Tub/Shower Transfer: Theatre stage manager Method: Magazine features editor: IT consultant Method: Heritage manager: Radio broadcast assistant, Grab bars Vision Baseline Vision/History: 1 Wears glasses Patient Visual Report: No change from baseline Vision Assessment?: Yes Eye Alignment: Within Functional Limits Ocular Range of Motion: Within Functional Limits Alignment/Gaze Preference: Within Defined Limits Tracking/Visual Pursuits: Able to track stimulus in all quads without difficulty Saccades: Within functional limits Convergence: Within functional limits Visual Fields: No apparent deficits Perception  Perception: Within Functional Limits Praxis Praxis: Intact Cognition Overall Cognitive Status: No family/caregiver present to determine baseline cognitive functioning Arousal/Alertness: Awake/alert Orientation Level: Oriented X4 Year: 2022 Month: October Day of Week: Correct Attention: Sustained;Focused;Selective Focused Attention: Appears intact Sustained Attention: Appears intact Sustained Attention Impairment: Functional basic Selective Attention: Appears intact Memory: Appears intact Immediate Memory Recall: Sock;Blue;Bed Memory Recall Sock: With Cue Memory Recall Blue: Without Cue Memory Recall Bed: Without Cue Awareness: Impaired Awareness Impairment: Intellectual impairment;Emergent impairment Problem Solving: Appears intact Reasoning: Impaired Sequencing: Appears intact Self Monitoring: Impaired Safety/Judgment: Impaired Sensation Sensation Light Touch: Appears Intact Hot/Cold: Appears Intact Proprioception: Appears  Intact Stereognosis: Appears Intact Coordination Gross Motor Movements are Fluid and Coordinated: No Fine Motor Movements are Fluid and Coordinated: No Coordination and Movement Description: Slight dysmetria in the RUE compared to the left. Heel Shin Test: slightly ataxia R LE 9 Hole Peg Test: R=40 L=30 Motor  Motor Motor: Ataxia Motor - Skilled Clinical Observations: R hemi ataxia Motor - Discharge Observations: RUE ataxia noted Mobility  Bed Mobility Bed Mobility: Rolling Right;Rolling Left;Sit to Supine;Supine to Sit Rolling Right: Independent Rolling Left: Independent Supine to Sit: Independent Sit to Supine: Independent Transfers Sit to Stand: Independent Stand to Sit: Independent  Trunk/Postural Assessment  Cervical Assessment Cervical Assessment: Within Functional Limits Thoracic Assessment Thoracic Assessment: Exceptions to [facility] (thoracic rounding) Lumbar Assessment Lumbar Assessment: Exceptions to [facility] (slight posterior pelvic tilt) Postural Control Postural Control: Within Functional Limits  Balance Balance Balance Assessed: Yes Standardized Balance Assessment Standardized Balance Assessment: Berg Balance Test;Functional Gait Assessment Berg Balance Test Sit to Stand: Able to stand without using hands and stabilize independently Standing Unsupported: Able to stand safely 2 minutes Sitting with Back Unsupported but Feet Supported on Floor or Stool: Able to sit safely and securely 2 minutes Stand to Sit: Sits safely with minimal use of hands Transfers: Able to transfer safely, minor use of hands Standing Unsupported with Eyes Closed: Able to stand 10 seconds safely Standing Ubsupported with Feet Together: Able to place feet together independently and stand for 1 minute with supervision From Standing, Reach Forward with Outstretched Arm: Can reach forward >12 cm safely (5") From Standing Position, Pick up Object from Floor: Able to pick up shoe safely and  easily From Standing Position, Turn to Look Behind Over each Shoulder: Looks behind from both sides and weight shifts well Turn 360 Degrees: Able to turn 360 degrees safely in 4 seconds or less Standing Unsupported, Alternately Place Feet on Step/Stool: Able to stand independently and complete 8 steps >20 seconds Standing Unsupported, One Foot in Front: Able to plae foot ahead of the other  independently and hold 30 seconds Standing on One Leg: Able to lift leg independently and hold 5-10 seconds Total Score: 51 Static Sitting Balance Static Sitting - Balance Support: Feet supported Static Sitting - Level of Assistance: 7: Independent Dynamic Sitting Balance Dynamic Sitting - Balance Support: During functional activity Dynamic Sitting - Level of Assistance: 7: Independent Static Standing Balance Static Standing - Balance Support: During functional activity;No upper extremity supported Static Standing - Level of Assistance: 7: Independent Dynamic Standing Balance Dynamic Standing - Balance Support: During functional activity;No upper extremity supported Dynamic Standing - Level of Assistance: 7: Independent Functional Gait  Assessment Gait assessed : Yes Gait Level Surface: Walks 20 ft in less than 5.5 sec, no assistive devices, good speed, no evidence for imbalance, normal gait pattern, deviates no more than 6 in outside of the 12 in walkway width. Change in Gait Speed: Able to change speed, demonstrates mild gait deviations, deviates 6-10 in outside of the 12 in walkway width, or no gait deviations, unable to achieve a major change in velocity, or uses a change in velocity, or uses an assistive device. Gait with Horizontal Head Turns: Performs head turns smoothly with slight change in gait velocity (eg, minor disruption to smooth gait path), deviates 6-10 in outside 12 in walkway width, or uses an assistive device. Gait with Vertical Head Turns: Performs task with slight change in gait  velocity (eg, minor disruption to smooth gait path), deviates 6 - 10 in outside 12 in walkway width or uses assistive device Gait and Pivot Turn: Pivot turns safely in greater than 3 sec and stops with no loss of balance, or pivot turns safely within 3 sec and stops with mild imbalance, requires small steps to catch balance. Step Over Obstacle: Is able to step over one shoe box (4.5 in total height) without changing gait speed. No evidence of imbalance. Gait with Narrow Base of Support: Ambulates 7-9 steps. Gait with Eyes Closed: Walks 20 ft, uses assistive device, slower speed, mild gait deviations, deviates 6-10 in outside 12 in walkway width. Ambulates 20 ft in less than 9 sec but greater than 7 sec. Ambulating Backwards: Walks 20 ft, uses assistive device, slower speed, mild gait deviations, deviates 6-10 in outside 12 in walkway width. Steps: Alternating feet, must use rail. Total Score: 21 FGA comment:: score may be effected by patients need for explicit and consistent cuing throughout assessment Extremity/Trunk Assessment RUE Assessment RUE Assessment: Exceptions to [facility] General Strength Comments: 5/5 throughout with slight ataxia noted with functional reach and FM tasks, however uses functionally at a non-dominant level for selfcare tasks. LUE Assessment LUE Assessment: Within Functional Limits   Vu Liebman OTR/L 01/19/2021, 12:37 PM

## 2021-01-19 NOTE — Progress Notes (Signed)
Physical Therapy Discharge Summary  Patient Details  Name: Joshua [name] MRN: [id number] Date of Birth: 1961-01-19  Today's Date: 01/19/2021 PT Individual Time: 0902-0928 PT Individual Time Calculation (min): 26 min   Daily session: Patient received sitting up in recliner, agreeable to PT. He reports 5/10 pain in his low back, premedicated. PT providing rest breaks, distractions, and repositioning to assist with pain management. Patient grossly independent with gait and no overt LOB. Some difficulty dividing attention, but able to do so safely. Patient completing d/c assessment as noted below. He returned to room sitting up in recliner- patient with room independence, all needs within reach.   Patient has met 11 of 11 long term goals due to improved activity tolerance, improved balance, improved postural control, increased strength, ability to compensate for deficits, functional use of  right upper extremity and right lower extremity, improved attention, improved awareness, and improved coordination.  Patient to discharge at an ambulatory level Independent.   Patient's care partner is independent to provide the necessary cognitive assistance at discharge.    Recommendation:  Patient will benefit from ongoing skilled PT services in outpatient setting to continue to advance safe functional mobility, address ongoing impairments in awareness, dynamic balance, endurance, and minimize fall risk.  Equipment: No equipment provided  Reasons for discharge: treatment goals met and discharge from hospital  Patient/family agrees with progress made and goals achieved: Yes  PT Discharge Precautions/Restrictions Precautions Precautions: Fall Precaution Comments: Rt sided ataxia Restrictions Weight Bearing Restrictions: No Pain Pain Assessment Pain Scale: 0-10 Pain Score: 5  Pain Type: Acute pain Pain Location: Back Pain Descriptors / Indicators: Aching Pain Frequency: Intermittent Pain Onset:  With Activity Pain Intervention(s): Medication (See eMAR) Pain Interference Pain Interference Pain Effect on Sleep: 2. Occasionally Pain Interference with Therapy Activities: 1. Rarely or not at all Pain Interference with Day-to-Day Activities: 2. Occasionally Vision/Perception  Vision - History Ability to See in Adequate Light: 0 Adequate Perception Perception: Within Functional Limits Praxis Praxis: Intact  Cognition Overall Cognitive Status: No family/caregiver present to determine baseline cognitive functioning Arousal/Alertness: Awake/alert Orientation Level: Oriented X4 Focused Attention: Impaired Sustained Attention: Impaired Memory: Impaired Awareness: Impaired Problem Solving: Impaired Reasoning: Impaired Safety/Judgment: Impaired Sensation Sensation Light Touch: Appears Intact Hot/Cold: Appears Intact Proprioception: Appears Intact Stereognosis: Appears Intact Coordination Gross Motor Movements are Fluid and Coordinated: No Fine Motor Movements are Fluid and Coordinated: No Heel Shin Test: slightly ataxia R LE Motor  Motor Motor: Ataxia Motor - Skilled Clinical Observations: R hemi ataxia Motor - Discharge Observations: very slight R hemi ataxia  Mobility Bed Mobility Bed Mobility: Rolling Right;Rolling Left;Sit to Supine;Supine to Sit Rolling Right: Independent Rolling Left: Independent Supine to Sit: Independent Sit to Supine: Independent Transfers Transfers: Sit to Stand;Stand to [employer] Transfers Sit to Stand: Independent Stand to Sit: Independent Stand Pivot Transfers: Independent Transfer (Assistive device): None Locomotion  Gait Ambulation: Yes Gait Assistance: Independent Gait Distance (Feet): 300 Feet Assistive device: None Gait Gait: Yes Gait Pattern: Impaired Gait Pattern: Step-through pattern;Wide base of support Gait velocity: patient reports it's his typical gait speeed Stairs / Additional Locomotion Stairs: Yes Stairs  Assistance: Independent Stair Management Technique: One rail Right Number of Stairs: 12 Height of Stairs: 6 Ramp: Independent Curb: Independent Pick up small object from the floor assist level: Independent Wheelchair Mobility Wheelchair Mobility: No  Trunk/Postural Assessment  Cervical Assessment Cervical Assessment: Within Functional Limits Thoracic Assessment Thoracic Assessment: Exceptions to [facility] Lumbar Assessment Lumbar Assessment: Exceptions to [facility] Postural Control Postural Control: Within  Functional Limits  Balance Balance Balance Assessed: Yes Standardized Balance Assessment Standardized Balance Assessment: Berg Balance Test;Functional Gait Assessment Berg Balance Test Sit to Stand: Able to stand without using hands and stabilize independently Standing Unsupported: Able to stand safely 2 minutes Sitting with Back Unsupported but Feet Supported on Floor or Stool: Able to sit safely and securely 2 minutes Stand to Sit: Sits safely with minimal use of hands Transfers: Able to transfer safely, minor use of hands Standing Unsupported with Eyes Closed: Able to stand 10 seconds safely Standing Ubsupported with Feet Together: Able to place feet together independently and stand for 1 minute with supervision From Standing, Reach Forward with Outstretched Arm: Can reach forward >12 cm safely (5") From Standing Position, Pick up Object from Floor: Able to pick up shoe safely and easily From Standing Position, Turn to Look Behind Over each Shoulder: Looks behind from both sides and weight shifts well Turn 360 Degrees: Able to turn 360 degrees safely in 4 seconds or less Standing Unsupported, Alternately Place Feet on Step/Stool: Able to stand independently and complete 8 steps >20 seconds Standing Unsupported, One Foot in Front: Able to plae foot ahead of the other independently and hold 30 seconds Standing on One Leg: Able to lift leg independently and hold 5-10 seconds Total Score:  51 Static Sitting Balance Static Sitting - Balance Support: Feet supported Static Sitting - Level of Assistance: 7: Independent Dynamic Sitting Balance Dynamic Sitting - Balance Support: During functional activity Dynamic Sitting - Level of Assistance: 7: Independent Static Standing Balance Static Standing - Balance Support: During functional activity;No upper extremity supported Static Standing - Level of Assistance: 7: Independent Dynamic Standing Balance Dynamic Standing - Balance Support: During functional activity;No upper extremity supported Dynamic Standing - Level of Assistance: 7: Independent Functional Gait  Assessment Gait assessed : Yes Gait Level Surface: Walks 20 ft in less than 5.5 sec, no assistive devices, good speed, no evidence for imbalance, normal gait pattern, deviates no more than 6 in outside of the 12 in walkway width. Change in Gait Speed: Able to change speed, demonstrates mild gait deviations, deviates 6-10 in outside of the 12 in walkway width, or no gait deviations, unable to achieve a major change in velocity, or uses a change in velocity, or uses an assistive device. Gait with Horizontal Head Turns: Performs head turns smoothly with slight change in gait velocity (eg, minor disruption to smooth gait path), deviates 6-10 in outside 12 in walkway width, or uses an assistive device. Gait with Vertical Head Turns: Performs task with slight change in gait velocity (eg, minor disruption to smooth gait path), deviates 6 - 10 in outside 12 in walkway width or uses assistive device Gait and Pivot Turn: Pivot turns safely in greater than 3 sec and stops with no loss of balance, or pivot turns safely within 3 sec and stops with mild imbalance, requires small steps to catch balance. Step Over Obstacle: Is able to step over one shoe box (4.5 in total height) without changing gait speed. No evidence of imbalance. Gait with Narrow Base of Support: Ambulates 7-9 steps. Gait with  Eyes Closed: Walks 20 ft, uses assistive device, slower speed, mild gait deviations, deviates 6-10 in outside 12 in walkway width. Ambulates 20 ft in less than 9 sec but greater than 7 sec. Ambulating Backwards: Walks 20 ft, uses assistive device, slower speed, mild gait deviations, deviates 6-10 in outside 12 in walkway width. Steps: Alternating feet, must use rail. Total Score: 21  FGA comment:: score may be effected by patients need for explicit and consistent cuing throughout assessment Extremity Assessment      RLE Assessment General Strength Comments: grossly 4/5 LLE Assessment LLE Assessment: Within Functional Limits General Strength Comments: grossly 5/5    Debbora Dus 01/19/2021, 9:28 AM

## 2021-01-19 NOTE — Plan of Care (Signed)
  Problem: RH Expression Communication Goal: LTG Patient will increase speech intelligibility (SLP) Description: LTG: Patient will increase speech intelligibility at word/phrase/conversation level with cues, % of the time (SLP) Outcome: Completed/Met   Problem: RH Problem Solving Goal: LTG Patient will demonstrate problem solving for (SLP) Description: LTG:  Patient will demonstrate problem solving for basic/complex daily situations with cues  (SLP) Outcome: Completed/Met   Problem: RH Memory Goal: LTG Patient will use memory compensatory aids to (SLP) Description: LTG:  Patient will use memory compensatory aids to recall biographical/new, daily complex information with cues (SLP) Outcome: Completed/Met   Problem: RH Awareness Goal: LTG: Patient will demonstrate awareness during functional activites type of (SLP) Description: LTG: Patient will demonstrate awareness during functional activites type of (SLP) Outcome: Completed/Met

## 2021-01-19 NOTE — Progress Notes (Signed)
+/-   sleep per patient. "I was dreaming a lot." PRN tylenol given at 2006, Continent of bladder, ambulated to BR. Excited about going home on Saturday. Joshua [name] A

## 2021-01-19 NOTE — Plan of Care (Signed)
  Problem: RH Balance Goal: LTG Patient will maintain dynamic sitting balance (PT) Description: LTG:  Patient will maintain dynamic sitting balance with assistance during mobility activities (PT) Outcome: Completed/Met Goal: LTG Patient will maintain dynamic standing balance (PT) Description: LTG:  Patient will maintain dynamic standing balance with assistance during mobility activities (PT) Outcome: Completed/Met   Problem: Sit to Stand Goal: LTG:  Patient will perform sit to stand with assistance level (PT) Description: LTG:  Patient will perform sit to stand with assistance level (PT) Outcome: Completed/Met   Problem: RH Bed Mobility Goal: LTG Patient will perform bed mobility with assist (PT) Description: LTG: Patient will perform bed mobility with assistance, with/without cues (PT). Outcome: Completed/Met   Problem: RH Bed to Chair Transfers Goal: LTG Patient will perform bed/chair transfers w/assist (PT) Description: LTG: Patient will perform bed to chair transfers with assistance (PT). Outcome: Completed/Met   Problem: RH Car Transfers Goal: LTG Patient will perform car transfers with assist (PT) Description: LTG: Patient will perform car transfers with assistance (PT). Outcome: Completed/Met   Problem: RH Floor Transfers Goal: LTG Patient will perform floor transfers w/assist (PT) Description: LTG: Patient will perform floor transfers with assistance (PT). Outcome: Completed/Met   Problem: RH Ambulation Goal: LTG Patient will ambulate in controlled environment (PT) Description: LTG: Patient will ambulate in a controlled environment, # of feet with assistance (PT). Outcome: Completed/Met Goal: LTG Patient will ambulate in home environment (PT) Description: LTG: Patient will ambulate in home environment, # of feet with assistance (PT). Outcome: Completed/Met Goal: LTG Patient will ambulate in community environment (PT) Description: LTG: Patient will ambulate in  community environment, # of feet with assistance (PT). Outcome: Completed/Met   Problem: RH Stairs Goal: LTG Patient will ambulate up and down stairs w/assist (PT) Description: LTG: Patient will ambulate up and down # of stairs with assistance (PT) Outcome: Completed/Met

## 2021-01-19 NOTE — Progress Notes (Signed)
PROGRESS NOTE   Subjective/Complaints:  Mod I in room without issues , excited about d/c in am   ROS- neg CP, SOB, N/V/D  Objective:   No results found. No results for input(s): WBC, HGB, HCT, PLT in the last 72 hours.  Recent Labs    01/17/21 0503  NA 135  K 4.3  CL 102  CO2 20*  GLUCOSE 100*  BUN 27*  CREATININE 1.11  CALCIUM 9.1     Intake/Output Summary (Last 24 hours) at 01/19/2021 0912 Last data filed at 01/19/2021 7673 Gross per 24 hour  Intake 1080 ml  Output --  Net 1080 ml         Physical Exam: Vital Signs Blood pressure 113/76, pulse 69, temperature 97.7 F (36.5 C), temperature source Oral, resp. rate 16, height 6' (1.829 m), weight 112.1 kg, SpO2 93 %.   General: No acute distress Mood and affect are appropriate Heart: Regular rate and rhythm no rubs murmurs or extra sounds Lungs: Clear to auscultation, breathing unlabored, no rales or wheezes Abdomen: Positive bowel sounds, soft nontender to palpation, nondistended Extremities: No clubbing, cyanosis, or edema Skin: No evidence of breakdown, no evidence of rash  Neurologic: Speech dysarthric no aphasia Cranial nerves II through XII intact, motor strength is 5/5 in bilateral deltoid, bicep, tricep, grip, hip flexor, knee extensors, ankle dorsiflexor and plantar flexor Sensory exam normal sensation to light touch  in bilateral upper and lower extremities Cerebellar exam normal finger to nose to finger as well as heel to shin in Left upper and lower extremities, mod dysmetria RUE , mild dysmetria RLE Positive dysdiadochokinesis with rapid alternating supination pronation of the right upper extremity. Musculoskeletal: Full range of motion in all 4 extremities. No joint swelling    Assessment/Plan: 1. Functional deficits which require 3+ hours per day of interdisciplinary therapy in a comprehensive inpatient rehab setting. Physiatrist is  providing close team supervision and 24 hour management of active medical problems listed below. Physiatrist and rehab team continue to assess barriers to discharge/monitor patient progress toward functional and medical goals  Care Tool:  Bathing    Body parts bathed by patient: Right arm, Left arm, Chest, Abdomen, Front perineal area, Buttocks, Right upper leg, Left upper leg, Right lower leg, Left lower leg, Face         Bathing assist Assist Level: Supervision/Verbal cueing     Upper Body Dressing/Undressing Upper body dressing   What is the patient wearing?: Pull over shirt    Upper body assist Assist Level: Supervision/Verbal cueing    Lower Body Dressing/Undressing Lower body dressing      What is the patient wearing?: Underwear/pull up, Pants     Lower body assist Assist for lower body dressing: Contact Guard/Touching assist     Toileting Toileting    Toileting assist Assist for toileting: Supervision/Verbal cueing Assistive Device Comment: urinal   Transfers Chair/bed transfer  Transfers assist     Chair/bed transfer assist level: Minimal Assistance - Patient > 75%     Locomotion Ambulation   Ambulation assist      Assist level: Minimal Assistance - Patient > 75% Assistive device: No Device Max distance: 239ft  Walk 10 feet activity   Assist     Assist level: Contact Guard/Touching assist Assistive device: Hand held assist   Walk 50 feet activity   Assist    Assist level: Minimal Assistance - Patient > 75% Assistive device: Hand held assist    Walk 150 feet activity   Assist    Assist level: Minimal Assistance - Patient > 75% Assistive device: Hand held assist    Walk 10 feet on uneven surface  activity   Assist     Assist level: Minimal Assistance - Patient > 75% Assistive device: Hand held assist   Wheelchair     Assist Is the patient using a wheelchair?: No             Wheelchair 50 feet with 2  turns activity    Assist            Wheelchair 150 feet activity     Assist          Blood pressure 113/76, pulse 69, temperature 97.7 F (36.5 C), temperature source Oral, resp. rate 16, height 6' (1.829 m), weight 112.1 kg, SpO2 93 %.  Medical Problem List and Plan: 1.  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  Antithrombotics: -DVT/anticoagulation:  Pharmaceutical: Lovenox             -antiplatelet therapy: Aspirin and Brilinta for BA stent/stroke X 1 year (was given 9 sample bottles as well as paperwork to apply for assistance by radiology) 3. Pain Management: N/A 4. Mood: LCSW to follow for evaluation and support             -antipsychotic agents: N/A 5. Neuropsych: This patient is capable of making decisions on his own behalf. 6. Skin/Wound Care: Routine pressure-relief measures. 7. Fluids/Electrolytes/Nutrition: Monitor intake/output.  8.  HTN: Monitor blood pressures TID--continue Coreg 3.125mg  BID , Lasix 40mg  qd and spironolactone 25mg  qd .  hydralazine 10mg  TID started 10/23,  Vitals:   01/18/21 1931 01/19/21 0439  BP: 121/87 113/76  Pulse: 77 69  Resp: 16 16  Temp: 98.6 F (37 C) 97.7 F (36.5 C)  SpO2: 96% 93%  Blood pressure well controlled 10/28  9.  CAD/systolic CHF w/ EF 35-40%: Check weights daily and monitor for signs of overload. --Continue Coreg, Entresto, Lasix, spironolactone, Lipitor and ASA. Low fluid intake 01/20/21 recorded with 900out , creat stable but will monitor  BMET normal with the exception of borderline high BUN 10.  Dyslipidemia: On high-dose Lipitor 80 mg daily 11.  Polycythemia: Will monitor with serial CBC. 12.  Obesity: Encourage weight loss as well as appropriate diet to help promote overall health  and mobility. 13. Bradycardic: decrease Coreg to 3.125mg  BID running in 60s and 70s    LOS: 5 days A FACE TO FACE EVALUATION WAS PERFORMED  01/21/21 01/19/2021, 9:12 AM

## 2021-01-20 NOTE — Progress Notes (Signed)
Pt discharged to home accompanied by pt's friend. Discharge instructions, belongings, and home meds sent with pt. No further questions from pt or friend.   Marylu Lund, RN

## 2021-01-20 NOTE — Progress Notes (Signed)
Slept good, excited about going home today. Talked with patient about getting a BP machine to monitor BP's. Educated on checking weight and writing weight & BP down. "Ok". Joshua [name] A

## 2021-01-22 DIAGNOSIS — R7989 Other specified abnormal findings of blood chemistry: Secondary | ICD-10-CM

## 2021-01-22 NOTE — Discharge Summary (Signed)
Physician Discharge Summary  Patient ID: [name] MRN: [id number] DOB/AGE: Oct 29, 1960 60 y.o.  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)   Prerenal azotemia   Discharged Condition: stable  Significant Diagnostic Studies: N/A   Labs:  Basic Metabolic Panel: BMP Latest Ref Rng & Units 01/17/2021 01/15/2021 01/14/2021  Glucose 70 - 99 mg/dL 086(P) 619(J) 093(O)  BUN 6 - 20 mg/dL 67(T) 24(P) 80(D)  Creatinine 0.61 - 1.24 mg/dL 9.83 3.82 5.05  BUN/Creat Ratio 9 - 20 - - -  Sodium 135 - 145 mmol/L 135 135 134(L)  Potassium 3.5 - 5.1 mmol/L 4.3 3.8 3.8  Chloride 98 - 111 mmol/L 102 101 101  CO2 22 - 32 mmol/L 20(L) 25 24  Calcium 8.9 - 10.3 mg/dL 9.1 9.1 9.0     CBC: CBC Latest Ref Rng & Units 01/15/2021 01/14/2021 01/13/2021  WBC 4.0 - 10.5 K/uL 8.1 9.2 9.7  Hemoglobin 13.0 - 17.0 g/dL 17.1(H) 17.2(H) 16.6  Hematocrit 39.0 - 52.0 % 49.9 49.6 48.2  Platelets 150 - 400 K/uL 211 188 198     CBG: No results for input(s): GLUCAP in the last 168 hours.  Brief HPI:   Joshua [name] is a 60 y.o. 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  He was taken to IR suite immediately and underwent cerebral angio with revascularization of occluded basilar artery and stent assisted angioplasty of R-VBJ.  He was started on Cleviprex for BP control and home meds were slowly resumed.  MRI brain showed small infarcts scattered throughout bilateral PCA as well as mild mass-effect on fourth ventricle and occasional punctate anterior circulation infarcts.  MRA brain showed patent basilar artery without stenosis and patent bilateral AICA/SCA  and PCA origin.  Patient on aspirin/Brilinta for stroke embolic, large vessel disease.  He was noted to have limitations due to RLE ataxia with balance deficits, anxiety/fear, RUE weakness as well as bouts of lability with tangential speech.  CIR was recommended due to functional decline.   Hospital Course: Joshua [name] was admitted to rehab 01/14/2021 for inpatient therapies to consist of PT, ST and OT at least three hours five days a week. Past admission physiatrist, therapy team and rehab RN have worked together to provide customized collaborative inpatient rehab.  His blood pressures were monitored on TID basis and hydralazine was added to help with better BP control.  Serial check of electrolytes showed prerenal azotemia and he was encouraged to increase fluid intake.  He continues on high-dose Lipitor as well as ASA/Brilinta for BA stent.  Radiology has provided patient with 9 sample bottles as well as paperwork to apply for assistance.  Heart rate was noted to be running low therefore Coreg was decreased to 3.125 mg twice daily.  Daily check of weights have been stable and no signs of overload noted.  He has made steady gains during his rehab stay and is currently modified independent.  He will continue to receive follow-up outpatient PT. OT and ST at [facility] neuro rehab after discharge.     Rehab course: During patient's stay in rehab team conference was held to monitor patient's progress, set goals and discuss barriers to discharge. At admission, patient required  min assist with mobility and with basic ADL tasks.  He exhibited mild cognitive impairments and mild dysarthria with verbosity, lability with perseverative and tangential speech.  He has had improvement in activity tolerance, balance, postural control as well as ability to compensate for deficits. He has had improvement in functional use RUE  and RLE as well as improvement in awareness.  He is able to complete ADL tasks at modified independent  level.  He continues to have mild right hemiataxia but is independent for transfers and is able to ambulate 300 feet without assistive device. He is showing improvement in intellectual emergent awareness and is able to use compensatory strategies with supervision.  His friends will provide assistance as needed after discharge.  Discharge disposition: 01-Home or Self Care  Diet: Heart healthy  Special Instructions: 1.  No driving or strenuous activity till cleared by MD.   Discharge Instructions     Ambulatory referral to Neurology   Complete by: As directed    An appointment is requested in approximately: 4 weeks posterior circulation infarct   Ambulatory referral to Physical Medicine Rehab   Complete by: As directed    Moderate complexity follow up posterior circulation infarction      Allergies as of 01/20/2021   No Known Allergies      Medication List     TAKE these medications    acetaminophen 325 MG tablet Commonly known as: TYLENOL Take 2 tablets (650 mg total) by mouth every 4 (four) hours as needed for mild pain (or temp > 37.5 C (99.5 F)).   aspirin 81 MG chewable tablet Chew 1 tablet (81 mg total) by mouth daily.   atorvastatin 80 MG tablet Commonly known as: LIPITOR Take 1 tablet (80 mg total) by mouth daily.   carvedilol 3.125 MG tablet Commonly known as: COREG Take 1 tablet (3.125 mg total) by mouth 2 (two) times daily with a meal. What changed:  medication strength how much to take   docusate sodium 100 MG capsule Commonly known as: COLACE Take 1 capsule (100 mg total) by mouth daily.   furosemide 40 MG tablet Commonly known as: LASIX Take 1 tablet (40 mg total) by mouth daily.   hydrALAZINE 10 MG tablet Commonly known as: APRESOLINE Take 1 tablet (10 mg total) by mouth every 8 (eight) hours.   spironolactone 25 MG tablet Commonly known as: ALDACTONE Take 1 tablet (25 mg total) by mouth daily.   ticagrelor 90 MG Tabs tablet Commonly  known as: BRILINTA Take 1 tablet (90 mg total) by mouth 2 (two) times daily.        Follow-up Information     Coral Spikes, DO .   Specialty: Family Medicine Contact information: [place] [address] [place] [address] [phone]         Letta Pate Luanna Salk, MD Follow up.   Specialty: Physical Medicine and Rehabilitation Why: Office to call for appointment Contact information: [place] [address] [phone]         Luanne Bras, MD Follow up.   Specialties: Interventional Radiology, Radiology Why: call for appointment Contact information: [place] [address] [phone]                 Signed: Bary Leriche 01/22/2021, 4:39 PM

## 2021-01-23 ENCOUNTER — Telehealth: Payer: Self-pay | Admitting: Physical Medicine & Rehabilitation

## 2021-01-23 ENCOUNTER — Telehealth: Payer: Self-pay

## 2021-01-23 NOTE — Telephone Encounter (Signed)
I called patient to schedule Transitional Care appt. Patient stated he did not want to come in for an appointment. I explained we wanted to make sure he was receiving all proper care and he stated he understood and would do his walking at home that he did not need to come in for an appointment with the doctor.

## 2021-01-23 NOTE — Telephone Encounter (Signed)
Transition Care Management Follow-up Telephone Call Date of discharge and from where: 01/20/21. [facility] Diagnosis: CVA, Brain Infarct How have you been since you were released from the hospital? Pt states he is doing well. Staying with at a friend's house. Any questions or concerns? No  Items Reviewed: Did the pt receive and understand the discharge instructions provided? Yes  Medications obtained and verified? Yes  Other? No  Any new allergies since your discharge? No  Dietary orders reviewed? Yes Do you have support at home? Yes   Home Care and Equipment/Supplies: Were home health services ordered? no If so, what is the name of the agency?   Has the agency set up a time to come to the patient's home? not applicable Were any new equipment or medical supplies ordered?  No What is the name of the medical supply agency?  Were you able to get the supplies/equipment? not applicable Do you have any questions related to the use of the equipment or supplies? No  Functional Questionnaire: (I = Independent and D = Dependent) ADLs: I  Bathing/Dressing- I  Meal Prep- I  Eating- I  Maintaining continence- I  Transferring/Ambulation- I  Managing Meds- I  Follow up appointments reviewed:  PCP Hospital f/u appt confirmed? Yes  Scheduled to see Dr Adriana Simas on 01/29/21 @ 11:20. Specialist Hospital f/u appt confirmed? No   Are transportation arrangements needed? No  If their condition worsens, is the pt aware to call PCP or go to the Emergency Dept.? Yes Was the patient provided with contact information for the PCP's office or ED? Yes Was to pt encouraged to call back with questions or concerns? Yes

## 2021-01-29 ENCOUNTER — Telehealth (HOSPITAL_COMMUNITY): Payer: Self-pay

## 2021-01-29 ENCOUNTER — Inpatient Hospital Stay: Payer: BC Managed Care – PPO | Admitting: Family Medicine

## 2021-01-29 ENCOUNTER — Other Ambulatory Visit (HOSPITAL_COMMUNITY): Payer: Self-pay | Admitting: Interventional Radiology

## 2021-01-29 DIAGNOSIS — I771 Stricture of artery: Secondary | ICD-10-CM

## 2021-01-29 NOTE — Telephone Encounter (Signed)
Called to schedule 2 week f/u. Pt does not have a ride at this time. He will call me back to schedule. AW

## 2021-01-30 ENCOUNTER — Encounter: Payer: Self-pay | Admitting: Family Medicine

## 2021-02-07 ENCOUNTER — Ambulatory Visit: Payer: BC Managed Care – PPO

## 2021-02-07 ENCOUNTER — Ambulatory Visit: Payer: BC Managed Care – PPO | Admitting: Occupational Therapy

## 2021-04-20 ENCOUNTER — Other Ambulatory Visit: Payer: Self-pay

## 2021-04-20 NOTE — Patient Outreach (Signed)
[facility]) Care Management  04/20/2021  Joshua [name] Aug 27, 1960 [id number]   mRs could not be obtained because patient was busy. When asked if better time to call pt informed is very busy and is doing fine as someone has already spoke with him. mRs=7    Vanice Sarah Care Management Assistant [phone]

## 2021-06-25 ENCOUNTER — Telehealth: Payer: Self-pay | Admitting: *Deleted

## 2021-06-25 DIAGNOSIS — Z006 Encounter for examination for normal comparison and control in clinical research program: Secondary | ICD-10-CM

## 2021-06-25 NOTE — Telephone Encounter (Signed)
I called patient to let him know about the Prevail study.Patient did not have voicemail or email to leave message. ?

## 2023-09-12 ENCOUNTER — Observation Stay (HOSPITAL_COMMUNITY)
Admit: 2023-09-12 | Discharge: 2023-09-12 | Disposition: A | Discharge status: Home / Self Care | Payer: Self-pay | Attending: Internal Medicine | Admitting: Internal Medicine

## 2023-09-12 ENCOUNTER — Encounter: Payer: Self-pay | Admitting: Intensive Care

## 2023-09-12 ENCOUNTER — Emergency Department: Payer: Self-pay

## 2023-09-12 ENCOUNTER — Other Ambulatory Visit: Payer: Self-pay

## 2023-09-12 ENCOUNTER — Inpatient Hospital Stay
Admission: EM | Admit: 2023-09-12 | Discharge: 2023-09-15 | DRG: 291 | Disposition: A | Discharge status: Home / Self Care | Payer: MEDICAID | Attending: Student in an Organized Health Care Education/Training Program | Admitting: Student in an Organized Health Care Education/Training Program

## 2023-09-12 DIAGNOSIS — Z91148 Patient's other noncompliance with medication regimen for other reason: Secondary | ICD-10-CM

## 2023-09-12 DIAGNOSIS — I110 Hypertensive heart disease with heart failure: Principal | ICD-10-CM | POA: Diagnosis present

## 2023-09-12 DIAGNOSIS — I10 Essential (primary) hypertension: Secondary | ICD-10-CM | POA: Diagnosis present

## 2023-09-12 DIAGNOSIS — E6609 Other obesity due to excess calories: Secondary | ICD-10-CM | POA: Diagnosis present

## 2023-09-12 DIAGNOSIS — I5043 Acute on chronic combined systolic (congestive) and diastolic (congestive) heart failure: Principal | ICD-10-CM | POA: Diagnosis present

## 2023-09-12 DIAGNOSIS — E785 Hyperlipidemia, unspecified: Secondary | ICD-10-CM | POA: Diagnosis present

## 2023-09-12 DIAGNOSIS — E876 Hypokalemia: Secondary | ICD-10-CM | POA: Diagnosis not present

## 2023-09-12 DIAGNOSIS — Z7982 Long term (current) use of aspirin: Secondary | ICD-10-CM

## 2023-09-12 DIAGNOSIS — I5021 Acute systolic (congestive) heart failure: Secondary | ICD-10-CM

## 2023-09-12 DIAGNOSIS — Z951 Presence of aortocoronary bypass graft: Secondary | ICD-10-CM

## 2023-09-12 DIAGNOSIS — E66812 Obesity, class 2: Secondary | ICD-10-CM | POA: Diagnosis present

## 2023-09-12 DIAGNOSIS — Z5971 Insufficient health insurance coverage: Secondary | ICD-10-CM

## 2023-09-12 DIAGNOSIS — I2489 Other forms of acute ischemic heart disease: Secondary | ICD-10-CM | POA: Diagnosis present

## 2023-09-12 DIAGNOSIS — Z6839 Body mass index (BMI) 39.0-39.9, adult: Secondary | ICD-10-CM

## 2023-09-12 DIAGNOSIS — R0902 Hypoxemia: Secondary | ICD-10-CM | POA: Diagnosis present

## 2023-09-12 DIAGNOSIS — Z1152 Encounter for screening for COVID-19: Secondary | ICD-10-CM

## 2023-09-12 DIAGNOSIS — Z8249 Family history of ischemic heart disease and other diseases of the circulatory system: Secondary | ICD-10-CM

## 2023-09-12 DIAGNOSIS — K219 Gastro-esophageal reflux disease without esophagitis: Secondary | ICD-10-CM | POA: Diagnosis present

## 2023-09-12 DIAGNOSIS — Z79899 Other long term (current) drug therapy: Secondary | ICD-10-CM

## 2023-09-12 DIAGNOSIS — I509 Heart failure, unspecified: Secondary | ICD-10-CM

## 2023-09-12 DIAGNOSIS — Z8673 Personal history of transient ischemic attack (TIA), and cerebral infarction without residual deficits: Secondary | ICD-10-CM

## 2023-09-12 DIAGNOSIS — Z87442 Personal history of urinary calculi: Secondary | ICD-10-CM

## 2023-09-12 DIAGNOSIS — I2510 Atherosclerotic heart disease of native coronary artery without angina pectoris: Secondary | ICD-10-CM | POA: Diagnosis present

## 2023-09-12 DIAGNOSIS — Z66 Do not resuscitate: Secondary | ICD-10-CM | POA: Diagnosis present

## 2023-09-12 DIAGNOSIS — Z7902 Long term (current) use of antithrombotics/antiplatelets: Secondary | ICD-10-CM

## 2023-09-12 LAB — CBC
HCT: 43.9 % (ref 39.0–52.0)
Hemoglobin: 12.7 g/dL — ABNORMAL LOW (ref 13.0–17.0)
MCH: 24.4 pg — ABNORMAL LOW (ref 26.0–34.0)
MCHC: 28.9 g/dL — ABNORMAL LOW (ref 30.0–36.0)
MCV: 84.4 fL (ref 80.0–100.0)
Platelets: 228 10*3/uL (ref 150–400)
RBC: 5.2 MIL/uL (ref 4.22–5.81)
RDW: 20.9 % — ABNORMAL HIGH (ref 11.5–15.5)
WBC: 7.9 10*3/uL (ref 4.0–10.5)
nRBC: 0 % (ref 0.0–0.2)

## 2023-09-12 LAB — ECHOCARDIOGRAM COMPLETE
Area-P 1/2: 5.02 cm2
Height: 72 in
S' Lateral: 5 cm
Single Plane A2C EF: 36.4 %
Weight: 4640 [oz_av]

## 2023-09-12 LAB — BRAIN NATRIURETIC PEPTIDE: B Natriuretic Peptide: 638.6 pg/mL — ABNORMAL HIGH (ref 0.0–100.0)

## 2023-09-12 LAB — BASIC METABOLIC PANEL WITH GFR
Anion gap: 6 (ref 5–15)
BUN: 20 mg/dL (ref 8–23)
CO2: 26 mmol/L (ref 22–32)
Calcium: 8.6 mg/dL — ABNORMAL LOW (ref 8.9–10.3)
Chloride: 108 mmol/L (ref 98–111)
Creatinine, Ser: 1.25 mg/dL — ABNORMAL HIGH (ref 0.61–1.24)
GFR, Estimated: >60 mL/min (ref >60)
Glucose, Bld: 108 mg/dL — ABNORMAL HIGH (ref 70–99)
Potassium: 4.1 mmol/L (ref 3.5–5.1)
Sodium: 140 mmol/L (ref 135–145)

## 2023-09-12 LAB — HIV ANTIBODY (ROUTINE TESTING W REFLEX): HIV Screen 4th Generation wRfx: NONREACTIVE

## 2023-09-12 LAB — TROPONIN I (HIGH SENSITIVITY): Troponin I (High Sensitivity): 29 ng/L — ABNORMAL HIGH (ref <18)

## 2023-09-12 MED ORDER — FUROSEMIDE 10 MG/ML IJ SOLN
80.0000 mg | Freq: Once | INTRAMUSCULAR | Status: AC
  Administered 2023-09-12: 80 mg via INTRAVENOUS
  Filled 2023-09-12: qty 8

## 2023-09-12 MED ORDER — HYDRALAZINE HCL 20 MG/ML IJ SOLN
5.0000 mg | INTRAMUSCULAR | Status: DC | PRN
Start: 2023-09-12 — End: 2023-09-13

## 2023-09-12 MED ORDER — ONDANSETRON HCL 4 MG PO TABS: 4.0000 mg | ORAL_TABLET | Freq: Four times a day (QID) | ORAL | Status: DC | PRN

## 2023-09-12 MED ORDER — POLYETHYLENE GLYCOL 3350 17 G PO PACK: 17.0000 g | PACK | Freq: Every day | ORAL | Status: DC | PRN

## 2023-09-12 MED ORDER — CARVEDILOL 3.125 MG PO TABS
3.1250 mg | ORAL_TABLET | Freq: Two times a day (BID) | ORAL | Status: DC
  Administered 2023-09-12 – 2023-09-15 (×6): 3.125 mg via ORAL
  Filled 2023-09-12 (×7): qty 1

## 2023-09-12 MED ORDER — ASPIRIN 81 MG PO CHEW
81.0000 mg | CHEWABLE_TABLET | Freq: Every day | ORAL | 0 refills | Status: DC
  Filled 2023-09-12: qty 30, 30d supply, fill #0

## 2023-09-12 MED ORDER — FUROSEMIDE 40 MG PO TABS
40.0000 mg | ORAL_TABLET | Freq: Every day | ORAL | 0 refills | Status: DC
  Filled 2023-09-12: qty 30, 30d supply, fill #0

## 2023-09-12 MED ORDER — BISACODYL 5 MG PO TBEC: 5.0000 mg | DELAYED_RELEASE_TABLET | Freq: Every day | ORAL | Status: DC | PRN

## 2023-09-12 MED ORDER — ATORVASTATIN CALCIUM 80 MG PO TABS
80.0000 mg | ORAL_TABLET | Freq: Every day | ORAL | Status: DC
  Administered 2023-09-12 – 2023-09-15 (×4): 80 mg via ORAL
  Filled 2023-09-12 (×3): qty 1
  Filled 2023-09-12: qty 4

## 2023-09-12 MED ORDER — ACETAMINOPHEN 325 MG PO TABS
650.0000 mg | ORAL_TABLET | Freq: Four times a day (QID) | ORAL | Status: DC | PRN
  Administered 2023-09-13 – 2023-09-15 (×2): 650 mg via ORAL
  Filled 2023-09-12 (×2): qty 2

## 2023-09-12 MED ORDER — ACETAMINOPHEN 650 MG RE SUPP: 650.0000 mg | Freq: Four times a day (QID) | RECTAL | Status: DC | PRN

## 2023-09-12 MED ORDER — FUROSEMIDE 10 MG/ML IJ SOLN
40.0000 mg | Freq: Two times a day (BID) | INTRAMUSCULAR | Status: DC
  Administered 2023-09-12: 40 mg via INTRAVENOUS
  Filled 2023-09-12: qty 4

## 2023-09-12 MED ORDER — DOCUSATE SODIUM 100 MG PO CAPS
100.0000 mg | ORAL_CAPSULE | Freq: Two times a day (BID) | ORAL | Status: DC
  Administered 2023-09-12 (×2): 100 mg via ORAL
  Filled 2023-09-12 (×2): qty 1

## 2023-09-12 MED ORDER — SPIRONOLACTONE 25 MG PO TABS
25.0000 mg | ORAL_TABLET | Freq: Every day | ORAL | 0 refills | Status: DC
  Filled 2023-09-12: qty 30, 30d supply, fill #0

## 2023-09-12 MED ORDER — ENOXAPARIN SODIUM 80 MG/0.8ML IJ SOSY
65.0000 mg | PREFILLED_SYRINGE | INTRAMUSCULAR | Status: DC
  Administered 2023-09-12 – 2023-09-14 (×3): 65 mg via SUBCUTANEOUS
  Filled 2023-09-12: qty 0.8
  Filled 2023-09-12: qty 0.65
  Filled 2023-09-12 (×2): qty 0.8

## 2023-09-12 MED ORDER — ASPIRIN 81 MG PO CHEW
81.0000 mg | CHEWABLE_TABLET | Freq: Every day | ORAL | Status: DC
  Administered 2023-09-12: 81 mg via ORAL
  Filled 2023-09-12: qty 1

## 2023-09-12 MED ORDER — SPIRONOLACTONE 25 MG PO TABS
25.0000 mg | ORAL_TABLET | Freq: Every day | ORAL | Status: DC
  Administered 2023-09-12 – 2023-09-15 (×4): 25 mg via ORAL
  Filled 2023-09-12 (×5): qty 1

## 2023-09-12 MED ORDER — SODIUM CHLORIDE 0.9% FLUSH
3.0000 mL | Freq: Two times a day (BID) | INTRAVENOUS | Status: DC
  Administered 2023-09-12 – 2023-09-15 (×7): 3 mL via INTRAVENOUS

## 2023-09-12 MED ORDER — CARVEDILOL 3.125 MG PO TABS
3.1250 mg | ORAL_TABLET | Freq: Two times a day (BID) | ORAL | 0 refills | Status: DC
  Filled 2023-09-12: qty 60, 30d supply, fill #0

## 2023-09-12 MED ORDER — ATORVASTATIN CALCIUM 80 MG PO TABS
80.0000 mg | ORAL_TABLET | Freq: Every day | ORAL | 0 refills | Status: DC
  Filled 2023-09-12: qty 30, 30d supply, fill #0

## 2023-09-12 MED ORDER — ONDANSETRON HCL 4 MG/2ML IJ SOLN: 4.0000 mg | Freq: Four times a day (QID) | INTRAMUSCULAR | Status: DC | PRN

## 2023-09-12 NOTE — TOC Initial Note (Signed)
 Transition of Care [facility]) - Initial/Assessment Note    Patient Details  Name: Joshua [name] MRN: [id number] Date of Birth: August 31, 1960  Transition of Care [facility]) CM/SW Contact:    [place] L Shyann Hefner, LCSW Phone Number: 09/12/2023, 3:26 PM  Clinical Narrative:                  [facility] consult received for medication assistance. Pt is advising that he can not afford his medication. Message sent to pt pharmacist.        Patient Goals and CMS Choice            Expected Discharge Plan and Services                                              Prior Living Arrangements/Services                       Activities of Daily Living      Permission Sought/Granted                  Emotional Assessment              Admission diagnosis:  Acute on chronic combined systolic (congestive) and diastolic (congestive) heart failure (HCC) [I50.43] Patient Active Problem List   Diagnosis Date Noted   Acute on chronic combined systolic (congestive) and diastolic (congestive) heart failure (HCC) 09/12/2023   DNR (do not resuscitate) 09/12/2023   Prerenal azotemia 01/22/2021   Brainstem infarct, acute (HCC) 01/17/2021   Dyslipidemia 01/14/2021   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) 01/14/2021   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   Basilar artery occlusion 01/09/2021   Chronic combined systolic and diastolic heart failure (HCC) 08/05/2016   Acute respiratory failure (HCC) 07/21/2016   Hypertensive emergency 07/21/2016   Acute kidney injury (HCC) 07/21/2016   Anasarca    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   Acid reflux 04/07/2013   Class 2 obesity due to excess calories with body mass index (BMI) of 39.0 to 39.9 in adult 04/07/2013   Coronary artery disease 04/07/2013   Kidney stone    Non-STEMI (non-ST elevated myocardial infarction) (HCC) 04/06/2013   Essential hypertension, benign 04/06/2013   PCP:  Cook, Jayce G,  DO Pharmacy:   [facility] [phone] [place], [place] - [address] AT [facility] OF [address] [place] [address] [facility] [place] [address] Phone: [phone] Fax: [phone]     Social Drivers of Health (SDOH) Social History: SDOH Screenings   Tobacco Use: Low Risk  (09/12/2023)   SDOH Interventions:     Readmission Risk Interventions     No data to display

## 2023-09-12 NOTE — ED Triage Notes (Addendum)
 Patient presents with bilateral leg swelling, abdominal swelling and rash on bilateral arms and back. States swelling has been going on for weeks.   History of stroke, CHF and MI  A&O x4 during triage

## 2023-09-12 NOTE — H&P (Addendum)
 History and Physical    Patient: Joshua [name] ZOX:[id number] DOB: 10/31/1960 DOA: 09/12/2023 DOS: the patient was seen and examined on 09/12/2023 PCP: Cook, Jayce G, DO  Patient coming from: Home - lives with a friend; NOK: Levonne Rear, [phone]   Chief Complaint: Anasarca  HPI: Joshua [name] is a 63 y.o. male with medical history significant of CAD s/p CABG, chronic combined CHF (EF 35-40% and grade 1 DD in 12/2020), and HTN who presented on 6/20 with anasarca.  He reports that his legs were so swollen that he was unable to walk.  He has been out of his medicine for a long time, I ain't got no money.  He feels like he could drink a 2L ginger ale and be ready to go home because the Lasix  he was given is working so well already.    ER Course:  CHF exacerbation.  Cannot afford medications, has not had in a while.  SOB, very swollen.  CXR with fluids.  Given 80mg  Lasix .     Review of Systems: As mentioned in the history of present illness. All other systems reviewed and are negative. 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   LIMA to LAD, SVG to OM2, SVG to OM3, SVG to PDA, EVH via right thigh and bilateral lower legs   Past Surgical History:  Procedure Laterality Date   CORONARY ARTERY BYPASS GRAFT N/A 04/09/2013   Procedure: CORONARY ARTERY BYPASS GRAFTING (CABG);  Surgeon: Gardenia Jump, MD;  Location: [facility] OR;  Service: Open Heart Surgery;  Laterality: N/A;  CABG x four,  using left internal mammary artery and right and left leg greater saphenous vein harvested endoscopically   INTRAOPERATIVE TRANSESOPHAGEAL ECHOCARDIOGRAM N/A 04/09/2013   Procedure: INTRAOPERATIVE TRANSESOPHAGEAL ECHOCARDIOGRAM;  Surgeon: Gardenia Jump, MD;  Location: [facility] OR;  Service:  Open Heart Surgery;  Laterality: N/A;   IR ANGIO INTRA EXTRACRAN SEL COM CAROTID INNOMINATE UNI R MOD SED  01/09/2021   IR CT HEAD LTD  01/15/2021   IR CT HEAD LTD  01/15/2021   IR INTRA CRAN STENT  01/15/2021   IR PERCUTANEOUS ART THROMBECTOMY/INFUSION INTRACRANIAL INC DIAG ANGIO  01/09/2021   IR US  GUIDE VASC ACCESS RIGHT  01/09/2021   RADIOLOGY WITH ANESTHESIA N/A 01/09/2021   Procedure: IR WITH ANESTHESIA;  Surgeon: Luellen Sages, MD;  Location: [facility] OR;  Service: Radiology;  Laterality: N/A;   RIGHT/LEFT HEART CATH AND CORONARY/GRAFT ANGIOGRAPHY N/A 07/26/2016   Procedure: Right/Left Heart Cath and Coronary/Graft Angiography;  Surgeon: Mardell Shade, MD;  Location: [facility] INVASIVE CV LAB;  Service: Cardiovascular;  Laterality: N/A;   Social History:  reports that he has never smoked. He has never used smokeless tobacco. He reports current drug use. Drug: Marijuana. He reports that he does not drink alcohol.  No Known Allergies  Family History  Problem Relation Age of Onset   Cancer Mother    Coronary artery disease Father    Hypertension Father    Heart attack Paternal Uncle     Prior to Admission medications   Medication Sig Start Date End Date Taking? Authorizing Provider  acetaminophen  (TYLENOL ) 325 MG tablet Take 2 tablets (650 mg total) by mouth every 4 (four) hours as needed  for mild pain (or temp > 37.5 C (99.5 F)). 01/14/21   Laymond Priestly, NP  aspirin  81 MG chewable tablet Chew 1 tablet (81 mg total) by mouth daily. 01/14/21   Laymond Priestly, NP  atorvastatin  (LIPITOR ) 80 MG tablet Take 1 tablet (80 mg total) by mouth daily. 01/17/21   Angiulli, Everlyn Hockey, PA-C  carvedilol  (COREG ) 3.125 MG tablet Take 1 tablet (3.125 mg total) by mouth 2 (two) times daily with a meal. 01/17/21   Angiulli, Everlyn Hockey, PA-C  docusate sodium  (COLACE) 100 MG capsule Take 1 capsule (100 mg total) by mouth daily. 01/14/21   Laymond Priestly, NP  furosemide  (LASIX ) 40 MG tablet Take 1 tablet  (40 mg total) by mouth daily. 01/17/21   Angiulli, Everlyn Hockey, PA-C  hydrALAZINE  (APRESOLINE ) 10 MG tablet Take 1 tablet (10 mg total) by mouth every 8 (eight) hours. 01/17/21   Angiulli, Everlyn Hockey, PA-C  spironolactone  (ALDACTONE ) 25 MG tablet Take 1 tablet (25 mg total) by mouth daily. 01/17/21   Angiulli, Everlyn Hockey, PA-C  ticagrelor  (BRILINTA ) 90 MG TABS tablet Take 1 tablet (90 mg total) by mouth 2 (two) times daily. 01/17/21   Sterling Eisenmenger, PA-C    Physical Exam: Vitals:   09/12/23 1159 09/12/23 1200  BP:  (!) 153/114  Pulse:  91  Resp:  (!) 22  Temp:  97.7 F (36.5 C)  TempSrc:  Oral  SpO2: 98% 97%  Weight:  131.5 kg  Height:  6' (1.829 m)   General:  Appears calm and comfortable and is in NAD, ambulatory on RA Eyes:  EOMI, normal lids, iris ENT:  grossly normal hearing, lips & tongue, mmm Cardiovascular:  RRR. 3+ taut LE edema.  Respiratory:   CTA bilaterally with no wheezes/rales/rhonchi.  Normal to mildly increased respiratory effort. Abdomen:  soft, NT, ND; abdominal wall edema vs. habitus Skin:  no rash or induration seen on limited exam; chronic fungal infection of BLE Musculoskeletal:  grossly normal tone BUE/BLE, good ROM, no bony abnormality Psychiatric:  grossly normal mood and affect, speech fluent and appropriate, AOx3 Neurologic:  CN 2-12 grossly intact, moves all extremities in coordinated fashion   Radiological Exams on Admission: Independently reviewed - see discussion in A/P where applicable  No results found.  EKG: Independently reviewed.  NSR with rate 81; nonspecific ST changes with no evidence of acute ischemia   Labs on Admission: I have personally reviewed the available labs and imaging studies at the time of the admission.  Pertinent labs:    Glucose 108 BUN 20/Creatinine 1.25/GFR >60 BNP 638.6 HS troponin 29 WBC 7.9 Hgb 12.7   Assessment and Plan: Principal Problem:   Acute on chronic combined systolic (congestive) and diastolic  (congestive) heart failure (HCC) Active Problems:   Essential hypertension, benign   Class 2 obesity due to excess calories with body mass index (BMI) of 39.0 to 39.9 in adult   S/P CABG x 4   Dyslipidemia   DNR (do not resuscitate)    Acute on chronic combined CHF Patient with known h/o chronic combined CHF presenting with worsening edema in the setting of medication noncompliance -CXR consistent with mild pulmonary edema Mildly elevated BNP Acute decompensated CHF seems probable as diagnosis Will place in observation status with telemetry, as there are no current findings necessitating admission (hemodynamic instability, severe electrolyte abnormalities, cardiac arrhythmias, ACS, severe pulmonary edema requiring new O2 therapy, AMS) Continue ASA Will request echocardiogram CHF order set utilized; may need CHF  team consult but will hold until Echo results are available Was given Lasix  80 mg x 1 in ER and will repeat with 40 mg IV BID Resume carvedilol , spironolactone  On RA at this time Normal kidney function at this time, will follow Mildly elevated HS troponin is likely related to demand ischemia; doubt ACS based on symptoms  HTN Continue/resume carvedilol  Will also add prn hydralazine   HLD Continue/resume Lipitor   CAD He appears to have been lost to f/u since his 12/2020 hospitalization No longer taking Brilinta   Class 2 Obesity Body mass index is 39.33 kg/m.Joshua [name]  Weight loss should be encouraged Outpatient PCP/bariatric medicine f/u encouraged Significantly low or high BMI is associated with higher medical risk including morbidity and mortality   Medication noncompliance Patient appears to have been lost to f/u since 12/2020 hospitalization He acknowledges not taking medications He reports that he lost his insurance when he couldn't work his usual job after the hospitalization and cannot afford meds TOC team consulted  DNR I have discussed code status with the  patient and he would not desire resuscitation and would prefer to die a natural death should that situation arise.       Advance Care Planning:   Code Status: Limited: Do not attempt resuscitation (DNR) -DNR-LIMITED -Do Not Intubate/DNI    Consults: TOC team  DVT Prophylaxis: Lovenox   Family Communication: None present; he declined to have me call family at the time of admission  Severity of Illness: The appropriate patient status for this patient is OBSERVATION. Observation status is judged to be reasonable and necessary in order to provide the required intensity of service to ensure the patient's safety. The patient's presenting symptoms, physical exam findings, and initial radiographic and laboratory data in the context of their medical condition is felt to place them at decreased risk for further clinical deterioration. Furthermore, it is anticipated that the patient will be medically stable for discharge from the hospital within 2 midnights of admission.   Author: Lorita Rosa, MD 09/12/2023 2:38 PM  For on call review [email or web address].

## 2023-09-12 NOTE — ED Provider Notes (Signed)
 [facility] Provider Note    Event Date/Time   First MD Initiated Contact with Patient 09/12/23 1219     (approximate)   History   Chief Complaint Shortness of Breath and Leg Swelling   HPI  Joshua [name] is a 63 y.o. male with past medical history of hypertension, CAD, CHF, and stroke who presents to the ED complaining of shortness of breath.  Patient reports that he has been dealing with increasing swelling across much of his body, particularly his legs and his abdomen, for the past couple of weeks.  This been associated with increasing difficulty breathing, particularly with exertion and when he tries to lay flat.  He denies any fevers or cough and has not had any pain in his chest.  He does admit that he has been out of his medications for a long period of time because he cannot afford them.  He has not been seen by cardiology in a couple of years.     Physical Exam   Triage Vital Signs: ED Triage Vitals  Encounter Vitals Group     BP 09/12/23 1200 (!) 153/114     Girls Systolic BP Percentile --      Girls Diastolic BP Percentile --      Boys Systolic BP Percentile --      Boys Diastolic BP Percentile --      Pulse Rate 09/12/23 1200 91     Resp 09/12/23 1200 (!) 22     Temp 09/12/23 1200 97.7 F (36.5 C)     Temp Source 09/12/23 1200 Oral     SpO2 09/12/23 1159 98 %     Weight 09/12/23 1200 290 lb (131.5 kg)     Height 09/12/23 1200 6' (1.829 m)     Head Circumference --      Peak Flow --      Pain Score 09/12/23 1200 5     Pain Loc --      Pain Education --      Exclude from Growth Chart --     Most recent vital signs: Vitals:   09/12/23 1159 09/12/23 1200  BP:  (!) 153/114  Pulse:  91  Resp:  (!) 22  Temp:  97.7 F (36.5 C)  SpO2: 98% 97%    Constitutional: Alert and oriented. Eyes: Conjunctivae are normal. Head: Atraumatic. Nose: No congestion/rhinnorhea. Mouth/Throat: Mucous membranes are moist.  Cardiovascular: Normal  rate, regular rhythm. Grossly normal heart sounds.  2+ radial pulses bilaterally. Respiratory: Normal respiratory effort.  No retractions. Lungs CTAB. Gastrointestinal: Soft and nontender.  Pitting edema across his entire abdominal wall. Musculoskeletal: No lower extremity tenderness, 2+ pitting edema to thighs bilaterally. Neurologic:  Normal speech and language. No gross focal neurologic deficits are appreciated.    ED Results / Procedures / Treatments   Labs (all labs ordered are listed, but only abnormal results are displayed) Labs Reviewed  BASIC METABOLIC PANEL WITH GFR - Abnormal; Notable for the following components:      Result Value   Glucose, Bld 108 (*)    Creatinine, Ser 1.25 (*)    Calcium  8.6 (*)    All other components within normal limits  CBC - Abnormal; Notable for the following components:   Hemoglobin 12.7 (*)    MCH 24.4 (*)    MCHC 28.9 (*)    RDW 20.9 (*)    All other components within normal limits  BRAIN NATRIURETIC PEPTIDE - Abnormal; Notable for the following  components:   B Natriuretic Peptide 638.6 (*)    All other components within normal limits  TROPONIN I (HIGH SENSITIVITY) - Abnormal; Notable for the following components:   Troponin I (High Sensitivity) 29 (*)    All other components within normal limits     EKG  ED ECG REPORT I, Twilla Galea, the attending physician, personally viewed and interpreted this ECG.   Date: 09/12/2023  EKG Time: 12:10  Rate: 81  Rhythm: normal sinus rhythm  Axis: LAD  Intervals:none  ST&T Change: None  RADIOLOGY Chest x-ray reviewed and interpreted by me with cardiomegaly, pulmonary edema, and right-sided pleural effusion.  PROCEDURES:  Critical Care performed: No  Procedures   MEDICATIONS ORDERED IN ED: Medications  furosemide  (LASIX ) injection 80 mg (80 mg Intravenous Given 09/12/23 1311)     IMPRESSION / MDM / ASSESSMENT AND PLAN / ED COURSE  I reviewed the triage vital signs and the  nursing notes.                              63 y.o. male with past medical history of hypertension, CAD, CHF, and stroke who presents to the ED complaining of increasing difficulty breathing for the past 2 weeks after being unable to afford his medications.  Patient's presentation is most consistent with acute presentation with potential threat to life or bodily function.  Differential diagnosis includes, but is not limited to, CHF exacerbation, ACS, PE, pneumonia, anemia, electrolyte abnormality, AKI.  Patient chronically ill but nontoxic-appearing and in no acute distress, vital signs remarkable for hypertension and mild tachypnea.  He is not in any respiratory distress and maintaining oxygen saturations at 97% on room air.  He does appear significantly fluid overloaded and chest x-ray concerning for pulmonary edema with right-sided effusion by my read.  BNP elevated consistent with CHF, troponin pending at this time.  No significant anemia, leukocytosis, electrolyte abnormality, or AKI noted.  We will diurese with IV Lasix  and reassess.  Troponin mildly elevated which seems consistent with CHF exacerbation.  Patient would benefit from admission given significant edema and case discussed with hospitalist.      FINAL CLINICAL IMPRESSION(S) / ED DIAGNOSES   Final diagnoses:  Acute on chronic combined systolic and diastolic congestive heart failure (HCC)     Rx / DC Orders   ED Discharge Orders     None        Note:  This document was prepared using Dragon voice recognition software and may include unintentional dictation errors.   Twilla Galea, MD 09/12/23 [id number]

## 2023-09-12 NOTE — Progress Notes (Signed)
  Echocardiogram 2D Echocardiogram has been performed.  Dione Franks 09/12/2023, 5:06 PM

## 2023-09-12 NOTE — Progress Notes (Signed)
 PHARMACIST - PHYSICIAN COMMUNICATION  CONCERNING:  Enoxaparin  (Lovenox ) for DVT Prophylaxis    RECOMMENDATION: Patient was prescribed enoxaprin 40mg  q24 hours for VTE prophylaxis.   Filed Weights   09/12/23 1200  Weight: 131.5 kg (290 lb)    Body mass index is 39.33 kg/m.  Estimated Creatinine Clearance: 84.9 mL/min (A) (by C-G formula based on SCr of 1.25 mg/dL (H)).   Based on [facility] policy patient is candidate for enoxaparin  0.5mg /kg TBW SQ every 24 hours based on BMI being >30.  DESCRIPTION: Pharmacy has adjusted enoxaparin  dose per [facility] policy.  Patient is now receiving enoxaparin  65 mg every 24 hours    Ramonita Burow, PharmD Clinical Pharmacist  09/12/2023 2:39 PM

## 2023-09-13 DIAGNOSIS — I509 Heart failure, unspecified: Secondary | ICD-10-CM

## 2023-09-13 LAB — CBC
HCT: 41.2 % (ref 39.0–52.0)
Hemoglobin: 12.2 g/dL — ABNORMAL LOW (ref 13.0–17.0)
MCH: 24.5 pg — ABNORMAL LOW (ref 26.0–34.0)
MCHC: 29.6 g/dL — ABNORMAL LOW (ref 30.0–36.0)
MCV: 82.7 fL (ref 80.0–100.0)
Platelets: 228 10*3/uL (ref 150–400)
RBC: 4.98 MIL/uL (ref 4.22–5.81)
RDW: 20.9 % — ABNORMAL HIGH (ref 11.5–15.5)
WBC: 7 10*3/uL (ref 4.0–10.5)
nRBC: 0 % (ref 0.0–0.2)

## 2023-09-13 LAB — BASIC METABOLIC PANEL WITH GFR
Anion gap: 10 (ref 5–15)
BUN: 18 mg/dL (ref 8–23)
CO2: 24 mmol/L (ref 22–32)
Calcium: 8.3 mg/dL — ABNORMAL LOW (ref 8.9–10.3)
Chloride: 103 mmol/L (ref 98–111)
Creatinine, Ser: 1.34 mg/dL — ABNORMAL HIGH (ref 0.61–1.24)
GFR, Estimated: 60 mL/min — ABNORMAL LOW (ref >60)
Glucose, Bld: 103 mg/dL — ABNORMAL HIGH (ref 70–99)
Potassium: 3.7 mmol/L (ref 3.5–5.1)
Sodium: 137 mmol/L (ref 135–145)

## 2023-09-13 MED ORDER — FUROSEMIDE 10 MG/ML IJ SOLN
80.0000 mg | Freq: Two times a day (BID) | INTRAMUSCULAR | Status: DC
  Administered 2023-09-13 – 2023-09-14 (×3): 80 mg via INTRAVENOUS
  Filled 2023-09-13 (×3): qty 8

## 2023-09-13 MED ORDER — ASPIRIN 81 MG PO TBEC
81.0000 mg | DELAYED_RELEASE_TABLET | Freq: Every day | ORAL | Status: DC
  Administered 2023-09-13 – 2023-09-15 (×3): 81 mg via ORAL
  Filled 2023-09-13 (×3): qty 1

## 2023-09-13 NOTE — Progress Notes (Signed)
 PROGRESS NOTE Joshua [name]    DOB: [birth year], 63 y.o.  FMW:[id number]    Code Status: Limited: Do not attempt resuscitation (DNR) -DNR-LIMITED -Do Not Intubate/DNI    DOA: 09/12/2023   LOS: 0  Brief hospital course  Joshua [name] is a 63 y.o. male with medical history significant of CAD s/p CABG, chronic combined CHF (EF 35-40% and grade 1 DD in 12/2020), and HTN who presented on 6/20 with anasarca with CHF exacerbation secondary to inability to afford CHF medications with loss of his job and insurance  09/13/23 - still significantly fluid overloaded despite IV diuresis and good UOP. Will require ongoing IV diuresis and monitoring   Assessment & Plan  Principal Problem:   Acute on chronic combined systolic (congestive) and diastolic (congestive) heart failure (HCC) Active Problems:   Essential hypertension, benign   Class 2 obesity due to excess calories with body mass index (BMI) of 39.0 to 39.9 in adult   S/P CABG x 4   Dyslipidemia   DNR (do not resuscitate)  Acute on chronic combined CHF -CXR consistent with mild pulmonary edema Mildly elevated BNP - f/u echocardiogram - continue IV diuresis, will need oral diuretic plan and prescription at dc - daily weights - strict I/O - mobilize - leg compression  Resume carvedilol , spironolactone  On RA at this time at rest - ambulate with pulse ox   HTN Continue/resume carvedilol    CAD  HLD He appears to have been lost to f/u since his 12/2020 hospitalization No longer taking Brilinta  Continue/resume Lipitor    Class 2 Obesity Body mass index is 39.33 kg/m.SABRA  Weight loss should be encouraged Outpatient PCP/bariatric medicine f/u encouraged Significantly low or high BMI is associated with higher medical risk including morbidity and mortality     DNR I have discussed code status with the patient and he would not desire resuscitation and would prefer to die a natural death should that situation arise.  Body mass index is 39.38  kg/m.  VTE ppx: lovenox   Diet:     Diet   Diet Heart Room service appropriate? Yes; Fluid consistency: Thin; Fluid restriction: 1500 mL Fluid   Consultants: None   Subjective 09/13/23    Pt reports SOB with ambulating. Leg swelling improved but still painful. Unable to afford home medications. Endorses significant uop that has not been charted.    Objective  Blood pressure (!) 129/98, pulse 70, temperature 97.7 F (36.5 C), temperature source Oral, resp. rate 20, height 6' (1.829 m), weight 131.7 kg, SpO2 95%.  Intake/Output Summary (Last 24 hours) at 09/13/2023 0756 Last data filed at 09/12/2023 2200 Gross per 24 hour  Intake 203 ml  Output 200 ml  Net 3 ml   Filed Weights   09/12/23 1200 09/13/23 0500  Weight: 131.5 kg 131.7 kg     Physical Exam:  General: awake, alert, NAD HEENT: atraumatic, clear conjunctiva, anicteric sclera, MMM, hearing grossly normal Respiratory: out of breath with conversation, obese abdomen probably contributing as well as rales/crackles observed Cardiovascular: quick capillary refill, normal S1/S2, RRR, no JVD, murmurs Gastrointestinal: soft, NT, ND Nervous: A&O x3. no gross focal neurologic deficits Extremities: 3+ pitting edema bilateral LE Skin: dry, intact, normal temperature, normal color. No rashes, lesions or ulcers on exposed skin Psychiatry: normal mood, congruent affect  Labs   I have personally reviewed the following labs and imaging studies CBC    Component Value Date/Time   WBC 7.9 09/12/2023 1202   RBC 5.20 09/12/2023 1202   HGB 12.7 (  L) 09/12/2023 1202   HGB 15.9 04/07/2013 0642   HCT 43.9 09/12/2023 1202   HCT 47.1 04/07/2013 0642   PLT 228 09/12/2023 1202   PLT 228 04/07/2013 0642   MCV 84.4 09/12/2023 1202   MCV 84 04/07/2013 0642   MCH 24.4 (L) 09/12/2023 1202   MCHC 28.9 (L) 09/12/2023 1202   RDW 20.9 (H) 09/12/2023 1202   RDW 13.7 04/07/2013 0642   LYMPHSABS 1.8 01/15/2021 0518   LYMPHSABS 1.6 04/07/2013  0642   MONOABS 0.8 01/15/2021 0518   MONOABS 0.6 04/07/2013 0642   EOSABS 0.1 01/15/2021 0518   EOSABS 0.0 04/07/2013 0642   BASOSABS 0.0 01/15/2021 0518   BASOSABS 0.1 04/07/2013 0642      Latest Ref Rng & Units 09/12/2023   12:02 PM 01/17/2021    5:03 AM 01/15/2021    5:18 AM  BMP  Glucose 70 - 99 mg/dL 891  899  889   BUN 8 - 23 mg/dL 20  27  27    Creatinine 0.61 - 1.24 mg/dL 8.74  8.88  8.76   Sodium 135 - 145 mmol/L 140  135  135   Potassium 3.5 - 5.1 mmol/L 4.1  4.3  3.8   Chloride 98 - 111 mmol/L 108  102  101   CO2 22 - 32 mmol/L 26  20  25    Calcium  8.9 - 10.3 mg/dL 8.6  9.1  9.1     ECHOCARDIOGRAM COMPLETE Result Date: 09/12/2023    ECHOCARDIOGRAM REPORT   Patient Name:   Joshua [name] Date of Exam: 09/12/2023 Medical Rec #:  [id number]   Height:       72.0 in Accession #:    [id number]  Weight:       290.0 lb Date of Birth:  03-May-1960   BSA:          2.495 m Patient Age:    63 years    BP:           153/114 mmHg Patient Gender: M           HR:           77 bpm. Exam Location:  [facility] Procedure: 2D Echo (Both Spectral and Color Flow Doppler were utilized during            procedure). Indications:     acute systolic chf  History:         Patient has prior history of Echocardiogram examinations, most                  recent 01/10/2021. CHF, Prior CABG, Signs/Symptoms:Edema and                  Shortness of Breath; Risk Factors:Hypertension.  Sonographer:     Tinnie Barefoot RDCS Referring Phys:  [id number] DELON YATES Diagnosing Phys: Evalene Lunger MD  Sonographer Comments: Patient is obese. Image acquisition challenging due to patient body habitus. IMPRESSIONS  1. Left ventricular ejection fraction, by estimation, is 35 to 40%. The left ventricle has moderately decreased function. The left ventricle has no regional wall motion abnormalities. The left ventricular internal cavity size was mildly dilated. There is mild left ventricular hypertrophy. Left ventricular diastolic parameters are  indeterminate.  2. Right ventricular systolic function is normal. The right ventricular size is normal. There is mildly elevated pulmonary artery systolic pressure. The estimated right ventricular systolic pressure is 39.4 mmHg.  3. The mitral valve is normal in structure. No evidence of mitral  valve regurgitation. No evidence of mitral stenosis.  4. The aortic valve is normal in structure. Aortic valve regurgitation is not visualized. Aortic valve sclerosis/calcification is present, without any evidence of aortic stenosis.  5. The inferior vena cava is normal in size with greater than 50% respiratory variability, suggesting right atrial pressure of 3 mmHg. FINDINGS  Left Ventricle: Left ventricular ejection fraction, by estimation, is 35 to 40%. The left ventricle has moderately decreased function. The left ventricle has no regional wall motion abnormalities. Strain was performed and the global longitudinal strain is indeterminate. The left ventricular internal cavity size was mildly dilated. There is mild left ventricular hypertrophy. Left ventricular diastolic parameters are indeterminate. Right Ventricle: The right ventricular size is normal. No increase in right ventricular wall thickness. Right ventricular systolic function is normal. There is mildly elevated pulmonary artery systolic pressure. The tricuspid regurgitant velocity is 2.71  m/s, and with an assumed right atrial pressure of 10 mmHg, the estimated right ventricular systolic pressure is 39.4 mmHg. Left Atrium: Left atrial size was normal in size. Right Atrium: Right atrial size was normal in size. Pericardium: There is no evidence of pericardial effusion. Mitral Valve: The mitral valve is normal in structure. No evidence of mitral valve regurgitation. No evidence of mitral valve stenosis. Tricuspid Valve: The tricuspid valve is normal in structure. Tricuspid valve regurgitation is mild . No evidence of tricuspid stenosis. Aortic Valve: The aortic  valve is normal in structure. Aortic valve regurgitation is not visualized. Aortic valve sclerosis/calcification is present, without any evidence of aortic stenosis. Pulmonic Valve: The pulmonic valve was normal in structure. Pulmonic valve regurgitation is not visualized. No evidence of pulmonic stenosis. Aorta: The aortic root is normal in size and structure. Venous: The inferior vena cava is normal in size with greater than 50% respiratory variability, suggesting right atrial pressure of 3 mmHg. IAS/Shunts: No atrial level shunt detected by color flow Doppler. Additional Comments: 3D was performed not requiring image post processing on an independent workstation and was indeterminate.  LEFT VENTRICLE PLAX 2D LVIDd:         6.20 cm      Diastology LVIDs:         5.00 cm      LV e' medial:    5.55 cm/s LV PW:         1.20 cm      LV E/e' medial:  23.2 LV IVS:        1.20 cm      LV e' lateral:   8.81 cm/s LVOT diam:     1.90 cm      LV E/e' lateral: 14.6 LV SV:         52 LV SV Index:   21 LVOT Area:     2.84 cm  LV Volumes (MOD) LV vol d, MOD A2C: 198.0 ml LV vol s, MOD A2C: 126.0 ml LV SV MOD A2C:     72.0 ml RIGHT VENTRICLE            IVC RV Basal diam:  3.90 cm    IVC diam: 2.80 cm RV S prime:     7.83 cm/s TAPSE (M-mode): 1.3 cm LEFT ATRIUM             Index        RIGHT ATRIUM           Index LA diam:        4.70 cm 1.88 cm/m   RA Area:  26.90 cm LA Vol (A2C):   78.0 ml 31.26 ml/m  RA Volume:   97.20 ml  38.96 ml/m LA Vol (A4C):   67.1 ml 26.89 ml/m LA Biplane Vol: 72.3 ml 28.98 ml/m  AORTIC VALVE LVOT Vmax:   104.00 cm/s LVOT Vmean:  67.400 cm/s LVOT VTI:    0.184 m  AORTA Ao Root diam: 3.10 cm Ao Asc diam:  3.10 cm MITRAL VALVE                TRICUSPID VALVE MV Area (PHT): 5.02 cm     TR Peak grad:   29.4 mmHg MV Decel Time: 151 msec     TR Vmax:        271.00 cm/s MV E velocity: 129.00 cm/s MV A velocity: 41.20 cm/s   SHUNTS MV E/A ratio:  3.13         Systemic VTI:  0.18 m                              Systemic Diam: 1.90 cm Evalene Lunger MD Electronically signed by Evalene Lunger MD Signature Date/Time: 09/12/2023/5:23:09 PM    Final    DG Chest 2 View Result Date: 09/12/2023 CLINICAL DATA:  Shortness of breath EXAM: CHEST - 2 VIEW COMPARISON:  01/09/2021 FINDINGS: Post sternotomy changes. Cardiomegaly with vascular congestion and mild interstitial pulmonary edema. Small bilateral pleural effusions. More focal airspace disease at the right base. Aortic atherosclerosis. No pneumothorax IMPRESSION: 1. Cardiomegaly with vascular congestion and mild interstitial pulmonary edema. Small bilateral pleural effusions. 2. More focal airspace disease at the right base may be due to atelectasis or pneumonia. Electronically Signed   By: Luke Bun M.D.   On: 09/12/2023 16:10    Disposition Plan & Communication  Patient status: Observation  Admitted From: Home Planned disposition location: Home Anticipated discharge date: 6/21 pending clinical improvement   Family Communication: none    Author: Marien LITTIE Piety, DO [facility] 09/13/2023, 7:56 AM   Available by Epic secure chat 7AM-7PM. If 7PM-7AM, please contact night-coverage.  [facility] contact information found on [email or web address].

## 2023-09-13 NOTE — Plan of Care (Signed)
  Problem: Education: Goal: Knowledge of General Education information will improve Description: Including pain rating scale, medication(s)/side effects and non-pharmacologic comfort measures Outcome: Progressing   Problem: Clinical Measurements: Goal: Ability to maintain clinical measurements within normal limits will improve Outcome: Progressing   Problem: Clinical Measurements: Goal: Will remain free from infection Outcome: Progressing   Problem: Clinical Measurements: Goal: Diagnostic test results will improve Outcome: Progressing   Problem: Clinical Measurements: Goal: Respiratory complications will improve Outcome: Progressing   Problem: Clinical Measurements: Goal: Cardiovascular complication will be avoided Outcome: Progressing   Problem: Activity: Goal: Risk for activity intolerance will decrease Outcome: Progressing   Problem: Pain Managment: Goal: General experience of comfort will improve and/or be controlled Outcome: Progressing   Problem: Safety: Goal: Ability to remain free from injury will improve Outcome: Progressing   Problem: Cardiac: Goal: Ability to achieve and maintain adequate cardiopulmonary perfusion will improve Outcome: Progressing   Plan of care, assessment, treatment, monitoring, and intervention (s)  ongoing, see MAR see flowsheet

## 2023-09-13 NOTE — Plan of Care (Signed)

## 2023-09-14 LAB — BASIC METABOLIC PANEL WITH GFR
Anion gap: 11 (ref 5–15)
BUN: 22 mg/dL (ref 8–23)
CO2: 26 mmol/L (ref 22–32)
Calcium: 8.6 mg/dL — ABNORMAL LOW (ref 8.9–10.3)
Chloride: 100 mmol/L (ref 98–111)
Creatinine, Ser: 1.42 mg/dL — ABNORMAL HIGH (ref 0.61–1.24)
GFR, Estimated: 56 mL/min — ABNORMAL LOW (ref >60)
Glucose, Bld: 147 mg/dL — ABNORMAL HIGH (ref 70–99)
Potassium: 3.3 mmol/L — ABNORMAL LOW (ref 3.5–5.1)
Sodium: 137 mmol/L (ref 135–145)

## 2023-09-14 MED ORDER — POTASSIUM CHLORIDE CRYS ER 20 MEQ PO TBCR
40.0000 meq | EXTENDED_RELEASE_TABLET | Freq: Two times a day (BID) | ORAL | Status: AC
  Administered 2023-09-14 (×2): 40 meq via ORAL
  Filled 2023-09-14 (×2): qty 2

## 2023-09-14 MED ORDER — FUROSEMIDE 10 MG/ML IJ SOLN
40.0000 mg | Freq: Two times a day (BID) | INTRAMUSCULAR | Status: DC
  Administered 2023-09-14 – 2023-09-15 (×2): 40 mg via INTRAVENOUS
  Filled 2023-09-14 (×2): qty 4

## 2023-09-14 MED ORDER — METHOCARBAMOL 500 MG PO TABS
500.0000 mg | ORAL_TABLET | Freq: Four times a day (QID) | ORAL | Status: DC | PRN
  Administered 2023-09-14 – 2023-09-15 (×2): 500 mg via ORAL
  Filled 2023-09-14 (×2): qty 1

## 2023-09-14 NOTE — Plan of Care (Signed)
  Problem: Education: Goal: Knowledge of General Education information will improve Description: Including pain rating scale, medication(s)/side effects and non-pharmacologic comfort measures Outcome: Progressing   Problem: Clinical Measurements: Goal: Ability to maintain clinical measurements within normal limits will improve Outcome: Progressing   Problem: Clinical Measurements: Goal: Will remain free from infection Outcome: Progressing   Problem: Clinical Measurements: Goal: Diagnostic test results will improve Outcome: Progressing   Problem: Clinical Measurements: Goal: Respiratory complications will improve Outcome: Progressing   Problem: Clinical Measurements: Goal: Cardiovascular complication will be avoided Outcome: Progressing   Problem: Activity: Goal: Risk for activity intolerance will decrease Outcome: Progressing   Problem: Coping: Goal: Level of anxiety will decrease Outcome: Progressing   Problem: Elimination: Goal: Will not experience complications related to bowel motility Outcome: Progressing   Problem: Elimination: Goal: Will not experience complications related to urinary retention Outcome: Progressing   Problem: Skin Integrity: Goal: Risk for impaired skin integrity will decrease Outcome: Progressing   Problem: Safety: Goal: Ability to remain free from injury will improve Outcome: Progressing   Problem: Cardiac: Goal: Ability to achieve and maintain adequate cardiopulmonary perfusion will improve Outcome: Progressing   Plan of care, assessment, treatment, monitoring, and intervention (s) ongoing, see MAR see flowsheet

## 2023-09-14 NOTE — Discharge Instructions (Addendum)
 [facility]'s low-cost Prescription Program Prescriptions starting at $4 Get access to select generic drugs at commonly prescribed dosages. 30-day supply of medications starting as low as $4. 30-day supply of medications starting as low as $4. 90-day supply of medications starting as low as $10. 90-day supply of medications starting as low as $10. Prices for some drugs covered by the Prescription Program may vary by state including, but not necessarily limited to, [place] & [place].  Prices for some drugs covered by the Prescription Program may vary by state including, but not necessarily limited to, [place] & [place]. Check with your local [facility] for details.     [facility] [address] [place], [place] [address] [phone]

## 2023-09-14 NOTE — Progress Notes (Signed)
 SATURATION QUALIFICATIONS: (This note is used to comply with regulatory documentation for home oxygen)  Patient Saturations on Room Air at Rest = 93%  Patient Saturations on Room Air while Ambulating = 88%  Patient Saturations on 0 Liters of oxygen while Ambulating = 88%  Please briefly explain why patient needs home oxygen:  Patient did 2 laps around the nurses station, his oxygen stayed above 88% on the first lap,however started dropping  upto 80% around middle of second lap, also SPO2 came upto 92% right after he sat down.

## 2023-09-14 NOTE — Progress Notes (Signed)
 PROGRESS NOTE Joshua [name]    DOB: October 26, 1960, 63 y.o.  FMW:[id number]    Code Status: Limited: Do not attempt resuscitation (DNR) -DNR-LIMITED -Do Not Intubate/DNI    DOA: 09/12/2023   LOS: 1  Brief hospital course  Joshua [name] is a 63 y.o. 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 Will require ongoing IV diuresis and monitoring. Desat to 80s with ambulation. Will reevaluate again tomorrow to assess whether he needs O2 going home.   Assessment & Plan  Principal Problem:   Acute on chronic combined systolic (congestive) and diastolic (congestive) heart failure (HCC) Active Problems:   Essential hypertension, benign   Class 2 obesity due to excess calories with body mass index (BMI) of 39.0 to 39.9 in adult   S/P CABG x 4   Dyslipidemia   DNR (do not resuscitate)   CHF exacerbation (HCC)  Acute on chronic combined CHF -CXR consistent with mild pulmonary edema Mildly elevated BNP. Echo- EF 35-40% - continue IV diuresis, will need oral diuretic plan and prescription at dc - daily weights - strict I/O - mobilize - leg compression  Resume carvedilol , spironolactone  On RA at this time at rest - ambulate with pulse ox shows hypoxia- will attempt again tomorrow.  - had slight increase in creatinine- decreasing dose of lasix  and will recheck Cr tomorrow  Mild hypokalemia- as result of fluid shift/diuresis. S/p replacement - BMP am and replace as needed   LE cramping overnight- eating mustard.  Trial of muscle relaxers  HTN Continue/resume carvedilol    CAD  HLD He appears to have been lost to f/u since his 12/2020 hospitalization No longer taking Brilinta  Continue/resume Lipitor     Class 2 Obesity Body mass index is 39.33 kg/m.SABRA  Weight loss should be encouraged Outpatient PCP/bariatric medicine f/u encouraged Significantly low or high BMI is associated with higher medical risk including morbidity and mortality     DNR I have discussed code status with the patient and he would not desire resuscitation and would prefer to die a natural death should that situation arise.  Body mass index is 43.86 kg/m.  VTE ppx: lovenox   Diet:     Diet   Diet Heart Room service appropriate? Yes; Fluid consistency: Thin; Fluid restriction: 1500 mL Fluid   Consultants: None   Subjective 09/14/23    Pt reports SOB with ambulating. Leg swelling improved but still painful. Describes significant calf cramping overnight improved with eating mustard.    Objective  Blood pressure (!) 129/98, pulse 70, temperature 97.7 F (36.5 C), temperature source Oral, resp. rate 20, height 6' (1.829 m), weight 131.7 kg, SpO2 95%.  Intake/Output Summary (Last 24 hours) at 09/14/2023 0816 Last data filed at 09/14/2023 0700 Gross per 24 hour  Intake 1200 ml  Output 400 ml  Net 800 ml   Filed Weights   09/12/23 1200 09/13/23 0500 09/14/23 0500  Weight: 131.5 kg 131.7 kg (!) 146.7 kg     Physical Exam:  General: awake, alert, NAD HEENT: atraumatic, clear conjunctiva, anicteric sclera, MMM, hearing grossly normal Respiratory: out of breath with conversation, obese abdomen probably contributing as well as rales/crackles observed Cardiovascular: quick capillary refill, normal S1/S2, RRR, no JVD, murmurs Gastrointestinal: soft,  NT, ND. obese Nervous: A&O x3. no gross focal neurologic deficits Extremities: 3+ pitting edema bilateral LE Skin: dry, intact, normal temperature, normal color. No rashes, lesions or ulcers on exposed skin Psychiatry: normal mood, congruent affect  Labs   I have personally reviewed the following labs and imaging studies CBC    Component Value Date/Time   WBC  7.0 09/13/2023 0818   RBC 4.98 09/13/2023 0818   HGB 12.2 (L) 09/13/2023 0818   HGB 15.9 04/07/2013 0642   HCT 41.2 09/13/2023 0818   HCT 47.1 04/07/2013 0642   PLT 228 09/13/2023 0818   PLT 228 04/07/2013 0642   MCV 82.7 09/13/2023 0818   MCV 84 04/07/2013 0642   MCH 24.5 (L) 09/13/2023 0818   MCHC 29.6 (L) 09/13/2023 0818   RDW 20.9 (H) 09/13/2023 0818   RDW 13.7 04/07/2013 0642   LYMPHSABS 1.8 01/15/2021 0518   LYMPHSABS 1.6 04/07/2013 0642   MONOABS 0.8 01/15/2021 0518   MONOABS 0.6 04/07/2013 0642   EOSABS 0.1 01/15/2021 0518   EOSABS 0.0 04/07/2013 0642   BASOSABS 0.0 01/15/2021 0518   BASOSABS 0.1 04/07/2013 0642      Latest Ref Rng & Units 09/14/2023    4:53 AM 09/13/2023    8:18 AM 09/12/2023   12:02 PM  BMP  Glucose 70 - 99 mg/dL 852  896  891   BUN 8 - 23 mg/dL 22  18  20    Creatinine 0.61 - 1.24 mg/dL 8.57  8.65  8.74   Sodium 135 - 145 mmol/L 137  137  140   Potassium 3.5 - 5.1 mmol/L 3.3  3.7  4.1   Chloride 98 - 111 mmol/L 100  103  108   CO2 22 - 32 mmol/L 26  24  26    Calcium  8.9 - 10.3 mg/dL 8.6  8.3  8.6     ECHOCARDIOGRAM COMPLETE Result Date: 09/12/2023    ECHOCARDIOGRAM REPORT   Patient Name:   Joshua [name] Date of Exam: 09/12/2023 Medical Rec #:  [id number]   Height:       72.0 in Accession #:    [id number]  Weight:       290.0 lb Date of Birth:  1960-09-14   BSA:          2.495 m Patient Age:    63 years    BP:           153/114 mmHg Patient Gender: M           HR:           77 bpm. Exam Location:  [facility] Procedure: 2D Echo (Both Spectral and Color Flow Doppler were utilized during            procedure). Indications:     acute systolic chf  History:         Patient has prior history of Echocardiogram examinations, most                  recent 01/10/2021. CHF, Prior CABG, Signs/Symptoms:Edema and                  Shortness of Breath; Risk Factors:Hypertension.  Sonographer:     Tinnie Barefoot RDCS Referring Phys:  [id number] DELON YATES Diagnosing Phys: Evalene Lunger MD  Sonographer Comments: Patient is obese. Image acquisition challenging due to patient body habitus. IMPRESSIONS  1. Left ventricular ejection fraction, by estimation, is 35 to 40%. The left ventricle has moderately decreased function.  The left ventricle has no regional wall motion abnormalities. The left ventricular internal cavity size was mildly dilated. There is mild left ventricular hypertrophy. Left ventricular diastolic parameters are indeterminate.  2. Right ventricular systolic function is normal. The right ventricular size is normal. There is mildly elevated pulmonary artery systolic pressure. The estimated right ventricular systolic pressure is 39.4 mmHg.  3. The mitral valve is normal in structure. No evidence of mitral valve regurgitation. No evidence of mitral stenosis.  4. The aortic valve is normal in structure. Aortic valve regurgitation is not visualized. Aortic valve sclerosis/calcification is present, without any evidence of aortic stenosis.  5. The inferior vena cava is normal in size with greater than 50% respiratory variability, suggesting right atrial pressure of 3 mmHg. FINDINGS  Left Ventricle: Left ventricular ejection fraction, by estimation, is 35 to 40%. The left ventricle has moderately decreased function. The left ventricle has no regional wall motion abnormalities. Strain was performed and the global longitudinal strain is indeterminate. The left ventricular internal cavity size was mildly dilated. There is mild left ventricular hypertrophy. Left ventricular diastolic parameters are indeterminate. Right Ventricle: The right ventricular size is normal. No increase in right ventricular wall thickness. Right ventricular systolic function is normal. There is mildly elevated pulmonary artery systolic pressure. The tricuspid regurgitant velocity is 2.71  m/s, and with an assumed right atrial pressure of 10 mmHg, the estimated right ventricular systolic pressure is 39.4 mmHg. Left  Atrium: Left atrial size was normal in size. Right Atrium: Right atrial size was normal in size. Pericardium: There is no evidence of pericardial effusion. Mitral Valve: The mitral valve is normal in structure. No evidence of mitral valve regurgitation. No evidence of mitral valve stenosis. Tricuspid Valve: The tricuspid valve is normal in structure. Tricuspid valve regurgitation is mild . No evidence of tricuspid stenosis. Aortic Valve: The aortic valve is normal in structure. Aortic valve regurgitation is not visualized. Aortic valve sclerosis/calcification is present, without any evidence of aortic stenosis. Pulmonic Valve: The pulmonic valve was normal in structure. Pulmonic valve regurgitation is not visualized. No evidence of pulmonic stenosis. Aorta: The aortic root is normal in size and structure. Venous: The inferior vena cava is normal in size with greater than 50% respiratory variability, suggesting right atrial pressure of 3 mmHg. IAS/Shunts: No atrial level shunt detected by color flow Doppler. Additional Comments: 3D was performed not requiring image post processing on an independent workstation and was indeterminate.  LEFT VENTRICLE PLAX 2D LVIDd:         6.20 cm      Diastology LVIDs:         5.00 cm      LV e' medial:    5.55 cm/s LV PW:         1.20 cm      LV E/e' medial:  23.2 LV IVS:        1.20 cm      LV e' lateral:   8.81 cm/s LVOT diam:     1.90 cm      LV E/e' lateral: 14.6 LV SV:         52 LV SV Index:   21 LVOT Area:     2.84 cm  LV Volumes (MOD) LV vol d, MOD A2C: 198.0 ml LV vol s, MOD A2C: 126.0 ml LV SV MOD A2C:     72.0 ml RIGHT VENTRICLE            IVC RV Basal diam:  3.90  cm    IVC diam: 2.80 cm RV S prime:     7.83 cm/s TAPSE (M-mode): 1.3 cm LEFT ATRIUM             Index        RIGHT ATRIUM           Index LA diam:        4.70 cm 1.88 cm/m   RA Area:     26.90 cm LA Vol (A2C):   78.0 ml 31.26 ml/m  RA Volume:   97.20 ml  38.96 ml/m LA Vol (A4C):   67.1 ml 26.89 ml/m LA  Biplane Vol: 72.3 ml 28.98 ml/m  AORTIC VALVE LVOT Vmax:   104.00 cm/s LVOT Vmean:  67.400 cm/s LVOT VTI:    0.184 m  AORTA Ao Root diam: 3.10 cm Ao Asc diam:  3.10 cm MITRAL VALVE                TRICUSPID VALVE MV Area (PHT): 5.02 cm     TR Peak grad:   29.4 mmHg MV Decel Time: 151 msec     TR Vmax:        271.00 cm/s MV E velocity: 129.00 cm/s MV A velocity: 41.20 cm/s   SHUNTS MV E/A ratio:  3.13         Systemic VTI:  0.18 m                             Systemic Diam: 1.90 cm Evalene Lunger MD Electronically signed by Evalene Lunger MD Signature Date/Time: 09/12/2023/5:23:09 PM    Final    DG Chest 2 View Result Date: 09/12/2023 CLINICAL DATA:  Shortness of breath EXAM: CHEST - 2 VIEW COMPARISON:  01/09/2021 FINDINGS: Post sternotomy changes. Cardiomegaly with vascular congestion and mild interstitial pulmonary edema. Small bilateral pleural effusions. More focal airspace disease at the right base. Aortic atherosclerosis. No pneumothorax IMPRESSION: 1. Cardiomegaly with vascular congestion and mild interstitial pulmonary edema. Small bilateral pleural effusions. 2. More focal airspace disease at the right base may be due to atelectasis or pneumonia. Electronically Signed   By: Luke Bun M.D.   On: 09/12/2023 16:10    Disposition Plan & Communication  Patient status: Inpatient  Admitted From: Home Planned disposition location: Home Anticipated discharge date: 6/23 pending clinical improvement   Family Communication: none    Author: Marien LITTIE Piety, DO [facility] 09/14/2023, 8:16 AM   Available by Epic secure chat 7AM-7PM. If 7PM-7AM, please contact night-coverage.  [facility] contact information found on [email or web address].

## 2023-09-14 NOTE — Plan of Care (Signed)

## 2023-09-14 NOTE — TOC Progression Note (Signed)
 Transition of Care [facility]) - Progression Note    Patient Details  Name: Joshua [name] MRN: [id number] Date of Birth: [birth year]  [facility] CM/SW Contact  Marinda Cooks, RN Phone Number: 09/14/2023, 4:27 PM  Clinical Narrative:    Per chart review pt HOD#2 not medically cleared to dc today pt continues to be symptomatic evidenced by significant fluid overload , ongoing IV diuresis .Pt Desat to 80s with ambulation. TOC will cont to follow dc planning / care coordination and update as applicable.        Expected Discharge Plan and Services                                               Social Determinants of Health (SDOH) Interventions SDOH Screenings   Food Insecurity: No Food Insecurity (09/12/2023)  Housing: Low Risk  (09/12/2023)  Transportation Needs: No Transportation Needs (09/12/2023)  Utilities: Not At Risk (09/12/2023)  Tobacco Use: Low Risk  (09/12/2023)    Readmission Risk Interventions     No data to display

## 2023-09-15 ENCOUNTER — Other Ambulatory Visit (HOSPITAL_COMMUNITY): Payer: Self-pay

## 2023-09-15 ENCOUNTER — Other Ambulatory Visit: Payer: Self-pay

## 2023-09-15 LAB — BASIC METABOLIC PANEL WITH GFR
Anion gap: 12 (ref 5–15)
BUN: 25 mg/dL — ABNORMAL HIGH (ref 8–23)
CO2: 27 mmol/L (ref 22–32)
Calcium: 8.9 mg/dL (ref 8.9–10.3)
Chloride: 96 mmol/L — ABNORMAL LOW (ref 98–111)
Creatinine, Ser: 1.4 mg/dL — ABNORMAL HIGH (ref 0.61–1.24)
GFR, Estimated: 56 mL/min — ABNORMAL LOW (ref >60)
Glucose, Bld: 119 mg/dL — ABNORMAL HIGH (ref 70–99)
Potassium: 3.6 mmol/L (ref 3.5–5.1)
Sodium: 135 mmol/L (ref 135–145)

## 2023-09-15 MED ORDER — ATORVASTATIN CALCIUM 80 MG PO TABS
80.0000 mg | ORAL_TABLET | Freq: Every day | ORAL | 0 refills | Status: DC
  Filled 2023-09-15: qty 60, 60d supply, fill #0

## 2023-09-15 MED ORDER — ASPIRIN 81 MG PO TBEC
81.0000 mg | DELAYED_RELEASE_TABLET | Freq: Every day | ORAL | 0 refills | Status: AC
  Filled 2023-09-15: qty 60, 60d supply, fill #0

## 2023-09-15 MED ORDER — SPIRONOLACTONE 25 MG PO TABS
25.0000 mg | ORAL_TABLET | Freq: Every day | ORAL | 0 refills | Status: AC
  Filled 2023-09-15: qty 30, 30d supply, fill #0

## 2023-09-15 MED ORDER — BACLOFEN 10 MG PO TABS
10.0000 mg | ORAL_TABLET | Freq: Three times a day (TID) | ORAL | 0 refills | Status: AC | PRN
  Filled 2023-09-15: qty 30, 10d supply, fill #0

## 2023-09-15 MED ORDER — TORSEMIDE 20 MG PO TABS
40.0000 mg | ORAL_TABLET | Freq: Two times a day (BID) | ORAL | 0 refills | Status: AC
  Filled 2023-09-15: qty 60, 15d supply, fill #0

## 2023-09-15 MED ORDER — CARVEDILOL 3.125 MG PO TABS
3.1250 mg | ORAL_TABLET | Freq: Two times a day (BID) | ORAL | 0 refills | Status: AC
  Filled 2023-09-15: qty 120, 60d supply, fill #0

## 2023-09-15 NOTE — Discharge Summary (Signed)
 Physician Discharge Summary  Patient: Joshua [name] FMW:[id number] DOB: 1960/04/18   Code Status: Limited: Do not attempt resuscitation (DNR) -DNR-LIMITED -Do Not Intubate/DNI  Admit date: 09/12/2023 Discharge date: 09/15/2023 Disposition: Home, No home health services recommended PCP: Cook, Jayce G, DO  Recommendations for Outpatient Follow-up:  Follow up with PCP within 1-2 weeks Regarding general hospital follow up and preventative care Recommend monitoring oxygen with ambulation Follow up with cardiology   Discharge Diagnoses:  Principal Problem:   Acute on chronic combined systolic (congestive) and diastolic (congestive) heart failure (HCC) Active Problems:   Essential hypertension, benign   Class 2 obesity due to excess calories with body mass index (BMI) of 39.0 to 39.9 in adult   S/P CABG x 4   Dyslipidemia   DNR (do not resuscitate)   CHF exacerbation [facility])  Brief Hospital Course Summary: Joshua [name] is a 63 y.o. male with medical history significant of CAD s/p CABG, chronic combined CHF (EF 35-40% and grade 1 DD in 12/2020), and HTN who presented on 6/20 with anasarca with CHF exacerbation secondary to inability to afford CHF medications with loss of his job and insurance. Patient with significant hypervolemia which responded well to diuretic therapy. He was ambulated with pulse-ox on day of dc without desat below 90%.  His medications were filled and delivered to him to bedside prior to discharge to assist in adherence. TOC was consulted and provided resources for medication assistance.  He will need PCP follow up to ensure electrolyte monitoring with resuming of chronic diuresis.    All other chronic conditions were treated with home medications.    Discharge Condition: Good, improved Recommended discharge diet: Cardiac diet  Consultations: None   Procedures/Studies: Echo- EF35-40% which is unchanged from prior echo in 2022  Allergies as of 09/15/2023   No Known  Allergies      Medication List     STOP taking these medications    aspirin  81 MG chewable tablet Replaced by: aspirin  EC 81 MG tablet   docusate sodium  100 MG capsule Commonly known as: COLACE   furosemide  40 MG tablet Commonly known as: LASIX    hydrALAZINE  10 MG tablet Commonly known as: APRESOLINE    ticagrelor  90 MG Tabs tablet Commonly known as: BRILINTA        TAKE these medications    acetaminophen  325 MG tablet Commonly known as: TYLENOL  Take 2 tablets (650 mg total) by mouth every 4 (four) hours as needed for mild pain (or temp > 37.5 C (99.5 F)).   aspirin  EC 81 MG tablet Take 1 tablet (81 mg total) by mouth daily. Swallow whole. Start taking on: September 16, 2023 Replaces: aspirin  81 MG chewable tablet   atorvastatin  80 MG tablet Commonly known as: LIPITOR  Take 1 tablet (80 mg total) by mouth daily.   baclofen  10 MG tablet Commonly known as: LIORESAL  Take 1 tablet (10 mg total) by mouth 3 (three) times daily as needed for muscle spasms.   carvedilol  3.125 MG tablet Commonly known as: COREG  Take 1 tablet (3.125 mg total) by mouth 2 (two) times daily with a meal.   spironolactone  25 MG tablet Commonly known as: ALDACTONE  Take 1 tablet (25 mg total) by mouth daily.   Torsemide  40 MG Tabs Take 40 mg by mouth 2 (two) times daily.       Subjective   Pt reports feeling much improved. Had leg cramping first night but improved and resolved today. No SOB at rest. Able to tolerate walking in  halls. Leg swelling much improved.   All questions and concerns were addressed at time of discharge.  Objective  Blood pressure (!) 98/51, pulse 67, temperature 99 F (37.2 C), resp. rate 20, height 6' (1.829 m), weight (!) 144.6 kg, SpO2 93%.   General: Pt is alert, awake, not in acute distress Cardiovascular: RRR, S1/S2 +, no rubs, no gallops Respiratory: CTA bilaterally, no wheezing, no rhonchi Abdominal: Soft, NT, ND, bowel sounds +. Significantly  obese Extremities: 1+ pitting edema bilateral LE and chronic venous stasis changes  The results of significant diagnostics from this hospitalization (including imaging, microbiology, ancillary and laboratory) are listed below for reference.   Imaging studies: ECHOCARDIOGRAM COMPLETE Result Date: 09/12/2023    ECHOCARDIOGRAM REPORT   Patient Name:   Joshua [name] Date of Exam: 09/12/2023 Medical Rec #:  [id number]   Height:       72.0 in Accession #:    [id number]  Weight:       290.0 lb Date of Birth:  [birth year]   BSA:          2.495 m Patient Age:    63 years    BP:           153/114 mmHg Patient Gender: M           HR:           77 bpm. Exam Location:  [facility] Procedure: 2D Echo (Both Spectral and Color Flow Doppler were utilized during            procedure). Indications:     acute systolic chf  History:         Patient has prior history of Echocardiogram examinations, most                  recent 01/10/2021. CHF, Prior CABG, Signs/Symptoms:Edema and                  Shortness of Breath; Risk Factors:Hypertension.  Sonographer:     Tinnie Barefoot RDCS Referring Phys:  [id number] DELON YATES Diagnosing Phys: Evalene Lunger MD  Sonographer Comments: Patient is obese. Image acquisition challenging due to patient body habitus. IMPRESSIONS  1. Left ventricular ejection fraction, by estimation, is 35 to 40%. The left ventricle has moderately decreased function. The left ventricle has no regional wall motion abnormalities. The left ventricular internal cavity size was mildly dilated. There is mild left ventricular hypertrophy. Left ventricular diastolic parameters are indeterminate.  2. Right ventricular systolic function is normal. The right ventricular size is normal. There is mildly elevated pulmonary artery systolic pressure. The estimated right ventricular systolic pressure is 39.4 mmHg.  3. The mitral valve is normal in structure. No evidence of mitral valve regurgitation. No evidence of mitral stenosis.  4. The  aortic valve is normal in structure. Aortic valve regurgitation is not visualized. Aortic valve sclerosis/calcification is present, without any evidence of aortic stenosis.  5. The inferior vena cava is normal in size with greater than 50% respiratory variability, suggesting right atrial pressure of 3 mmHg. FINDINGS  Left Ventricle: Left ventricular ejection fraction, by estimation, is 35 to 40%. The left ventricle has moderately decreased function. The left ventricle has no regional wall motion abnormalities. Strain was performed and the global longitudinal strain is indeterminate. The left ventricular internal cavity size was mildly dilated. There is mild left ventricular hypertrophy. Left ventricular diastolic parameters are indeterminate. Right Ventricle: The right ventricular size is normal. No increase in right ventricular wall thickness. Right ventricular  systolic function is normal. There is mildly elevated pulmonary artery systolic pressure. The tricuspid regurgitant velocity is 2.71  m/s, and with an assumed right atrial pressure of 10 mmHg, the estimated right ventricular systolic pressure is 39.4 mmHg. Left Atrium: Left atrial size was normal in size. Right Atrium: Right atrial size was normal in size. Pericardium: There is no evidence of pericardial effusion. Mitral Valve: The mitral valve is normal in structure. No evidence of mitral valve regurgitation. No evidence of mitral valve stenosis. Tricuspid Valve: The tricuspid valve is normal in structure. Tricuspid valve regurgitation is mild . No evidence of tricuspid stenosis. Aortic Valve: The aortic valve is normal in structure. Aortic valve regurgitation is not visualized. Aortic valve sclerosis/calcification is present, without any evidence of aortic stenosis. Pulmonic Valve: The pulmonic valve was normal in structure. Pulmonic valve regurgitation is not visualized. No evidence of pulmonic stenosis. Aorta: The aortic root is normal in size and  structure. Venous: The inferior vena cava is normal in size with greater than 50% respiratory variability, suggesting right atrial pressure of 3 mmHg. IAS/Shunts: No atrial level shunt detected by color flow Doppler. Additional Comments: 3D was performed not requiring image post processing on an independent workstation and was indeterminate.  LEFT VENTRICLE PLAX 2D LVIDd:         6.20 cm      Diastology LVIDs:         5.00 cm      LV e' medial:    5.55 cm/s LV PW:         1.20 cm      LV E/e' medial:  23.2 LV IVS:        1.20 cm      LV e' lateral:   8.81 cm/s LVOT diam:     1.90 cm      LV E/e' lateral: 14.6 LV SV:         52 LV SV Index:   21 LVOT Area:     2.84 cm  LV Volumes (MOD) LV vol d, MOD A2C: 198.0 ml LV vol s, MOD A2C: 126.0 ml LV SV MOD A2C:     72.0 ml RIGHT VENTRICLE            IVC RV Basal diam:  3.90 cm    IVC diam: 2.80 cm RV S prime:     7.83 cm/s TAPSE (M-mode): 1.3 cm LEFT ATRIUM             Index        RIGHT ATRIUM           Index LA diam:        4.70 cm 1.88 cm/m   RA Area:     26.90 cm LA Vol (A2C):   78.0 ml 31.26 ml/m  RA Volume:   97.20 ml  38.96 ml/m LA Vol (A4C):   67.1 ml 26.89 ml/m LA Biplane Vol: 72.3 ml 28.98 ml/m  AORTIC VALVE LVOT Vmax:   104.00 cm/s LVOT Vmean:  67.400 cm/s LVOT VTI:    0.184 m  AORTA Ao Root diam: 3.10 cm Ao Asc diam:  3.10 cm MITRAL VALVE                TRICUSPID VALVE MV Area (PHT): 5.02 cm     TR Peak grad:   29.4 mmHg MV Decel Time: 151 msec     TR Vmax:        271.00 cm/s MV E velocity: 129.00 cm/s  MV A velocity: 41.20 cm/s   SHUNTS MV E/A ratio:  3.13         Systemic VTI:  0.18 m                             Systemic Diam: 1.90 cm Evalene Lunger MD Electronically signed by Evalene Lunger MD Signature Date/Time: 09/12/2023/5:23:09 PM    Final    DG Chest 2 View Result Date: 09/12/2023 CLINICAL DATA:  Shortness of breath EXAM: CHEST - 2 VIEW COMPARISON:  01/09/2021 FINDINGS: Post sternotomy changes. Cardiomegaly with vascular congestion and mild  interstitial pulmonary edema. Small bilateral pleural effusions. More focal airspace disease at the right base. Aortic atherosclerosis. No pneumothorax IMPRESSION: 1. Cardiomegaly with vascular congestion and mild interstitial pulmonary edema. Small bilateral pleural effusions. 2. More focal airspace disease at the right base may be due to atelectasis or pneumonia. Electronically Signed   By: Luke Bun M.D.   On: 09/12/2023 16:10    Labs: Basic Metabolic Panel: Recent Labs  Lab 09/12/23 1202 09/13/23 0818 09/14/23 0453 09/15/23 0256  NA 140 137 137 135  K 4.1 3.7 3.3* 3.6  CL 108 103 100 96*  CO2 26 24 26 27   GLUCOSE 108* 103* 147* 119*  BUN 20 18 22  25*  CREATININE 1.25* 1.34* 1.42* 1.40*  CALCIUM  8.6* 8.3* 8.6* 8.9   CBC: Recent Labs  Lab 09/12/23 1202 09/13/23 0818  WBC 7.9 7.0  HGB 12.7* 12.2*  HCT 43.9 41.2  MCV 84.4 82.7  PLT 228 228   Microbiology: Results for orders placed or performed during the hospital encounter of 01/09/21  Resp Panel by RT-PCR (Flu A&B, Covid) Nasopharyngeal Swab     Status: None   Collection Time: 01/09/21  2:40 AM   Specimen: Nasopharyngeal Swab; Nasopharyngeal(NP) swabs in vial transport medium  Result Value Ref Range Status   SARS Coronavirus 2 by RT PCR NEGATIVE NEGATIVE Final    Comment: (NOTE) SARS-CoV-2 target nucleic acids are NOT DETECTED.  The SARS-CoV-2 RNA is generally detectable in upper respiratory specimens during the acute phase of infection. The lowest concentration of SARS-CoV-2 viral copies this assay can detect is 138 copies/mL. A negative result does not preclude SARS-Cov-2 infection and should not be used as the sole basis for treatment or other patient management decisions. A negative result may occur with  improper specimen collection/handling, submission of specimen other than nasopharyngeal swab, presence of viral mutation(s) within the areas targeted by this assay, and inadequate number of  viral copies(<138 copies/mL). A negative result must be combined with clinical observations, patient history, and epidemiological information. The expected result is Negative.  Fact Sheet for Patients:  BloggerCourse.com  Fact Sheet for Healthcare Providers:  SeriousBroker.it  This test is no t yet approved or cleared by the [place]  FDA and  has been authorized for detection and/or diagnosis of SARS-CoV-2 by FDA under an Emergency Use Authorization (EUA). This EUA will remain  in effect (meaning this test can be used) for the duration of the COVID-19 declaration under Section 564(b)(1) of the Act, 21 U.S.C.section 360bbb-3(b)(1), unless the authorization is terminated  or revoked sooner.       Influenza A by PCR NEGATIVE NEGATIVE Final   Influenza B by PCR NEGATIVE NEGATIVE Final    Comment: (NOTE) The Xpert Xpress SARS-CoV-2/FLU/RSV plus assay is intended as an aid in the diagnosis of influenza from Nasopharyngeal swab specimens and should not be used  as a sole basis for treatment. Nasal washings and aspirates are unacceptable for Xpert Xpress SARS-CoV-2/FLU/RSV testing.  Fact Sheet for Patients: BloggerCourse.com  Fact Sheet for Healthcare Providers: SeriousBroker.it  This test is not yet approved or cleared by the [place]  FDA and has been authorized for detection and/or diagnosis of SARS-CoV-2 by FDA under an Emergency Use Authorization (EUA). This EUA will remain in effect (meaning this test can be used) for the duration of the COVID-19 declaration under Section 564(b)(1) of the Act, 21 U.S.C. section 360bbb-3(b)(1), unless the authorization is terminated or revoked.  Performed at [facility], [address] [place], [place] [address]     Time coordinating discharge: Over 30 minutes  Marien LITTIE Piety, MD  [facility] 09/15/2023,  9:37 AM

## 2023-09-15 NOTE — Progress Notes (Incomplete)
 PROGRESS NOTE Joshua [name]    DOB: April 02, 1960, 63 y.o.  FMW:[id number]    Code Status: Limited: Do not attempt resuscitation (DNR) -DNR-LIMITED -Do Not Intubate/DNI    DOA: 09/12/2023   LOS: 2  Brief hospital course  Joshua [name] is a 63 y.o. male with medical history significant of CAD s/p CABG, chronic combined CHF (EF 35-40% and grade 1 DD in 12/2020), and HTN who presented on 6/20 with anasarca with CHF exacerbation secondary to inability to afford CHF medications with loss of his job and insurance  09/15/23 - still significantly fluid overloaded despite IV diuresis and good UOP. Will require ongoing IV diuresis and monitoring. Desat to 80s with ambulation. Will reevaluate again tomorrow to assess whether he needs O2 going home.   Assessment & Plan  Principal Problem:   Acute on chronic combined systolic (congestive) and diastolic (congestive) heart failure (HCC) Active Problems:   Essential hypertension, benign   Class 2 obesity due to excess calories with body mass index (BMI) of 39.0 to 39.9 in adult   S/P CABG x 4   Dyslipidemia   DNR (do not resuscitate)   CHF exacerbation (HCC)  Acute on chronic combined CHF -CXR consistent with mild pulmonary edema Mildly elevated BNP. Echo- EF 35-40% - continue IV diuresis, will need oral diuretic plan and prescription at dc - daily weights - strict I/O - mobilize - leg compression  Resume carvedilol , spironolactone  On RA at this time at rest - ambulate with pulse ox shows hypoxia- will attempt again tomorrow.  - had slight increase in creatinine- decreasing dose of lasix  and will recheck Cr tomorrow  Mild hypokalemia- as result of fluid shift/diuresis. S/p replacement - BMP am and replace as needed   LE cramping overnight- eating mustard.  Trial of muscle relaxers  HTN Continue/resume carvedilol    CAD  HLD He appears to have been lost to f/u since his 12/2020 hospitalization No longer taking Brilinta  Continue/resume Lipitor     Class 2 Obesity Body mass index is 39.33 kg/m.SABRA  Weight loss should be encouraged Outpatient PCP/bariatric medicine f/u encouraged Significantly low or high BMI is associated with higher medical risk including morbidity and mortality     DNR I have discussed code status with the patient and he would not desire resuscitation and would prefer to die a natural death should that situation arise.  Body mass index is 43.24 kg/m.  VTE ppx: Place TED hose Start: 09/14/23 [id number]  Diet:     Diet   Diet Heart Room service appropriate? Yes; Fluid consistency: Thin; Fluid restriction: 1500 mL Fluid   Consultants: None   Subjective 09/15/23    Pt reports SOB with ambulating. Leg swelling improved but still painful. Describes significant calf cramping overnight improved with eating mustard.    Objective  Blood pressure (!) 129/98, pulse 70, temperature 97.7 F (36.5 C), temperature source Oral, resp. rate 20, height 6' (1.829 m), weight 131.7 kg, SpO2 95%.  Intake/Output Summary (Last 24 hours) at 09/15/2023 9191 Last data filed at 09/15/2023 0500 Gross per 24 hour  Intake 603 ml  Output 450 ml  Net 153 ml   Filed Weights   09/13/23 0500 09/14/23 0500 09/15/23 0356  Weight: 131.7 kg (!) 146.7 kg (!) 144.6 kg     Physical Exam:  General: awake, alert, NAD HEENT: atraumatic, clear conjunctiva, anicteric sclera, MMM, hearing grossly normal Respiratory: out of breath with conversation, obese abdomen probably contributing as well as rales/crackles observed Cardiovascular: quick capillary refill, normal S1/S2,  RRR, no JVD, murmurs Gastrointestinal: soft, NT, ND. obese Nervous: A&O x3. no gross focal neurologic deficits Extremities: 3+ pitting edema bilateral LE Skin: dry, intact, normal temperature, normal color. No rashes, lesions or ulcers on exposed skin Psychiatry: normal mood, congruent affect  Labs   I have personally reviewed the following labs and imaging studies CBC     Component Value Date/Time   WBC 7.0 09/13/2023 0818   RBC 4.98 09/13/2023 0818   HGB 12.2 (L) 09/13/2023 0818   HGB 15.9 04/07/2013 0642   HCT 41.2 09/13/2023 0818   HCT 47.1 04/07/2013 0642   PLT 228 09/13/2023 0818   PLT 228 04/07/2013 0642   MCV 82.7 09/13/2023 0818   MCV 84 04/07/2013 0642   MCH 24.5 (L) 09/13/2023 0818   MCHC 29.6 (L) 09/13/2023 0818   RDW 20.9 (H) 09/13/2023 0818   RDW 13.7 04/07/2013 0642   LYMPHSABS 1.8 01/15/2021 0518   LYMPHSABS 1.6 04/07/2013 0642   MONOABS 0.8 01/15/2021 0518   MONOABS 0.6 04/07/2013 0642   EOSABS 0.1 01/15/2021 0518   EOSABS 0.0 04/07/2013 0642   BASOSABS 0.0 01/15/2021 0518   BASOSABS 0.1 04/07/2013 0642      Latest Ref Rng & Units 09/15/2023    2:56 AM 09/14/2023    4:53 AM 09/13/2023    8:18 AM  BMP  Glucose 70 - 99 mg/dL 880  852  896   BUN 8 - 23 mg/dL 25  22  18    Creatinine 0.61 - 1.24 mg/dL 8.59  8.57  8.65   Sodium 135 - 145 mmol/L 135  137  137   Potassium 3.5 - 5.1 mmol/L 3.6  3.3  3.7   Chloride 98 - 111 mmol/L 96  100  103   CO2 22 - 32 mmol/L 27  26  24    Calcium  8.9 - 10.3 mg/dL 8.9  8.6  8.3     No results found.   Disposition Plan & Communication  Patient status: Inpatient  Admitted From: Home Planned disposition location: Home Anticipated discharge date: 6/23 pending clinical improvement   Family Communication: none    Author: Marien LITTIE Piety, DO [facility] 09/15/2023, 8:08 AM   Available by Epic secure chat 7AM-7PM. If 7PM-7AM, please contact night-coverage.  [facility] contact information found on [email or web address].

## 2023-09-15 NOTE — Progress Notes (Signed)
 Heart Failure Stewardship Pharmacy Note  PCP: Bluford Jacqulyn MATSU, DO PCP-Cardiologist: None  HPI: Joshua [name] is a 63 y.o. male with CAD s/p CABG, chronic combined CHF (EF 35-40% and grade 1 DD in 12/2020), and HTN who presented with anasarca. Patient has been nonadherent to medications because of cost. On admission, BNP was 638.6, HS-troponin was 29. Chest x-ray noted vascular congestion, interstitial pulmonary edema, and small bilateral pleural effusion.   Pertinent cardiac history: STEMI in 03/2013 where LHC showed significant 3-vessel disease, referred for surgical evaluation and underwent CABG x 4 later that month. Echo in 03/2013 noted LVEF of 55-60%. Intra-op TEE noted LVEFf of 45-50%. Echo in 06/2016 showed LVEF of 20-25%, mild LVH. LHC in 07/2016 showed diffuse native CAD with widely patent grafts. RHC showed preserved cardiac output. Echo 12/2016 showed LVEF 35-40%. CMRI at that time showed LGE in 6 segments with poor chance of recovery with revascularization. Echo this admission with LVEF 35-40%.   Pertinent Lab Values: Creatinine  Date Value Ref Range Status  04/07/2013 1.17 0.60 - 1.30 mg/dL Final   Creatinine, Ser  Date Value Ref Range Status  09/15/2023 1.40 (H) 0.61 - 1.24 mg/dL Final   BUN  Date Value Ref Range Status  09/15/2023 25 (H) 8 - 23 mg/dL Final  92/94/7981 29 (H) 6 - 24 mg/dL Final  98/85/7984 18 7 - 18 mg/dL Final   Potassium  Date Value Ref Range Status  09/15/2023 3.6 3.5 - 5.1 mmol/L Final  04/07/2013 3.8 3.5 - 5.1 mmol/L Final   Sodium  Date Value Ref Range Status  09/15/2023 135 135 - 145 mmol/L Final  09/26/2016 139 134 - 144 mmol/L Final  04/07/2013 137 136 - 145 mmol/L Final   B Natriuretic Peptide  Date Value Ref Range Status  09/12/2023 638.6 (H) 0.0 - 100.0 pg/mL Final    Comment:    Performed at [facility], [address] [place], [place] [address]   Magnesium   Date Value Ref Range Status  09/09/2016 2.0 1.7 - 2.4 mg/dL  Final   Hgb J8r MFr Bld  Date Value Ref Range Status  01/10/2021 5.3 4.8 - 5.6 % Final    Comment:    (NOTE) Pre diabetes:          5.7%-6.4%  Diabetes:              >6.4%  Glycemic control for   <7.0% adults with diabetes     Vital Signs: Temp:  [97.7 F (36.5 C)-99 F (37.2 C)] 99 F (37.2 C) (06/23 0737) Pulse Rate:  [67-74] 67 (06/23 0737) Resp:  [16-20] 20 (06/23 0737) BP: (98-132)/(51-82) 98/51 (06/23 0737) SpO2:  [90 %-99 %] 93 % (06/23 0737) Weight:  [144.6 kg (318 lb 12.6 oz)] 144.6 kg (318 lb 12.6 oz) (06/23 0356)  Intake/Output Summary (Last 24 hours) at 09/15/2023 0857 Last data filed at 09/15/2023 0500 Gross per 24 hour  Intake 603 ml  Output 450 ml  Net 153 ml    Current Heart Failure Medications:  Loop diuretic: furosemide  40 mg IV BID Beta-Blocker: carvedilol  3.125 mg BID ACEI/ARB/ARNI: none MRA: spironolactone  25 mg daily SGLT2i: none Other:  Prior to admission Heart Failure Medications:  None  Assessment: 1. Acute on chronic combined systolic and diastolic heart failure (LVEF 35-40%)  , due to ICM. NYHA class III symptoms.  -Symptoms: Denies shortness of breath. Appetite ok. Only complaint is some LEE and leg pain with ambulation. -Volume: Trace LEE. Appears euvolemic. Creatinine  is stable with BUN trending up and Cl trending down. Currently on furosemide  40 mg IV BID. Torsemide 40 mg daily ordered for discharge. -Hemodynamics: BP stable aside from one soft reading this AM. HR 70s. -BB: Continue carvedilol  3.125 mg BID. Would not increase at this time with low BP reading this AM. -ACEI/ARB/ARNI: BP too soft to add at this time. -MRA: Continue spironolactone  25 mg daily. -SGLT2i: Unable to afford SGLT2i at this time. -The patient is apathetic towards his CHF and medication therapy. He is not likely to take his medications at discharge.    Plan: 1) Medication changes recommended at this time: -None  2) Patient assistance: -Patient is  uninsured. Medication management via [facility] pharmacy.  3) Education: - Patient has been educated on current HF medications and potential additions to HF medication regimen - Patient verbalizes understanding that over the next few months, these medication doses may change and more medications may be added to optimize HF regimen - Patient has been educated on basic disease state pathophysiology and goals of therapy  Medication Assistance / Insurance Benefits Check: Does the patient have prescription insurance?    Type of insurance plan:  Does the patient qualify for medication assistance through manufacturers or grants? Pending   Outpatient Pharmacy: Prior to admission outpatient pharmacy: none      Please do not hesitate to reach out with questions or concerns,  Jaun Bash, PharmD, CPP, BCPS Heart Failure Pharmacist  Phone - [phone] 09/15/2023 11:48 AM

## 2023-09-19 ENCOUNTER — Encounter (HOSPITAL_COMMUNITY): Payer: Self-pay | Admitting: Interventional Radiology
